# Patient Record
Sex: Male | Born: 1955 | Race: White | Hispanic: No | Marital: Single | Smoking: Former smoker
Health system: Southern US, Community
[De-identification: ages and names within clinical notes are randomized; demographics above are authoritative.]

## PROBLEM LIST (undated history)

## (undated) DIAGNOSIS — I2510 Atherosclerotic heart disease of native coronary artery without angina pectoris: Secondary | ICD-10-CM

## (undated) DIAGNOSIS — I219 Acute myocardial infarction, unspecified: Secondary | ICD-10-CM

## (undated) DIAGNOSIS — M069 Rheumatoid arthritis, unspecified: Secondary | ICD-10-CM

## (undated) DIAGNOSIS — E039 Hypothyroidism, unspecified: Secondary | ICD-10-CM

## (undated) DIAGNOSIS — I509 Heart failure, unspecified: Secondary | ICD-10-CM

## (undated) DIAGNOSIS — M1990 Unspecified osteoarthritis, unspecified site: Secondary | ICD-10-CM

## (undated) DIAGNOSIS — K219 Gastro-esophageal reflux disease without esophagitis: Secondary | ICD-10-CM

## (undated) HISTORY — DX: Acute myocardial infarction, unspecified: I21.9

## (undated) HISTORY — DX: Hypothyroidism, unspecified: E03.9

## (undated) HISTORY — DX: Rheumatoid arthritis, unspecified: M06.9

## (undated) HISTORY — PX: LEG SURGERY: SHX1003

## (undated) HISTORY — DX: Heart failure, unspecified: I50.9

---

## 2000-09-19 ENCOUNTER — Other Ambulatory Visit: Admission: RE | Admit: 2000-09-19 | Discharge: 2000-09-19 | Payer: Self-pay | Admitting: Internal Medicine

## 2005-07-30 ENCOUNTER — Emergency Department (HOSPITAL_COMMUNITY): Admission: EM | Admit: 2005-07-30 | Discharge: 2005-07-31 | Payer: Self-pay | Admitting: Emergency Medicine

## 2006-03-28 ENCOUNTER — Encounter: Admission: RE | Admit: 2006-03-28 | Discharge: 2006-03-28 | Payer: Self-pay | Admitting: Internal Medicine

## 2014-03-18 ENCOUNTER — Other Ambulatory Visit (HOSPITAL_COMMUNITY): Payer: Self-pay | Admitting: Internal Medicine

## 2014-03-18 DIAGNOSIS — R1310 Dysphagia, unspecified: Secondary | ICD-10-CM

## 2014-03-22 ENCOUNTER — Ambulatory Visit (HOSPITAL_COMMUNITY)
Admission: RE | Admit: 2014-03-22 | Discharge: 2014-03-22 | Disposition: A | Discharge status: Home / Self Care | Payer: BLUE CROSS/BLUE SHIELD | Source: Ambulatory Visit | Attending: Internal Medicine | Admitting: Internal Medicine

## 2014-03-22 DIAGNOSIS — R1310 Dysphagia, unspecified: Secondary | ICD-10-CM | POA: Insufficient documentation

## 2014-03-28 ENCOUNTER — Encounter: Payer: Self-pay | Admitting: Internal Medicine

## 2014-04-13 ENCOUNTER — Encounter: Payer: Self-pay | Admitting: Gastroenterology

## 2014-04-13 ENCOUNTER — Other Ambulatory Visit: Payer: Self-pay

## 2014-04-13 ENCOUNTER — Ambulatory Visit (INDEPENDENT_AMBULATORY_CARE_PROVIDER_SITE_OTHER): Payer: BLUE CROSS/BLUE SHIELD | Admitting: Gastroenterology

## 2014-04-13 VITALS — BP 156/61 | HR 71 | Temp 97.3°F | Ht 72.0 in | Wt 199.6 lb

## 2014-04-13 DIAGNOSIS — R1319 Other dysphagia: Secondary | ICD-10-CM

## 2014-04-13 DIAGNOSIS — Z1211 Encounter for screening for malignant neoplasm of colon: Secondary | ICD-10-CM

## 2014-04-13 DIAGNOSIS — R1310 Dysphagia, unspecified: Secondary | ICD-10-CM

## 2014-04-13 DIAGNOSIS — R1314 Dysphagia, pharyngoesophageal phase: Secondary | ICD-10-CM

## 2014-04-13 DIAGNOSIS — K222 Esophageal obstruction: Secondary | ICD-10-CM

## 2014-04-13 MED ORDER — PEG 3350-KCL-NA BICARB-NACL 420 G PO SOLR: 4000.0000 mL | Freq: Once | ORAL | Status: DC

## 2014-04-13 NOTE — Assessment & Plan Note (Signed)
59 year old gentleman with history of solid food esophageal dysphagia with impactions at times. Barium pill esophagram showed benign distal esophageal stricture. Recommend EGD with dilation in the near future.  I have discussed the risks, alternatives, benefits with regards to but not limited to the risk of reaction to medication, bleeding, infection, perforation and the patient is agreeable to proceed. Written consent to be obtained.  Continue pantoprazole 40 mg for now. In the meantime consume soft diet.

## 2014-04-13 NOTE — Patient Instructions (Signed)
1. Colonoscopy and upper endoscopy with Dr. Rourk. Please see separate instructions.  

## 2014-04-13 NOTE — Assessment & Plan Note (Signed)
Due for first ever screening colonoscopy. Complains of rare bright red blood per rectum which he feels is hemorrhoid related.  I have discussed the risks, alternatives, benefits with regards to but not limited to the risk of reaction to medication, bleeding, infection, perforation and the patient is agreeable to proceed. Written consent to be obtained.

## 2014-04-13 NOTE — Progress Notes (Signed)
cc'ed to pcp °
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
## 2014-05-11 ENCOUNTER — Encounter (HOSPITAL_COMMUNITY)
Admission: RE | Disposition: A | Discharge status: Home / Self Care | Payer: Self-pay | Source: Ambulatory Visit | Attending: Internal Medicine

## 2014-05-11 ENCOUNTER — Encounter (HOSPITAL_COMMUNITY): Payer: Self-pay | Admitting: *Deleted

## 2014-05-11 ENCOUNTER — Ambulatory Visit (HOSPITAL_COMMUNITY)
Admission: RE | Admit: 2014-05-11 | Discharge: 2014-05-11 | Disposition: A | Discharge status: Home / Self Care | Payer: BLUE CROSS/BLUE SHIELD | Source: Ambulatory Visit | Attending: Internal Medicine | Admitting: Internal Medicine

## 2014-05-11 DIAGNOSIS — K2100 Gastro-esophageal reflux disease with esophagitis, without bleeding: Secondary | ICD-10-CM | POA: Insufficient documentation

## 2014-05-11 DIAGNOSIS — Z1211 Encounter for screening for malignant neoplasm of colon: Secondary | ICD-10-CM | POA: Diagnosis not present

## 2014-05-11 DIAGNOSIS — Z860100 Personal history of colon polyps, unspecified: Secondary | ICD-10-CM | POA: Insufficient documentation

## 2014-05-11 DIAGNOSIS — K5730 Diverticulosis of large intestine without perforation or abscess without bleeding: Secondary | ICD-10-CM | POA: Insufficient documentation

## 2014-05-11 DIAGNOSIS — R1310 Dysphagia, unspecified: Secondary | ICD-10-CM

## 2014-05-11 DIAGNOSIS — K449 Diaphragmatic hernia without obstruction or gangrene: Secondary | ICD-10-CM | POA: Insufficient documentation

## 2014-05-11 DIAGNOSIS — Z87891 Personal history of nicotine dependence: Secondary | ICD-10-CM | POA: Insufficient documentation

## 2014-05-11 DIAGNOSIS — K621 Rectal polyp: Secondary | ICD-10-CM

## 2014-05-11 DIAGNOSIS — M069 Rheumatoid arthritis, unspecified: Secondary | ICD-10-CM | POA: Diagnosis not present

## 2014-05-11 DIAGNOSIS — K222 Esophageal obstruction: Secondary | ICD-10-CM | POA: Diagnosis not present

## 2014-05-11 DIAGNOSIS — K219 Gastro-esophageal reflux disease without esophagitis: Secondary | ICD-10-CM | POA: Diagnosis present

## 2014-05-11 DIAGNOSIS — R1319 Other dysphagia: Secondary | ICD-10-CM

## 2014-05-11 HISTORY — PX: COLONOSCOPY: SHX5424

## 2014-05-11 HISTORY — PX: ESOPHAGOGASTRODUODENOSCOPY: SHX5428

## 2014-05-11 HISTORY — PX: ESOPHAGEAL DILATION: SHX303

## 2014-05-11 SURGERY — COLONOSCOPY
Anesthesia: Moderate Sedation

## 2014-05-11 MED ORDER — LIDOCAINE VISCOUS 2 % MT SOLN
OROMUCOSAL | Status: DC | PRN
  Administered 2014-05-11: 3 mL via OROMUCOSAL

## 2014-05-11 MED ORDER — MEPERIDINE HCL 100 MG/ML IJ SOLN
INTRAMUSCULAR | Status: DC | PRN
  Administered 2014-05-11: 25 mg via INTRAVENOUS
  Administered 2014-05-11: 50 mg via INTRAVENOUS

## 2014-05-11 MED ORDER — MIDAZOLAM HCL 5 MG/5ML IJ SOLN
INTRAMUSCULAR | Status: DC | PRN
  Administered 2014-05-11: 1 mg via INTRAVENOUS
  Administered 2014-05-11: 2 mg via INTRAVENOUS
  Administered 2014-05-11 (×2): 1 mg via INTRAVENOUS
  Administered 2014-05-11: 2 mg via INTRAVENOUS

## 2014-05-11 MED ORDER — ONDANSETRON HCL 4 MG/2ML IJ SOLN
INTRAMUSCULAR | Status: AC
  Filled 2014-05-11: qty 2

## 2014-05-11 MED ORDER — LIDOCAINE VISCOUS 2 % MT SOLN
OROMUCOSAL | Status: AC
  Filled 2014-05-11: qty 15

## 2014-05-11 MED ORDER — MIDAZOLAM HCL 5 MG/5ML IJ SOLN
INTRAMUSCULAR | Status: AC
  Filled 2014-05-11: qty 10

## 2014-05-11 MED ORDER — ONDANSETRON HCL 4 MG/2ML IJ SOLN
INTRAMUSCULAR | Status: DC | PRN
  Administered 2014-05-11: 4 mg via INTRAVENOUS

## 2014-05-11 MED ORDER — MEPERIDINE HCL 100 MG/ML IJ SOLN
INTRAMUSCULAR | Status: AC
  Filled 2014-05-11: qty 2

## 2014-05-11 MED ORDER — SODIUM CHLORIDE 0.9 % IV SOLN
INTRAVENOUS | Status: DC
Start: 2014-05-11 — End: 2014-05-11
  Administered 2014-05-11: 1000 mL via INTRAVENOUS
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
## 2014-05-11 NOTE — Discharge Instructions (Signed)
Colonoscopy Discharge Instructions  Read the instructions outlined below and refer to this sheet in the next few weeks. These discharge instructions provide you with general information on caring for yourself after you leave the hospital. Your doctor may also give you specific instructions. While your treatment has been planned according to the most current medical practices available, unavoidable complications occasionally occur. If you have any problems or questions after discharge, call Dr. Gala Romney at [phone]. ACTIVITY  You may resume your regular activity, but move at a slower pace for the next 24 hours.   Take frequent rest periods for the next 24 hours.   Walking will help get rid of the air and reduce the bloated feeling in your belly (abdomen).   No driving for 24 hours (because of the medicine (anesthesia) used during the test).    Do not sign any important legal documents or operate any machinery for 24 hours (because of the anesthesia used during the test).  NUTRITION  Drink plenty of fluids.   You may resume your normal diet as instructed by your doctor.   Begin with a light meal and progress to your normal diet. Heavy or fried foods are harder to digest and may make you feel sick to your stomach (nauseated).   Avoid alcoholic beverages for 24 hours or as instructed.  MEDICATIONS  You may resume your normal medications unless your doctor tells you otherwise.  WHAT YOU CAN EXPECT TODAY  Some feelings of bloating in the abdomen.   Passage of more gas than usual.   Spotting of blood in your stool or on the toilet paper.  IF YOU HAD POLYPS REMOVED DURING THE COLONOSCOPY:  No aspirin products for 7 days or as instructed.   No alcohol for 7 days or as instructed.   Eat a soft diet for the next 24 hours.  FINDING OUT THE RESULTS OF YOUR TEST Not all test results are available during your visit. If your test results are not back during the visit, make an appointment  with your caregiver to find out the results. Do not assume everything is normal if you have not heard from your caregiver or the medical facility. It is important for you to follow up on all of your test results.  SEEK IMMEDIATE MEDICAL ATTENTION IF:  You have more than a spotting of blood in your stool.   Your belly is swollen (abdominal distention).   You are nauseated or vomiting.   You have a temperature over 101.  You have abdominal pain or discomfort that is severe or gets worse throughout the day. EGD Discharge instructions Please read the instructions outlined below and refer to this sheet in the next few weeks. These discharge instructions provide you with general information on caring for yourself after you leave the hospital. Your doctor may also give you specific instructions. While your treatment has been planned according to the most current medical practices available, unavoidable complications occasionally occur. If you have any problems or questions after discharge, please call your doctor. ACTIVITY You may resume your regular activity but move at a slower pace for the next 24 hours.  Take frequent rest periods for the next 24 hours.  Walking will help expel (get rid of) the air and reduce the bloated feeling in your abdomen.  No driving for 24 hours (because of the anesthesia (medicine) used during the test).  You may shower.  Do not sign any important legal documents or operate any machinery for 24  hours (because of the anesthesia used during the test).  NUTRITION Drink plenty of fluids.  You may resume your normal diet.  Begin with a light meal and progress to your normal diet.  Avoid alcoholic beverages for 24 hours or as instructed by your caregiver.  MEDICATIONS You may resume your normal medications unless your caregiver tells you otherwise.  WHAT YOU CAN EXPECT TODAY You may experience abdominal discomfort such as a feeling of fullness or gas pains.   FOLLOW-UP Your doctor will discuss the results of your test with you.  SEEK IMMEDIATE MEDICAL ATTENTION IF ANY OF THE FOLLOWING OCCUR: Excessive nausea (feeling sick to your stomach) and/or vomiting.  Severe abdominal pain and distention (swelling).  Trouble swallowing.  Temperature over 101 F (37.8 C).  Rectal bleeding or vomiting of blood.     You had 2 small polyps removed during your colonoscopy  You have severe acid reflux disease with stricture narrowing in your esophagus which I dilated. You need to start a medication called Protonix 40 mg daily. Your stomach was also inflamed.  Further recommendations to follow once I get the biopsy report back in about a week to 10 days.  Office visit with [place] in 3 months  GERD information provided.    Gastroesophageal Reflux Disease, Adult Gastroesophageal reflux disease (GERD) happens when acid from your stomach flows up into the esophagus. When acid comes in contact with the esophagus, the acid causes soreness (inflammation) in the esophagus. Over time, GERD may create small holes (ulcers) in the lining of the esophagus. CAUSES   Increased body weight. This puts pressure on the stomach, making acid rise from the stomach into the esophagus.  Smoking. This increases acid production in the stomach.  Drinking alcohol. This causes decreased pressure in the lower esophageal sphincter (valve or ring of muscle between the esophagus and stomach), allowing acid from the stomach into the esophagus.  Late evening meals and a full stomach. This increases pressure and acid production in the stomach.  A malformed lower esophageal sphincter. Sometimes, no cause is found. SYMPTOMS   Burning pain in the lower part of the mid-chest behind the breastbone and in the mid-stomach area. This may occur twice a week or more often.  Trouble swallowing.  Sore throat.  Dry cough.  Asthma-like symptoms including chest tightness, shortness of breath, or  wheezing. DIAGNOSIS  Your caregiver may be able to diagnose GERD based on your symptoms. In some cases, X-rays and other tests may be done to check for complications or to check the condition of your stomach and esophagus. TREATMENT  Your caregiver may recommend over-the-counter or prescription medicines to help decrease acid production. Ask your caregiver before starting or adding any new medicines.  HOME CARE INSTRUCTIONS   Change the factors that you can control. Ask your caregiver for guidance concerning weight loss, quitting smoking, and alcohol consumption.  Avoid foods and drinks that make your symptoms worse, such as:  Caffeine or alcoholic drinks.  Chocolate.  Peppermint or mint flavorings.  Garlic and onions.  Spicy foods.  Citrus fruits, such as oranges, lemons, or limes.  Tomato-based foods such as sauce, chili, salsa, and pizza.  Fried and fatty foods.  Avoid lying down for the 3 hours prior to your bedtime or prior to taking a nap.  Eat small, frequent meals instead of large meals.  Wear loose-fitting clothing. Do not wear anything tight around your waist that causes pressure on your stomach.  Raise the head of your  bed 6 to 8 inches with wood blocks to help you sleep. Extra pillows will not help.  Only take over-the-counter or prescription medicines for pain, discomfort, or fever as directed by your caregiver.  Do not take aspirin, ibuprofen, or other nonsteroidal anti-inflammatory drugs (NSAIDs). SEEK IMMEDIATE MEDICAL CARE IF:   You have pain in your arms, neck, jaw, teeth, or back.  Your pain increases or changes in intensity or duration.  You develop nausea, vomiting, or sweating (diaphoresis).  You develop shortness of breath, or you faint.  Your vomit is green, yellow, black, or looks like coffee grounds or blood.  Your stool is red, bloody, or black. These symptoms could be signs of other problems, such as heart disease, gastric bleeding, or  esophageal bleeding. MAKE SURE YOU:   Understand these instructions.  Will watch your condition.  Will get help right away if you are not doing well or get worse. Document Released: 10/31/2004 Document Revised: 04/15/2011 Document Reviewed: 08/10/2010 [facility] Patient Information 2015 [place], [place]. This information is not intended to replace advice given to you by your health care provider. Make sure you discuss any questions you have with your health care provider.   Colon Polyps Polyps are lumps of extra tissue growing inside the body. Polyps can grow in the large intestine (colon). Most colon polyps are noncancerous (benign). However, some colon polyps can become cancerous over time. Polyps that are larger than a pea may be harmful. To be safe, caregivers remove and test all polyps. CAUSES  Polyps form when mutations in the genes cause your cells to grow and divide even though no more tissue is needed. RISK FACTORS There are a number of risk factors that can increase your chances of getting colon polyps. They include:  Being older than 50 years.  Family history of colon polyps or colon cancer.  Long-term colon diseases, such as colitis or Crohn disease.  Being overweight.  Smoking.  Being inactive.  Drinking too much alcohol. SYMPTOMS  Most small polyps do not cause symptoms. If symptoms are present, they may include:  Blood in the stool. The stool may look dark red or black.  Constipation or diarrhea that lasts longer than 1 week. DIAGNOSIS People often do not know they have polyps until their caregiver finds them during a regular checkup. Your caregiver can use 4 tests to check for polyps:  Digital rectal exam. The caregiver wears gloves and feels inside the rectum. This test would find polyps only in the rectum.  Barium enema. The caregiver puts a liquid called barium into your rectum before taking X-rays of your colon. Barium makes your colon look white. Polyps  are dark, so they are easy to see in the X-ray pictures.  Sigmoidoscopy. A thin, flexible tube (sigmoidoscope) is placed into your rectum. The sigmoidoscope has a light and tiny camera in it. The caregiver uses the sigmoidoscope to look at the last third of your colon.  Colonoscopy. This test is like sigmoidoscopy, but the caregiver looks at the entire colon. This is the most common method for finding and removing polyps. TREATMENT  Any polyps will be removed during a sigmoidoscopy or colonoscopy. The polyps are then tested for cancer. PREVENTION  To help lower your risk of getting more colon polyps:  Eat plenty of fruits and vegetables. Avoid eating fatty foods.  Do not smoke.  Avoid drinking alcohol.  Exercise every day.  Lose weight if recommended by your caregiver.  Eat plenty of calcium and folate. Foods  that are rich in calcium include milk, cheese, and broccoli. Foods that are rich in folate include chickpeas, kidney beans, and spinach. HOME CARE INSTRUCTIONS Keep all follow-up appointments as directed by your caregiver. You may need periodic exams to check for polyps. SEEK MEDICAL CARE IF: You notice bleeding during a bowel movement. Document Released: 10/18/2003 Document Revised: 04/15/2011 Document Reviewed: 04/02/2011 [facility] Patient Information 2015 [place], [place]. This information is not intended to replace advice given to you by your health care provider. Make sure you discuss any questions you have with your health care provider.

## 2014-05-11 NOTE — Interval H&P Note (Signed)
History and Physical Interval Note:  05/11/2014 11:04 AM  Brendan [name]  has presented today for surgery, with the diagnosis of esophageal dysphagia, screening TCS, Esphogeal stricture  The various methods of treatment have been discussed with the patient and family. After consideration of risks, benefits and other options for treatment, the patient has consented to  Procedure(s) with comments: COLONOSCOPY (N/A) - 1045am - moved to 11:00 - Ginger to notify pt ESOPHAGOGASTRODUODENOSCOPY (EGD) (N/A) ESOPHAGEAL DILATION (N/A) as a surgical intervention .  The patient's history has been reviewed, patient examined, no change in status, stable for surgery.  I have reviewed the patient's chart and labs.  Questions were answered to the patient's satisfaction.     [name]  No change. Abnormal barium pill esophagram. EGD with esophageal dilation as appropriate followed by screening colonoscopy.  The risks, benefits, limitations, imponderables and alternatives regarding both EGD and colonoscopy have been reviewed with the patient. Questions have been answered. All parties agreeable.

## 2014-05-11 NOTE — Op Note (Signed)
[facility] [address] [place], [address]   COLONOSCOPY PROCEDURE REPORT  PATIENT: Brendan [name], Brendan [name]  MR#: [id number] BIRTHDATE: 09-28-1955 , 58  yrs. old GENDER: male ENDOSCOPIST: R.  Roetta Sessions, MD FACP [place] REFERRED QQ:IWLNL Sherwood Gambler, M.D. PROCEDURE DATE:  07-Jun-2014 PROCEDURE:   Colonoscopy with biopsy INDICATIONS:   first-ever average risk colorectal cancer screening examination. MEDICATIONS: Versed 7 mg IV and Demerol 75 mg IV in divided doses. Zofran 4 mg IV. ASA CLASS:       Class II  CONSENT: The risks, benefits, alternatives and imponderables including but not limited to bleeding, perforation as well as the possibility of a missed lesion have been reviewed.  The potential for biopsy, lesion removal, etc. have also been discussed. Questions have been answered.  All parties agreeable.  Please see the history and physical in the medical record for more information.  DESCRIPTION OF PROCEDURE:   After the risks benefits and alternatives of the procedure were thoroughly explained, informed consent was obtained.  The digital rectal exam revealed no abnormalities of the rectum.   The EG-2990i ([id number])  endoscope was introduced through the anus and advanced to the cecum, which was identified by both the appendix and ileocecal valve. No adverse events experienced.   The quality of the prep was adequate  The instrument was then slowly withdrawn as the colon was fully examined.      COLON FINDINGS: 2 distal diminutive rectal polyp; otherwise the rectal mucosa appeared normal.  Single everted descending colon diverticulum; otherwise, the remainder of the colonic mucosa appeared normal.  The 2 above-mentioned polyps were cold biopsy/removed.  Retroflexion was performed. .  Withdrawal time=10 minutes 0 seconds.  The scope was withdrawn and the procedure completed. COMPLICATIONS: There were no immediate complications.  ENDOSCOPIC IMPRESSION: Rectal  polyps?"removed as described above. Single colonic diverticulum  RECOMMENDATIONS: Follow-up on pathology. See EGD report.  eSigned:  R. Roetta Sessions, MD Jerrel Ivory [facility] 2014/06/07 11:55 AM   cc:  CPT CODES: ICD CODES:  The ICD and CPT codes recommended by this software are interpretations from the data that the clinical staff has captured with the software.  The verification of the translation of this report to the ICD and CPT codes and modifiers is the sole responsibility of the health care institution and practicing physician where this report was generated.  [employer] will not be held responsible for the validity of the ICD and CPT codes included on this report.  AMA assumes no liability for data contained or not contained herein. CPT is a Publishing rights manager of the [employer].

## 2014-05-11 NOTE — Op Note (Signed)
[facility] [address] [place], [address]   ENDOSCOPY PROCEDURE REPORT  PATIENT: [name], Brendan  MR#: [id number] BIRTHDATE: [birth year] , 58  yrs. old GENDER: male ENDOSCOPIST: R.  Roetta Sessions, MD FACP FACG REFERRED BY:  Artis Delay, M.D. PROCEDURE DATE:  30-May-2014 PROCEDURE:  EGD w/ biopsy and Maloney dilation of esophagus INDICATIONS:  Long-standing GERD; esophageal dysphagia; stricture at GE junction on BP E. MEDICATIONS: Versed 4 mg IV and Demerol 75 mg IV in divided doses. Xylocaine gel orally.  Zofran 4 mg IV. ASA CLASS:      Class II  CONSENT: The risks, benefits, limitations, alternatives and imponderables have been discussed.  The potential for biopsy, esophogeal dilation, etc. have also been reviewed.  Questions have been answered.  All parties agreeable.  Please see the history and physical in the medical record for more information.  DESCRIPTION OF PROCEDURE: After the risks benefits and alternatives of the procedure were thoroughly explained, informed consent was obtained.  The EG-2990i ([id number]) endoscope was introduced through the mouth and advanced to the second portion of the duodenum , limited by Without limitations. The instrument was slowly withdrawn as the mucosa was fully examined.    Distal esophageal ring with superimposed inflammatory component consistent with a stricture; distal esophageal erosions present. No tumor.  No Barrett's esophagus.  EG junction easily traversed with mild resistance.  Stomach empty.  3 cm hiatal hernia. Multiple antral and body erosions with patchy erythema throughout the stomach.  No ulcer or infiltrating process.  Patent pylorus. Examination of the first and second portion of the duodenum revealed a erosions straddling the junction of the first and second portion of the duodenum.  No ulcer or neoplasm seen.  Scope was withdrawn and a 54 [place] Maloney dilators passed to full insertion with mild  resistance.  A look back revealed the ring/stricture had been dilated nicely without apparent complication.  Minimal bleeding.  Subsequently, biopsies of the abnormal gastric mucosa taken for histologic study.  Retroflexed views revealed a hiatal hernia.     The scope was then withdrawn from the patient and the procedure completed.  COMPLICATIONS: There were no immediate complications.  ENDOSCOPIC IMPRESSION: Esophageal ring/ peptic stricture. Reflux esophagitis. Status post [facility] dilation. Hiatal hernia. Gastric and duodenal erosions?"status post gastric biopsy  RECOMMENDATIONS: Begin Protonix 40 mg daily. See colonoscopy report. Follow up on pathology.  REPEAT EXAM:  eSigned:  R. Roetta Sessions, MD Jerrel Ivory [facility] 30-May-2014 11:37 AM    CC:  CPT CODES: ICD CODES:  The ICD and CPT codes recommended by this software are interpretations from the data that the clinical staff has captured with the software.  The verification of the translation of this report to the ICD and CPT codes and modifiers is the sole responsibility of the health care institution and practicing physician where this report was generated.  [employer] will not be held responsible for the validity of the ICD and CPT codes included on this report.  AMA assumes no liability for data contained or not contained herein. CPT is a Publishing rights manager of the [employer].  PATIENT NAME:  [name], [name] MR#: [id number]

## 2014-05-12 ENCOUNTER — Encounter (HOSPITAL_COMMUNITY): Payer: Self-pay | Admitting: Internal Medicine

## 2014-05-15 ENCOUNTER — Encounter: Payer: Self-pay | Admitting: Internal Medicine

## 2014-08-15 ENCOUNTER — Ambulatory Visit: Payer: BLUE CROSS/BLUE SHIELD | Admitting: Gastroenterology

## 2014-08-19 ENCOUNTER — Ambulatory Visit (INDEPENDENT_AMBULATORY_CARE_PROVIDER_SITE_OTHER): Payer: BLUE CROSS/BLUE SHIELD | Admitting: Gastroenterology

## 2014-08-19 ENCOUNTER — Encounter: Payer: Self-pay | Admitting: Gastroenterology

## 2014-08-19 VITALS — BP 146/85 | HR 71 | Temp 98.4°F | Ht 72.0 in | Wt 201.8 lb

## 2014-08-19 DIAGNOSIS — K2100 Gastro-esophageal reflux disease with esophagitis, without bleeding: Secondary | ICD-10-CM

## 2014-08-19 NOTE — Assessment & Plan Note (Signed)
59 year old gentleman with recent EGD for abnormal barium pill esophagram/dysphagia. Noted to have an esophageal ring/peptic stricture, reflux esophagitis. Patient took Protonix for one month only. Has been feeling well and did not think he needed to continue the medication. Swallowing issues completely resolved post dilation. Recommend resuming Protonix 40 mg daily. If he decides to come off the medication, would wait at least 3 months before trial. If he has heartburn more than 3-4 times weekly, recommend daily PPI therapy. If he develops recurrent esophageal dysphagia he will require lifelong PPI therapy. He will return to the office in one year or call sooner if any questions or concerns.

## 2014-08-19 NOTE — Progress Notes (Signed)
      Primary Care Physician: Cassell Smiles., MD  Primary Gastroenterologist:  Roetta Sessions, MD   Chief Complaint  Patient presents with  . Follow-up    HPI: Brendan [name] is a 59 y.o. male here follow-up of dysphagia, long-standing GERD. EGD in April. He was noted to have an esophageal ring/peptic stricture, reflux esophagitis, gastric and duodenal erosions. Gastric biopsy showed chronic gastritis but no H pylori. Started on Protonix 40 mg daily just before this EGD.  He also had his first ever colonoscopy with removal of 2 rectal polyps which were hyperplastic. Single colonic diverticulum noted. Next TCS in 10 years.   Patient 100% better. Took protonix for one month only. Only occasional heartburn. Dysphagia resolved. No abdominal pain. No constipation, diarrhea, melena, brbpr.   Current Outpatient Prescriptions  Medication Sig Dispense Refill  . hydroxychloroquine (PLAQUENIL) 200 MG tablet   1  . ORENCIA 125 MG/ML SOSY   2  . pantoprazole (PROTONIX) 40 MG tablet   10  . predniSONE (DELTASONE) 5 MG tablet   1  . polyethylene glycol-electrolytes (NULYTELY/GOLYTELY) 420 G solution Take 4,000 mLs by mouth once. (Patient not taking: Reported on 08/19/2014) 4000 mL 0   No current facility-administered medications for this visit.    Allergies as of 08/19/2014  . (No Known Allergies)    ROS:  General: Negative for anorexia, weight loss, fever, chills, fatigue, weakness. ENT: Negative for hoarseness, difficulty swallowing , nasal congestion. CV: Negative for chest pain, angina, palpitations, dyspnea on exertion, peripheral edema.  Respiratory: Negative for dyspnea at rest, dyspnea on exertion, cough, sputum, wheezing.  GI: See history of present illness. GU:  Negative for dysuria, hematuria, urinary incontinence, urinary frequency, nocturnal urination.  Endo: Negative for unusual weight change.    Physical Examination:   BP 146/85 mmHg  Pulse 71  Temp(Src) 98.4 F  (36.9 C) (Oral)  Ht 6' (1.829 m)  Wt 201 lb 12.8 oz (91.536 kg)  BMI 27.36 kg/m2  General: Well-nourished, well-developed in no acute distress.  Eyes: No icterus. Mouth: Oropharyngeal mucosa moist and pink , no lesions erythema or exudate. Abdomen: Bowel sounds are normal, nontender, nondistended, no hepatosplenomegaly or masses, no abdominal bruits or hernia , no rebound or guarding.   Extremities: No lower extremity edema. No clubbing or deformities. Neuro: Alert and oriented x 4   Skin: Warm and dry, no jaundice.   Psych: Alert and cooperative, normal mood and affect.

## 2014-08-19 NOTE — Patient Instructions (Signed)
1. Recommend continuing pantoprazole once daily but if you would like to try to come off the medication, you should wait until after completing 3 months of treatment.  2. Call if you have any questions or recurrent symptoms.  3. Return for office visit in 12 months.

## 2014-08-22 NOTE — Progress Notes (Signed)
cc'ed to pcp °

## 2015-07-12 ENCOUNTER — Encounter: Payer: Self-pay | Admitting: Internal Medicine

## 2016-03-06 ENCOUNTER — Other Ambulatory Visit (HOSPITAL_COMMUNITY): Payer: Self-pay | Admitting: Internal Medicine

## 2016-03-06 DIAGNOSIS — R0989 Other specified symptoms and signs involving the circulatory and respiratory systems: Secondary | ICD-10-CM

## 2016-03-13 ENCOUNTER — Ambulatory Visit (HOSPITAL_COMMUNITY)
Admission: RE | Admit: 2016-03-13 | Discharge: 2016-03-13 | Disposition: A | Discharge status: Home / Self Care | Payer: BLUE CROSS/BLUE SHIELD | Source: Ambulatory Visit | Attending: Internal Medicine | Admitting: Internal Medicine

## 2016-03-13 DIAGNOSIS — I6523 Occlusion and stenosis of bilateral carotid arteries: Secondary | ICD-10-CM | POA: Insufficient documentation

## 2016-03-13 DIAGNOSIS — R0989 Other specified symptoms and signs involving the circulatory and respiratory systems: Secondary | ICD-10-CM | POA: Diagnosis present

## 2018-01-15 ENCOUNTER — Emergency Department (HOSPITAL_COMMUNITY): Payer: BLUE CROSS/BLUE SHIELD

## 2018-01-15 ENCOUNTER — Encounter (HOSPITAL_COMMUNITY)
Admission: EM | Disposition: A | Discharge status: Home / Self Care | Payer: Self-pay | Source: Home / Self Care | Attending: Emergency Medicine

## 2018-01-15 ENCOUNTER — Encounter (HOSPITAL_COMMUNITY): Payer: Self-pay | Admitting: Emergency Medicine

## 2018-01-15 ENCOUNTER — Observation Stay (HOSPITAL_COMMUNITY)
Admission: EM | Admit: 2018-01-15 | Discharge: 2018-01-17 | Disposition: A | Discharge status: Home / Self Care | Payer: BLUE CROSS/BLUE SHIELD | Attending: Cardiovascular Disease | Admitting: Cardiovascular Disease

## 2018-01-15 ENCOUNTER — Other Ambulatory Visit: Payer: Self-pay

## 2018-01-15 DIAGNOSIS — Z8249 Family history of ischemic heart disease and other diseases of the circulatory system: Secondary | ICD-10-CM | POA: Insufficient documentation

## 2018-01-15 DIAGNOSIS — Z7982 Long term (current) use of aspirin: Secondary | ICD-10-CM | POA: Diagnosis not present

## 2018-01-15 DIAGNOSIS — I5021 Acute systolic (congestive) heart failure: Secondary | ICD-10-CM | POA: Insufficient documentation

## 2018-01-15 DIAGNOSIS — D696 Thrombocytopenia, unspecified: Secondary | ICD-10-CM | POA: Diagnosis not present

## 2018-01-15 DIAGNOSIS — I214 Non-ST elevation (NSTEMI) myocardial infarction: Principal | ICD-10-CM

## 2018-01-15 DIAGNOSIS — R945 Abnormal results of liver function studies: Secondary | ICD-10-CM | POA: Diagnosis not present

## 2018-01-15 DIAGNOSIS — E785 Hyperlipidemia, unspecified: Secondary | ICD-10-CM | POA: Insufficient documentation

## 2018-01-15 DIAGNOSIS — I420 Dilated cardiomyopathy: Secondary | ICD-10-CM | POA: Diagnosis not present

## 2018-01-15 DIAGNOSIS — M069 Rheumatoid arthritis, unspecified: Secondary | ICD-10-CM | POA: Diagnosis not present

## 2018-01-15 DIAGNOSIS — Z888 Allergy status to other drugs, medicaments and biological substances status: Secondary | ICD-10-CM | POA: Insufficient documentation

## 2018-01-15 DIAGNOSIS — I6523 Occlusion and stenosis of bilateral carotid arteries: Secondary | ICD-10-CM | POA: Insufficient documentation

## 2018-01-15 DIAGNOSIS — Z79899 Other long term (current) drug therapy: Secondary | ICD-10-CM | POA: Insufficient documentation

## 2018-01-15 DIAGNOSIS — I428 Other cardiomyopathies: Secondary | ICD-10-CM | POA: Diagnosis not present

## 2018-01-15 DIAGNOSIS — Z87891 Personal history of nicotine dependence: Secondary | ICD-10-CM

## 2018-01-15 DIAGNOSIS — R7989 Other specified abnormal findings of blood chemistry: Secondary | ICD-10-CM | POA: Insufficient documentation

## 2018-01-15 HISTORY — DX: Unspecified osteoarthritis, unspecified site: M19.90

## 2018-01-15 HISTORY — PX: LEFT HEART CATH AND CORONARY ANGIOGRAPHY: CATH118249

## 2018-01-15 LAB — CBC WITH DIFFERENTIAL/PLATELET
Abs Immature Granulocytes: 0.01 10*3/uL (ref 0.00–0.07)
BASOS ABS: 0 10*3/uL (ref 0.0–0.1)
Basophils Relative: 1 %
Eosinophils Absolute: 0.2 10*3/uL (ref 0.0–0.5)
Eosinophils Relative: 6 %
HCT: 45.8 % (ref 39.0–52.0)
HEMOGLOBIN: 15.9 g/dL (ref 13.0–17.0)
Immature Granulocytes: 0 %
LYMPHS ABS: 0.9 10*3/uL (ref 0.7–4.0)
Lymphocytes Relative: 34 %
MCH: 33.8 pg (ref 26.0–34.0)
MCHC: 34.7 g/dL (ref 30.0–36.0)
MCV: 97.4 fL (ref 80.0–100.0)
MONO ABS: 0.3 10*3/uL (ref 0.1–1.0)
MONOS PCT: 9 %
Neutro Abs: 1.4 10*3/uL — ABNORMAL LOW (ref 1.7–7.7)
Neutrophils Relative %: 50 %
PLATELETS: 100 10*3/uL — AB (ref 150–400)
RBC: 4.7 MIL/uL (ref 4.22–5.81)
RDW: 12 % (ref 11.5–15.5)
WBC: 2.8 10*3/uL — ABNORMAL LOW (ref 4.0–10.5)
nRBC: 0 % (ref 0.0–0.2)

## 2018-01-15 LAB — HEPATIC FUNCTION PANEL
ALT: 112 U/L — AB (ref 0–44)
AST: 52 U/L — AB (ref 15–41)
Albumin: 3.8 g/dL (ref 3.5–5.0)
Alkaline Phosphatase: 41 U/L (ref 38–126)
Bilirubin, Direct: <0.1 mg/dL (ref 0.0–0.2)
Total Bilirubin: 1 mg/dL (ref 0.3–1.2)
Total Protein: 6.2 g/dL — ABNORMAL LOW (ref 6.5–8.1)

## 2018-01-15 LAB — TROPONIN I
TROPONIN I: 0.49 ng/mL — AB (ref <0.03)
Troponin I: 0.53 ng/mL (ref <0.03)

## 2018-01-15 LAB — BASIC METABOLIC PANEL
Anion gap: 7 (ref 5–15)
BUN: 12 mg/dL (ref 6–20)
CALCIUM: 8.6 mg/dL — AB (ref 8.9–10.3)
CO2: 28 mmol/L (ref 22–32)
CREATININE: 0.95 mg/dL (ref 0.61–1.24)
Chloride: 103 mmol/L (ref 98–111)
GFR calc non Af Amer: >60 mL/min (ref >60)
Glucose, Bld: 120 mg/dL — ABNORMAL HIGH (ref 70–99)
POTASSIUM: 3.7 mmol/L (ref 3.5–5.1)
Sodium: 138 mmol/L (ref 135–145)

## 2018-01-15 LAB — HEMOGLOBIN A1C
Hgb A1c MFr Bld: 5.1 % (ref 4.8–5.6)
Mean Plasma Glucose: 99.67 mg/dL

## 2018-01-15 SURGERY — LEFT HEART CATH AND CORONARY ANGIOGRAPHY
Anesthesia: LOCAL

## 2018-01-15 MED ORDER — SODIUM CHLORIDE 0.9% FLUSH: 3.0000 mL | INTRAVENOUS | Status: DC | PRN

## 2018-01-15 MED ORDER — HEPARIN (PORCINE) 25000 UT/250ML-% IV SOLN
1300.0000 [IU]/h | INTRAVENOUS | Status: DC
  Administered 2018-01-15: 1300 [IU]/h via INTRAVENOUS
  Filled 2018-01-15: qty 250

## 2018-01-15 MED ORDER — FENTANYL CITRATE (PF) 100 MCG/2ML IJ SOLN
INTRAMUSCULAR | Status: DC | PRN
  Administered 2018-01-15: 25 ug via INTRAVENOUS

## 2018-01-15 MED ORDER — LIDOCAINE HCL (PF) 1 % IJ SOLN
INTRAMUSCULAR | Status: AC
  Filled 2018-01-15: qty 30

## 2018-01-15 MED ORDER — SODIUM CHLORIDE 0.9 % WEIGHT BASED INFUSION
1.0000 mL/kg/h | INTRAVENOUS | Status: DC
  Administered 2018-01-15: 1 mL/kg/h via INTRAVENOUS

## 2018-01-15 MED ORDER — HEPARIN SODIUM (PORCINE) 1000 UNIT/ML IJ SOLN
INTRAMUSCULAR | Status: DC | PRN
  Administered 2018-01-15: 4500 [IU] via INTRAVENOUS

## 2018-01-15 MED ORDER — HEPARIN BOLUS VIA INFUSION
4000.0000 [IU] | Freq: Once | INTRAVENOUS | Status: AC
  Administered 2018-01-15: 4000 [IU] via INTRAVENOUS

## 2018-01-15 MED ORDER — LIDOCAINE HCL (PF) 1 % IJ SOLN
INTRAMUSCULAR | Status: DC | PRN
  Administered 2018-01-15: 2 mL

## 2018-01-15 MED ORDER — SODIUM CHLORIDE 0.9 % IV SOLN: 250.0000 mL | INTRAVENOUS | Status: DC | PRN

## 2018-01-15 MED ORDER — CARVEDILOL 3.125 MG PO TABS
3.1250 mg | ORAL_TABLET | Freq: Two times a day (BID) | ORAL | Status: DC
  Administered 2018-01-15 – 2018-01-17 (×5): 3.125 mg via ORAL
  Filled 2018-01-15 (×5): qty 1

## 2018-01-15 MED ORDER — HEPARIN (PORCINE) IN NACL 1000-0.9 UT/500ML-% IV SOLN
INTRAVENOUS | Status: DC | PRN
  Administered 2018-01-15 (×2): 500 mL

## 2018-01-15 MED ORDER — ENOXAPARIN SODIUM 40 MG/0.4ML ~~LOC~~ SOLN
40.0000 mg | SUBCUTANEOUS | Status: DC
  Administered 2018-01-16 – 2018-01-17 (×2): 40 mg via SUBCUTANEOUS
  Filled 2018-01-15 (×2): qty 0.4

## 2018-01-15 MED ORDER — HEPARIN SODIUM (PORCINE) 1000 UNIT/ML IJ SOLN
INTRAMUSCULAR | Status: AC
  Filled 2018-01-15: qty 1

## 2018-01-15 MED ORDER — VERAPAMIL HCL 2.5 MG/ML IV SOLN
INTRAVENOUS | Status: DC | PRN
  Administered 2018-01-15: 18:00:00 via INTRA_ARTERIAL

## 2018-01-15 MED ORDER — MIDAZOLAM HCL 2 MG/2ML IJ SOLN
INTRAMUSCULAR | Status: DC | PRN
  Administered 2018-01-15: 1 mg via INTRAVENOUS

## 2018-01-15 MED ORDER — HEPARIN (PORCINE) IN NACL 1000-0.9 UT/500ML-% IV SOLN
INTRAVENOUS | Status: AC
  Filled 2018-01-15: qty 1000

## 2018-01-15 MED ORDER — NITROGLYCERIN 0.4 MG SL SUBL: 0.4000 mg | SUBLINGUAL_TABLET | SUBLINGUAL | Status: DC | PRN

## 2018-01-15 MED ORDER — SODIUM CHLORIDE 0.9 % WEIGHT BASED INFUSION
3.0000 mL/kg/h | INTRAVENOUS | Status: DC
  Administered 2018-01-15: 3 mL/kg/h via INTRAVENOUS

## 2018-01-15 MED ORDER — ASPIRIN EC 81 MG PO TBEC
81.0000 mg | DELAYED_RELEASE_TABLET | Freq: Every day | ORAL | Status: DC
  Administered 2018-01-15: 81 mg via ORAL
  Filled 2018-01-15: qty 1

## 2018-01-15 MED ORDER — ACETAMINOPHEN 325 MG PO TABS
650.0000 mg | ORAL_TABLET | ORAL | Status: DC | PRN
  Administered 2018-01-15: 650 mg via ORAL
  Filled 2018-01-15: qty 2

## 2018-01-15 MED ORDER — MIDAZOLAM HCL 2 MG/2ML IJ SOLN
INTRAMUSCULAR | Status: AC
  Filled 2018-01-15: qty 2

## 2018-01-15 MED ORDER — FENTANYL CITRATE (PF) 100 MCG/2ML IJ SOLN
INTRAMUSCULAR | Status: AC
  Filled 2018-01-15: qty 2

## 2018-01-15 MED ORDER — ONDANSETRON HCL 4 MG/2ML IJ SOLN: 4.0000 mg | Freq: Four times a day (QID) | INTRAMUSCULAR | Status: DC | PRN

## 2018-01-15 MED ORDER — VERAPAMIL HCL 2.5 MG/ML IV SOLN
INTRAVENOUS | Status: AC
  Filled 2018-01-15: qty 2

## 2018-01-15 MED ORDER — SODIUM CHLORIDE 0.9% FLUSH
3.0000 mL | Freq: Two times a day (BID) | INTRAVENOUS | Status: DC
  Administered 2018-01-15 – 2018-01-17 (×3): 3 mL via INTRAVENOUS

## 2018-01-15 MED ORDER — FUROSEMIDE 10 MG/ML IJ SOLN
20.0000 mg | Freq: Every day | INTRAMUSCULAR | Status: DC
  Administered 2018-01-15 – 2018-01-16 (×2): 20 mg via INTRAVENOUS
  Filled 2018-01-15 (×2): qty 2

## 2018-01-15 MED ORDER — IOHEXOL 350 MG/ML SOLN
INTRAVENOUS | Status: DC | PRN
  Administered 2018-01-15: 80 mL via INTRA_ARTERIAL

## 2018-01-15 MED ORDER — SODIUM CHLORIDE 0.9% FLUSH: 3.0000 mL | Freq: Two times a day (BID) | INTRAVENOUS | Status: DC

## 2018-01-15 MED ORDER — METOPROLOL TARTRATE 12.5 MG HALF TABLET: 12.5000 mg | ORAL_TABLET | Freq: Two times a day (BID) | ORAL | Status: DC

## 2018-01-15 SURGICAL SUPPLY — 10 items
CATH 5FR JL3.5 JR4 ANG PIG MP (CATHETERS) ×1 IMPLANT
DEVICE RAD COMP TR BAND LRG (VASCULAR PRODUCTS) ×1 IMPLANT
GLIDESHEATH SLEND SS 6F .021 (SHEATH) ×1 IMPLANT
GUIDEWIRE INQWIRE 1.5J.035X260 (WIRE) IMPLANT
INQWIRE 1.5J .035X260CM (WIRE) ×2
KIT HEART LEFT (KITS) ×2 IMPLANT
PACK CARDIAC CATHETERIZATION (CUSTOM PROCEDURE TRAY) ×2 IMPLANT
SYR MEDRAD MARK 7 150ML (SYRINGE) ×2 IMPLANT
TRANSDUCER W/STOPCOCK (MISCELLANEOUS) ×2 IMPLANT
TUBING CIL FLEX 10 FLL-RA (TUBING) ×2 IMPLANT

## 2018-01-15 NOTE — ED Notes (Signed)
CRITICAL VALUE ALERT  Critical Value:  Troponin 0.53  Date & Time Notied:  01/15/18 0841  Provider Notified: Dr. Estell Harpin  Orders Received/Actions taken: EDP notified, no further orders given

## 2018-01-15 NOTE — Brief Op Note (Signed)
BRIEF CARDIAC CATHETERIZATION NOTE  DATE: 01/15/2018 TIME: 6:13 PM  PATIENT:  Brendan [name]  62 y.o. male  PRE-OPERATIVE DIAGNOSIS:  NSTEMI  POST-OPERATIVE DIAGNOSIS:  Acute systolic heart failure  PROCEDURE:  Procedure(s): LEFT HEART CATH AND CORONARY ANGIOGRAPHY (N/A)  SURGEON:  Surgeon(s) and Role:    * Kadey Mihalic, MD - Primary  FINDINGS: 1. No significant CAD. 2. Severely reduced LVEF with global hypokinesis, consistent with NICM. 3. Moderately elevated LVEDP.  RECOMMENDATIONS: 1. Admit for optimization of heart failure. 2. Initiate furosemide 20 mg IV daily and carvedilol 3.125 mg BID. 3. Consider ACEI/ARB tomorrow, as renal function and blood pressure allow. 4. Consider cardiac MRI.  Yvonne Kendall, MD [facility] Pager: [phone]
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
## 2018-01-15 NOTE — ED Triage Notes (Signed)
PT states 4 days ago he was on an exercise bike and his chest became tight with SOB and diaphoresis. PT denies any chest pain at this time but states since the incident on Sunday he has felt lightheaded at times. PT went to Dr. Sherwood Gambler this am and was told to come to ED for quick testing.
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
## 2018-01-15 NOTE — Interval H&P Note (Signed)
History and Physical Interval Note:  01/15/2018 5:23 PM  Brendan [name]  has presented today for cardiac catheterization, with the diagnosis of NSTEMI. The various methods of treatment have been discussed with the patient and family. After consideration of risks, benefits and other options for treatment, the patient has consented to  Procedure(s): LEFT HEART CATH AND CORONARY ANGIOGRAPHY (N/A) as a surgical intervention .  The patient's history has been reviewed, patient examined, no change in status, stable for surgery.  I have reviewed the patient's chart and labs.  Questions were answered to the patient's satisfaction.    Cath Lab Visit (complete for each Cath Lab visit)  Clinical Evaluation Leading to the Procedure:   ACS: Yes.    Non-ACS:  N/A  Brendan [name]

## 2018-01-15 NOTE — Progress Notes (Signed)
ANTICOAGULATION CONSULT NOTE - Initial Consult  Pharmacy Consult for heparin infusion Indication: ACS/STEMI  Allergies  Allergen Reactions  . Simponi [Golimumab]     Itching, rash    Patient Measurements: Height: 6' (182.9 cm) Weight: 205 lb (93 kg) IBW/kg (Calculated) : 77.6 Heparin Dosing Weight: HEPARIN DW (KG): 93  Vital Signs: Temp: 97.9 F (36.6 C) (12/12 0736) Temp Source: Oral (12/12 0736) BP: 129/77 (12/12 0930) Pulse Rate: 63 (12/12 0930)  Labs: Recent Labs    01/15/18 0743  HGB 15.9  HCT 45.8  PLT 100*  CREATININE 0.95  TROPONINI 0.53*    Estimated Creatinine Clearance: 90.8 mL/min (by C-G formula based on SCr of 0.95 mg/dL).   Medical History: Past Medical History:  Diagnosis Date  . Osteoarthritis   . Rheumatoid arthritis (HCC)      Assessment: Pharmacy consulted to dose heparin infusion for this 62 yom with elevated troponin I.  Goal of Therapy:  Heparin level 0.3-0.7 units/ml Monitor platelets by anticoagulation protocol: Yes   Plan:  Give 4000 units bolus x 1 Start heparin infusion at 1300units/hr Check anti-Xa level in 6-8 hours and daily while on heparin Continue to monitor H&H and platelets  Tama High 01/15/2018,10:16 AM

## 2018-01-16 ENCOUNTER — Observation Stay (HOSPITAL_BASED_OUTPATIENT_CLINIC_OR_DEPARTMENT_OTHER): Payer: BLUE CROSS/BLUE SHIELD

## 2018-01-16 ENCOUNTER — Encounter (HOSPITAL_COMMUNITY): Payer: Self-pay | Admitting: Internal Medicine

## 2018-01-16 DIAGNOSIS — I214 Non-ST elevation (NSTEMI) myocardial infarction: Secondary | ICD-10-CM

## 2018-01-16 DIAGNOSIS — I5023 Acute on chronic systolic (congestive) heart failure: Secondary | ICD-10-CM | POA: Diagnosis not present

## 2018-01-16 LAB — BASIC METABOLIC PANEL
Anion gap: 10 (ref 5–15)
BUN: 10 mg/dL (ref 6–20)
CO2: 23 mmol/L (ref 22–32)
Calcium: 8.7 mg/dL — ABNORMAL LOW (ref 8.9–10.3)
Chloride: 106 mmol/L (ref 98–111)
Creatinine, Ser: 0.95 mg/dL (ref 0.61–1.24)
GFR calc Af Amer: >60 mL/min (ref >60)
GFR calc non Af Amer: >60 mL/min (ref >60)
Glucose, Bld: 109 mg/dL — ABNORMAL HIGH (ref 70–99)
Potassium: 4.2 mmol/L (ref 3.5–5.1)
Sodium: 139 mmol/L (ref 135–145)

## 2018-01-16 LAB — LIPID PANEL
Cholesterol: 197 mg/dL (ref 0–200)
HDL: 44 mg/dL (ref >40)
LDL Cholesterol: 130 mg/dL — ABNORMAL HIGH (ref 0–99)
Total CHOL/HDL Ratio: 4.5 RATIO
Triglycerides: 114 mg/dL (ref <150)
VLDL: 23 mg/dL (ref 0–40)

## 2018-01-16 LAB — CBC
HEMATOCRIT: 44.2 % (ref 39.0–52.0)
Hemoglobin: 15.3 g/dL (ref 13.0–17.0)
MCH: 32.8 pg (ref 26.0–34.0)
MCHC: 34.6 g/dL (ref 30.0–36.0)
MCV: 94.6 fL (ref 80.0–100.0)
Platelets: 108 10*3/uL — ABNORMAL LOW (ref 150–400)
RBC: 4.67 MIL/uL (ref 4.22–5.81)
RDW: 11.9 % (ref 11.5–15.5)
WBC: 2.6 10*3/uL — ABNORMAL LOW (ref 4.0–10.5)
nRBC: 0 % (ref 0.0–0.2)

## 2018-01-16 LAB — HEPATIC FUNCTION PANEL
ALT: 103 U/L — AB (ref 0–44)
AST: 46 U/L — ABNORMAL HIGH (ref 15–41)
Albumin: 3.7 g/dL (ref 3.5–5.0)
Alkaline Phosphatase: 38 U/L (ref 38–126)
Bilirubin, Direct: 0.2 mg/dL (ref 0.0–0.2)
Indirect Bilirubin: 0.8 mg/dL (ref 0.3–0.9)
Total Bilirubin: 1 mg/dL (ref 0.3–1.2)
Total Protein: 6.1 g/dL — ABNORMAL LOW (ref 6.5–8.1)

## 2018-01-16 LAB — ECHOCARDIOGRAM COMPLETE
Height: 72 in
Weight: 3404.8 oz

## 2018-01-16 LAB — T4, FREE: Free T4: 0.84 ng/dL (ref 0.82–1.77)

## 2018-01-16 LAB — TSH: TSH: 5.755 u[IU]/mL — ABNORMAL HIGH (ref 0.350–4.500)

## 2018-01-16 LAB — HIV ANTIBODY (ROUTINE TESTING W REFLEX): HIV Screen 4th Generation wRfx: NONREACTIVE

## 2018-01-16 MED ORDER — LOSARTAN POTASSIUM 25 MG PO TABS
25.0000 mg | ORAL_TABLET | Freq: Every day | ORAL | Status: DC
  Administered 2018-01-16: 25 mg via ORAL
  Filled 2018-01-16: qty 1

## 2018-01-16 MED ORDER — SACUBITRIL-VALSARTAN 24-26 MG PO TABS
1.0000 | ORAL_TABLET | Freq: Two times a day (BID) | ORAL | Status: DC
  Administered 2018-01-16 – 2018-01-17 (×2): 1 via ORAL
  Filled 2018-01-16 (×3): qty 1

## 2018-01-16 NOTE — Progress Notes (Signed)
  Echocardiogram 2D Echocardiogram has been performed.  Brendan [name] 01/16/2018, 9:48 AM

## 2018-01-16 NOTE — Progress Notes (Addendum)
The patient has been seen in conjunction with Marjie Skiff, PAC. All aspects of care have been considered and discussed. The patient has been personally interviewed, examined, and all clinical data has been reviewed.   Would recommend starting Entresto instead of losartan.  Beta-blocker therapy has already been started.  Up titration of therapy as outpatient.  Follow kidney function closely.  Diuretic therapy should potentially be with low-dose mineralocorticoid receptor blocker.  Progress Note  Patient Name: Brendan [name] Date of Encounter: 01/16/2018  Primary Cardiologist: No primary care provider on file.   Subjective   No significant overnight events. Patient denies any chest pain, shortness of breath, orthopnea, or edema. He has no complaints at this time and wants to go home.   Inpatient Medications    Scheduled Meds: . carvedilol  3.125 mg Oral BID WC  . enoxaparin (LOVENOX) injection  40 mg Subcutaneous Q24H  . furosemide  20 mg Intravenous Daily  . sodium chloride flush  3 mL Intravenous Q12H   Continuous Infusions: . sodium chloride     PRN Meds: sodium chloride, acetaminophen, nitroGLYCERIN, ondansetron (ZOFRAN) IV, sodium chloride flush   Vital Signs    Vitals:   01/15/18 1830 01/15/18 2019 01/15/18 2048 01/16/18 0500  BP: 140/85 (!) 146/80 133/72 125/78  Pulse: 61 74 69   Resp: 20 18 15 13   Temp: 97.8 F (36.6 C)   97.7 F (36.5 C)  TempSrc: Oral   Oral  SpO2: 97% 97% 98% 97%  Weight: 97.7 kg   96.5 kg  Height: 6' (1.829 m)       Intake/Output Summary (Last 24 hours) at 01/16/2018 0954 Last data filed at 01/15/2018 2229 Gross per 24 hour  Intake 1032.51 ml  Output 2340 ml  Net -1307.49 ml   Filed Weights   01/15/18 0737 01/15/18 1830 01/16/18 0500  Weight: 93 kg 97.7 kg 96.5 kg    Telemetry    Sinus rhythm with occasional PVCs. - Personally Reviewed  ECG    No new EKG tracing today. - Personally Reviewed  Physical Exam    GEN: No acute distress.   Neck: Supple. No JVD. Cardiac: RRR. No murmurs, rubs, or gallops. Right radial cath site soft with no signs of hematoma. Respiratory: Clear to auscultation bilaterally. No wheezes, rhonchi, or rales. GI: Abdomen soft, non-tender, non-distended  MS: No lower extremity edema. Neuro: No focal deficits. Psych: Normal affect.  Labs    Chemistry Recent Labs  Lab 01/15/18 0743 01/16/18 0604  NA 138 139  K 3.7 4.2  CL 103 106  CO2 28 23  GLUCOSE 120* 109*  BUN 12 10  CREATININE 0.95 0.95  CALCIUM 8.6* 8.7*  PROT 6.2*  --   ALBUMIN 3.8  --   AST 52*  --   ALT 112*  --   ALKPHOS 41  --   BILITOT 1.0  --   GFRNONAA >60 >60  GFRAA >60 >60  ANIONGAP 7 10     Hematology Recent Labs  Lab 01/15/18 0743 01/16/18 0604  WBC 2.8* 2.6*  RBC 4.70 4.67  HGB 15.9 15.3  HCT 45.8 44.2  MCV 97.4 94.6  MCH 33.8 32.8  MCHC 34.7 34.6  RDW 12.0 11.9  PLT 100* 108*    Cardiac Enzymes Recent Labs  Lab 01/15/18 0743 01/15/18 1109  TROPONINI 0.53* 0.49*   No results for input(s): TROPIPOC in the last 168 hours.   BNPNo results for input(s): BNP, PROBNP in the last 168 hours.  DDimer No results for input(s): DDIMER in the last 168 hours.   Radiology    Dg Chest 2 View  Result Date: 01/15/2018 CLINICAL DATA:  Episode of chest tightness, shortness of breath and diaphoresis while on an exercise bike 4 days ago, with intermittent lightheadedness since that time. Former smoker. EXAM: CHEST - 2 VIEW COMPARISON:  None. FINDINGS: Cardiac silhouette upper normal in size to slightly enlarged. Thoracic aorta mildly atherosclerotic. Hilar and mediastinal contours otherwise unremarkable. Lungs clear. Bronchovascular markings normal. Pulmonary vascularity normal. No visible pleural effusions. No pneumothorax. Visualized bony thorax intact. IMPRESSION: Borderline heart size. No acute cardiopulmonary disease. Electronically Signed   By: Hulan Saas M.D.   On:  01/15/2018 08:50    Cardiac Studies   Left Heart Catheterization 01/15/2018: Conclusions: 1. No angiographically significant coronary artery disease. 2. Severely reduced left ventricular systolic function consistent with non-ischemic cardiomyopathy (LVEF ~25-30%). 3. Moderately elevated left ventricular filling pressure (LVEDP 25-30 mmHg).  Recommendations: 1. Admit for optimization of heart failure. 2. Initiate furosemide 20 mg IV daily and carvedilol 3.125 mg twice daily. 3. Consider ACEI/ARB tomorrow, as renal function and blood pressure allow. 4. Consider cardiac MRI.  Patient Profile     ANDRES VEST is a 62 y.o. male with past medical history of RA, thrombocytopenia, prior tobacco use, and no prior cardiac history who presented to [facility] ED on 01/15/2018 for evaluation of chest pain. He is being evaluated at the request of Dr. Estell Harpin.  Assessment & Plan    NSTEMI - EKG showed normal sinus rhythm with T wave inversions in lateral leads. - Troponin peaked at 0.53.  - Left heart catheterization showed no significant CAD with severely reduced LV systolic function with EF of 25-30%.  - Echo pending.  - Continue Coreg 3.125mg  daily. - Will add Losartan 25mg  daily given reduced systolic function. - Chest pain and elevated troponin likely demand ischemia in the setting of reduced systolic function.  Nonischemic Cardiomyopathy - Left heart cath showed LVEF of 25-30%. - Echo pending. - Good urine outpatient since starting Lasix yesterday. Net output of 1.3 L. - Will discontinue IV Lasix. Will likely need PO Lasix as needed at discharge.  - Continue Coreg and Losartan as above.  - Pending Echo results, consider transitioning to [facility] prior to discharge.   Hyperlipidemia - Lipid panel this admission: Cholesterol 197, Triglycerides 114, HDL 44, LDL 130. - AST and ALT elevated at 52 and 112 respectively. - Will recheck hepatic function. If AST and ALT are trending down,  consider starting Lipitor 40mg  daily.  Elevated TSH - TSH 5.755 - Will check free T4 and free &T3.   For questions or updates, please contact [facility] Please consult [email or web address] for contact info under        Signed, [facility], PA-C  01/16/2018, 9:54 AM

## 2018-01-16 NOTE — Care Management (Signed)
5.   S/W HOLLY  @ [employer] # [phone]  ENTRESTO  24-26 MG  BID COVER- YES CO-PAY- ZERO DOLLARS PRIOR APPROVAL- NO  PREFERRED PHARMACY  : YES [facility] [place] [facility]

## 2018-01-17 ENCOUNTER — Telehealth: Payer: Self-pay | Admitting: Internal Medicine

## 2018-01-17 DIAGNOSIS — I428 Other cardiomyopathies: Secondary | ICD-10-CM

## 2018-01-17 DIAGNOSIS — I5043 Acute on chronic combined systolic (congestive) and diastolic (congestive) heart failure: Secondary | ICD-10-CM

## 2018-01-17 LAB — T3, FREE: T3, Free: 3.5 pg/mL (ref 2.0–4.4)

## 2018-01-17 MED ORDER — NITROGLYCERIN 0.4 MG SL SUBL: 0.4000 mg | SUBLINGUAL_TABLET | SUBLINGUAL | 0 refills | Status: DC | PRN

## 2018-01-17 MED ORDER — SACUBITRIL-VALSARTAN 24-26 MG PO TABS: 1.0000 | ORAL_TABLET | Freq: Two times a day (BID) | ORAL | 2 refills | Status: DC

## 2018-01-17 MED ORDER — CARVEDILOL 3.125 MG PO TABS: 3.1250 mg | ORAL_TABLET | Freq: Two times a day (BID) | ORAL | 4 refills | Status: DC

## 2018-01-17 NOTE — Discharge Summary (Addendum)
Discharge Summary    Patient ID: Brendan [name] MRN: [id number]; DOB: [birth year]  Admit date: 01/15/2018 Discharge date: 01/17/2018  Primary Care Provider: Elfredia Nevins, MD  Primary Cardiologist: Dr. Katrinka Blazing  Discharge Diagnoses    Principal Problem:   Non-ischemic cardiomyopathy [facility]) Active Problems:   NSTEMI (non-ST elevated myocardial infarction) (HCC)  Allergies Allergies  Allergen Reactions  . Simponi [Golimumab]     Itching, rash   Diagnostic Studies/Procedures   Cardiac catheterization 01/15/2018: Conclusions: 1. No angiographically significant coronary artery disease. 2. Severely reduced left ventricular systolic function consistent with non-ischemic cardiomyopathy (LVEF ~25-30%). 3. Moderately elevated left ventricular filling pressure (LVEDP 25-30 mmHg).  Recommendations: 1. Admit for optimization of heart failure. 2. Initiate furosemide 20 mg IV daily and carvedilol 3.125 mg twice daily. 3. Consider ACEI/ARB tomorrow, as renal function and blood pressure allow. 4. Consider cardiac MRI.   Echocardiogram 01/16/2018: Study Conclusions  - Left ventricle: The cavity size was severely dilated. Wall   thickness was normal. Systolic function was severely reduced. The   estimated ejection fraction was in the range of 25% to 30%.   Diffuse hypokinesis. Calculated LV EF by 2D tracking ranges from   29-34%. Although no diagnostic regional wall motion abnormality   was identified, this possibility cannot be completely excluded on   the basis of this study. The tissue Doppler parameters were   abnormal. Findings consistent with left ventricular diastolic   dysfunction, indeterminate by grading. - Ventricular septum: Septal motion showed abnormal function and   dyssynergy. - Mitral valve: There was trivial regurgitation. - Right ventricle: Systolic function was low normal. - Right atrium: Central venous pressure (est): 8 mm Hg. - Atrial septum: No defect or  patent foramen ovale was identified. - Tricuspid valve: There was trivial regurgitation.  Impressions:  - Severely dilated LV with diffuse hypokinesis. Severely reduced LV   EF, confirmed with 2D tracking. Abnormal tissue doppler but   indeterminate diastolic dysfunction. No significant valve   disease.  History of Present Illness     Brendan [name] is a 62 y.o. male with past medical history of RA, thrombocytopenia, prior tobacco use, and no prior cardiac history who presented to [facility] ED on 01/15/2018 for evaluation of chest pain. He is being evaluated at the request of Dr. Estell Harpin.  Brendan [name] reports being in his usual state of health until this past Sunday when he was on his exercise bike and developed chest discomfort. He reports significant diaphoresis at that time and reports symptoms lasted for approximately 20 to 30 minutes. He experienced a mild discomfort along his precordial region since this event but denies any significant pain resembling his symptoms on Sunday. He denied any associated radiating pain, nausea, vomiting, or dyspnea. No recent orthopnea, PND, or lower extremity edema.  He is unaware of any personal history of CAD, cardiac arrhythmias, HTN, HLD, or Type 2 DM. No known family history of CAD. He is a former smoker but quit approximately 20 years ago  Reports social alcohol use and denies any recreational drug use.  Initial labs showed WBC 2.8, Hgb 15.9, platelets 100, Na+ 138, K+ 3.7, and creatinine 0.95.  AST 52 and ALT 112.  Initial troponin elevated to 0.53. CXR shows no acute cardiopulmonary abnormalities. EKG shows NSR, HR 72, with IVCD and TWI along lateral leads with no prior tracings available for comparison. He has maintained NSR on telemetry with frequent PVC's.   Hospital Course    Given his presenting symptoms,  plans were made for cardiac catheterization performed on 01/15/2018 which revealed no angiography significant coronary artery  disease, severely reduced LV systolic function consistent with nonischemic cardiomyopathy.  LVEF estimated at 25 to 30%.  Recommendations at that time were to optimize heart failure medical therapy.  Lasix 20 mg IV was initiated as well as carvedilol 3.125 mg twice daily.  Follow-up echocardiogram confirmed LV systolic dysfunction.  There was diffuse hypokinesis as well. He was started on Entresto 24-26 twice daily and carvedilol 3.125 mg twice daily with plans for up titration as tolerated in the outpatient setting.  He is currently not on Lasix at this time secondary to mild hypotension.  Can initiate this in the office if needed.  We will set up for [facility] visit in the next 7 to 10 days with message system with plans for our office to call the patient with date and time of appointment.  Cath site unremarkable.  Patient is ambulated without complication.  Creatinine 0.95   Other hospital problems include:   Hyperlipidemia - Lipid panel this admission: Cholesterol 197, Triglycerides 114, HDL 44, LDL 130. - AST and ALT elevated at 52 and 112 respectively. - Will recheck hepatic function. If AST and ALT are trending down, consider starting Lipitor 40mg  daily.  Elevated TSH - TSH 5.755 - Will check free T4 and free &T3.   Consultants: None  The patient has been seen and examined by Dr. who feels that he is stable and ready for discharge today, 01/17/2018. _____________  Discharge Vitals Blood pressure 111/61, pulse 75, temperature 97.6 F (36.4 C), temperature source Oral, resp. rate 16, height 6' (1.829 m), weight 93.6 kg, SpO2 100 %.  Filed Weights   01/15/18 1830 01/16/18 0500 01/17/18 0706  Weight: 97.7 kg 96.5 kg 93.6 kg   Labs & Radiologic Studies    CBC Recent Labs    01/15/18 0743 01/16/18 0604  WBC 2.8* 2.6*  NEUTROABS 1.4*  --   HGB 15.9 15.3  HCT 45.8 44.2  MCV 97.4 94.6  PLT 100* 108*   Basic Metabolic Panel Recent Labs    01/18/18 0743 01/16/18 0604  NA  138 139  K 3.7 4.2  CL 103 106  CO2 28 23  GLUCOSE 120* 109*  BUN 12 10  CREATININE 0.95 0.95  CALCIUM 8.6* 8.7*   Liver Function Tests Recent Labs    01/15/18 0743 01/16/18 1109  AST 52* 46*  ALT 112* 103*  ALKPHOS 41 38  BILITOT 1.0 1.0  PROT 6.2* 6.1*  ALBUMIN 3.8 3.7   Cardiac Enzymes Recent Labs    01/15/18 0743 01/15/18 1109  TROPONINI 0.53* 0.49*   Hemoglobin A1C Recent Labs    01/15/18 1109  HGBA1C 5.1   Fasting Lipid Panel Recent Labs    01/16/18 0604  CHOL 197  HDL 44  LDLCALC 130*  TRIG 114  CHOLHDL 4.5   Thyroid Function Tests Recent Labs    01/16/18 0604 01/16/18 1109  TSH 5.755*  --   T3FREE  --  3.5   _____________  Dg Chest 2 View  Result Date: 01/15/2018 CLINICAL DATA:  Episode of chest tightness, shortness of breath and diaphoresis while on an exercise bike 4 days ago, with intermittent lightheadedness since that time. Former smoker. EXAM: CHEST - 2 VIEW COMPARISON:  None. FINDINGS: Cardiac silhouette upper normal in size to slightly enlarged. Thoracic aorta mildly atherosclerotic. Hilar and mediastinal contours otherwise unremarkable. Lungs clear. Bronchovascular markings normal. Pulmonary vascularity normal. No visible  pleural effusions. No pneumothorax. Visualized bony thorax intact. IMPRESSION: Borderline heart size. No acute cardiopulmonary disease. Electronically Signed   By: Hulan Saas M.D.   On: 01/15/2018 08:50   Disposition   Pt is being discharged home today in good condition.  Follow-up Plans & Appointments    Follow-up Information    [facility] Follow up.   Why:  Our office will call you with the date and time of appointment.  Expect this appointment to be in the next 7 to 10 days. Contact information: [address] [place] [address] [phone]         Discharge Instructions    Call MD for:  difficulty breathing, headache  or visual disturbances   Complete by:  As directed    Call MD for:  extreme fatigue   Complete by:  As directed    Call MD for:  persistant dizziness or light-headedness   Complete by:  As directed    Call MD for:  persistant nausea and vomiting   Complete by:  As directed    Call MD for:  redness, tenderness, or signs of infection (pain, swelling, redness, odor or green/yellow discharge around incision site)   Complete by:  As directed    Call MD for:  severe uncontrolled pain   Complete by:  As directed    Call MD for:  temperature >100.4   Complete by:  As directed    Diet - low sodium heart healthy   Complete by:  As directed    Discharge instructions   Complete by:  As directed    No driving for 3 days. No lifting over 5 lbs for 1 week. No sexual activity for 1 week. Keep procedure site clean & dry. If you notice increased pain, swelling, bleeding or pus, call/return!  You may shower, but no soaking baths/hot tubs/pools for 1 week.   Increase activity slowly   Complete by:  As directed      Discharge Medications   Allergies as of 01/17/2018      Reactions   Simponi [golimumab]    Itching, rash      Medication List    TAKE these medications   aspirin EC 81 MG tablet Take 81 mg by mouth daily.   carvedilol 3.125 MG tablet Commonly known as:  COREG Take 1 tablet (3.125 mg total) by mouth 2 (two) times daily with a meal.   HYDROcodone-acetaminophen 5-325 MG tablet Commonly known as:  NORCO/VICODIN Take by mouth.   KEVZARA 200 MG/1. Soaj Generic drug:  Sarilumab Inject 200 mg into the skin every 14 (fourteen) days.   MEGARED OMEGA-3 KRILL OIL 500 MG Caps Take 1 tablet by mouth daily.   nitroGLYCERIN 0.4 MG SL tablet Commonly known as:  NITROSTAT Place 1 tablet (0.4 mg total) under the tongue every 5 (five) minutes x 3 doses as needed for chest pain.   predniSONE 5 MG tablet Commonly known as:  DELTASONE   Q-10 CO-ENZYME PO Take 1 tablet by mouth  daily.   sacubitril-valsartan 24-26 MG Commonly known as:  ENTRESTO Take 1 tablet by mouth 2 (two) times daily.        Acute coronary syndrome (MI, NSTEMI, STEMI, etc) this admission?: No.    Outstanding Labs/Studies   BMET   Duration of Discharge Encounter   Greater than 30 minutes including physician time.  Signed, Georgie Chard, NP 01/17/2018, 3:39 PM  Attending Note:   The  patient was seen and examined.  Agree with assessment and plan as noted above.  Changes made to the above note as needed.  Patient seen and independently examined with  Lynda Rainwater., NP .   We discussed all aspects of the encounter. I agree with the assessment and plan as stated above.  1.  Acute on chronic combined systolic and diastolic congestive heart failure: The patient is been started on medications.  He is feeling quite a bit better. Halls without difficulty. Heart catheterization reveals no significant coronary artery disease. I have discussed restricting the salt in his diet.  Continue carvedilol 3.125 mg twice a day Entresto 24-26 mg twice a day He seems to be diuresing nicely on the [place].  He is not on Lasix at this time but his blood pressure is too low for [place] to start Lasix now. Can initiate this in the office if needed. Please send prescription sent to [facility] in [place], [place]  He will need a TOC visit in the next 7 to 10 days.  Follow-up with Dr. Katrinka Blazing or APP.    I have spent a total of 40 minutes with patient reviewing hospital  notes , telemetry, EKGs, labs and examining patient as well as establishing an assessment and plan that was discussed with the patient. > 50% of time was spent in direct patient care.    Vesta Mixer, Montez Hageman., MD, [facility] 01/18/2018, 7:57 PM [address],  [address] Office [phone] Pager [phone]

## 2018-01-17 NOTE — Progress Notes (Signed)
Progress Note  Patient Name: Brendan [name] Date of Encounter: 01/17/2018  Primary Cardiologist:  Katrinka Blazing   Subjective   62 year old gentleman with recent onset of shortness of breath, orthopnea and edema.  He was found to have acute on chronic combined systolic and diastolic congestive heart failure.  His ejection fraction is 25 to 30%.  Heart catheterization on December 12 reveals no significant coronary artery disease.  He has severely reduced left ventricular systolic function.  The left ventricular end-diastolic pressure was elevated around 25 to 30 mmHg.  He has been diuresed.  He has been started on Entresto 24-26 he is also on carvedilol 3.125 mg twice a day.-26 mg twice a day.  Feels great.  Wants to go home  Inpatient Medications    Scheduled Meds: . carvedilol  3.125 mg Oral BID WC  . enoxaparin (LOVENOX) injection  40 mg Subcutaneous Q24H  . sacubitril-valsartan  1 tablet Oral BID  . sodium chloride flush  3 mL Intravenous Q12H   Continuous Infusions: . sodium chloride     PRN Meds: sodium chloride, acetaminophen, nitroGLYCERIN, ondansetron (ZOFRAN) IV, sodium chloride flush   Vital Signs    Vitals:   01/16/18 1626 01/16/18 2119 01/17/18 0105 01/17/18 0706  BP: 132/77 104/79  111/61  Pulse: 64 63 64 75  Resp:  14 15 16   Temp:  97.6 F (36.4 C)  97.6 F (36.4 C)  TempSrc:  Oral  Oral  SpO2:  99% 98% 100%  Weight:    93.6 kg  Height:        Intake/Output Summary (Last 24 hours) at 01/17/2018 1346 Last data filed at 01/16/2018 2130 Gross per 24 hour  Intake 480 ml  Output -  Net 480 ml   Filed Weights   01/15/18 1830 01/16/18 0500 01/17/18 0706  Weight: 97.7 kg 96.5 kg 93.6 kg    Telemetry    NSR  - Personally Reviewed  ECG     NSR  - Personally Reviewed  Physical Exam   GEN: No acute distress.   Neck: No JVD Cardiac: RRR, no murmurs, rubs, or gallops.  Respiratory: Clear to auscultation bilaterally. GI: Soft, nontender,  non-distended  MS: No edema; No deformity. Neuro:  Nonfocal  Psych: Normal affect   Labs    Chemistry Recent Labs  Lab 01/15/18 0743 01/16/18 0604 01/16/18 1109  NA 138 139  --   K 3.7 4.2  --   CL 103 106  --   CO2 28 23  --   GLUCOSE 120* 109*  --   BUN 12 10  --   CREATININE 0.95 0.95  --   CALCIUM 8.6* 8.7*  --   PROT 6.2*  --  6.1*  ALBUMIN 3.8  --  3.7  AST 52*  --  46*  ALT 112*  --  103*  ALKPHOS 41  --  38  BILITOT 1.0  --  1.0  GFRNONAA >60 >60  --   GFRAA >60 >60  --   ANIONGAP 7 10  --      Hematology Recent Labs  Lab 01/15/18 0743 01/16/18 0604  WBC 2.8* 2.6*  RBC 4.70 4.67  HGB 15.9 15.3  HCT 45.8 44.2  MCV 97.4 94.6  MCH 33.8 32.8  MCHC 34.7 34.6  RDW 12.0 11.9  PLT 100* 108*    Cardiac Enzymes Recent Labs  Lab 01/15/18 0743 01/15/18 1109  TROPONINI 0.53* 0.49*   No results for input(s): TROPIPOC in the last  168 hours.   BNPNo results for input(s): BNP, PROBNP in the last 168 hours.   DDimer No results for input(s): DDIMER in the last 168 hours.   Radiology    No results found.  Cardiac Studies     Patient Profile     62 y.o. male admitted with CHF symptoms    Assessment & Plan    1.  Acute on chronic combined systolic and diastolic congestive heart failure: The patient is been started on medications.  He is feeling quite a bit better. Halls without difficulty. Heart catheterization reveals no significant coronary artery disease. I have discussed restricting the salt in his diet.  Continue carvedilol 3.125 mg twice a day Entresto 24-26 mg twice a day He seems to be diuresing nicely on the [place].  He is not on Lasix at this time but his blood pressure is too low for [place] to start Lasix now. Can initiate this in the office if needed. Please send prescription sent to [facility] in [place], [place]  He will need a TOC visit in the next 7 to 10 days.  Follow-up with Dr. Katrinka Blazing or APP.  For questions or  updates, please contact [facility] Please consult [email or web address] for contact info under        Signed, Kristeen Miss, MD  01/17/2018, 1:46 PM

## 2018-01-21 ENCOUNTER — Ambulatory Visit: Payer: BLUE CROSS/BLUE SHIELD | Admitting: Nurse Practitioner

## 2018-01-21 ENCOUNTER — Encounter: Payer: Self-pay | Admitting: Nurse Practitioner

## 2018-01-21 VITALS — BP 118/68 | HR 62 | Ht 72.0 in | Wt 212.0 lb

## 2018-01-21 DIAGNOSIS — I428 Other cardiomyopathies: Secondary | ICD-10-CM | POA: Diagnosis not present

## 2018-01-21 NOTE — Patient Instructions (Signed)
We will be checking the following labs today - BMET, CBC, HPF and TSH     Medication Instructions:    Continue with your current medicines.    If you need a refill on your cardiac medications before your next appointment, please call your pharmacy.     Testing/Procedures To Be Arranged:  N/A  Follow-Up:   See Herma Carson, PA in the [place] office in about 2 to 3 weeks    At [facility], you and your health needs are our priority.  As part of our continuing mission to provide you with exceptional heart care, we have created designated Provider Care Teams.  These Care Teams include your primary Cardiologist (physician) and Advanced Practice Providers (APPs -  Physician Assistants and Nurse Practitioners) who all work together to provide you with the care you need, when you need it.  Special Instructions:  . No alcohol . Restrict salt . Weigh daily - call us for weight gain over 3 pounds overnight  Call the [facility] office at [phone] if you have any questions, problems or concerns.
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
## 2018-01-22 ENCOUNTER — Telehealth: Payer: Self-pay | Admitting: Nurse Practitioner

## 2018-01-22 LAB — BASIC METABOLIC PANEL
BUN/Creatinine Ratio: 14 (ref 10–24)
BUN: 14 mg/dL (ref 8–27)
CO2: 23 mmol/L (ref 20–29)
Calcium: 9.6 mg/dL (ref 8.6–10.2)
Chloride: 102 mmol/L (ref 96–106)
Creatinine, Ser: 0.98 mg/dL (ref 0.76–1.27)
GFR calc Af Amer: 96 mL/min/{1.73_m2} (ref >59)
GFR calc non Af Amer: 83 mL/min/{1.73_m2} (ref >59)
Glucose: 84 mg/dL (ref 65–99)
Potassium: 5.1 mmol/L (ref 3.5–5.2)
Sodium: 141 mmol/L (ref 134–144)

## 2018-01-22 LAB — CBC
Hematocrit: 48.1 % (ref 37.5–51.0)
Hemoglobin: 16.6 g/dL (ref 13.0–17.7)
MCH: 33.4 pg — ABNORMAL HIGH (ref 26.6–33.0)
MCHC: 34.5 g/dL (ref 31.5–35.7)
MCV: 97 fL (ref 79–97)
Platelets: 144 10*3/uL — ABNORMAL LOW (ref 150–450)
RBC: 4.97 x10E6/uL (ref 4.14–5.80)
RDW: 12.5 % (ref 12.3–15.4)
WBC: 4.1 10*3/uL (ref 3.4–10.8)

## 2018-01-22 LAB — HEPATIC FUNCTION PANEL
ALT: 86 IU/L — ABNORMAL HIGH (ref 0–44)
AST: 36 IU/L (ref 0–40)
Albumin: 4.5 g/dL (ref 3.6–4.8)
Alkaline Phosphatase: 49 IU/L (ref 39–117)
Bilirubin Total: 0.5 mg/dL (ref 0.0–1.2)
Bilirubin, Direct: 0.12 mg/dL (ref 0.00–0.40)
Total Protein: 6.3 g/dL (ref 6.0–8.5)

## 2018-01-22 LAB — TSH: TSH: 5.86 u[IU]/mL — ABNORMAL HIGH (ref 0.450–4.500)

## 2018-01-22 NOTE — Telephone Encounter (Signed)
  Patient saw Norma Fredrickson on 01/21/18 and was told to watch his sodium and slalt intake. He is asking if he needs to be concerned about how much sodium is in things he buys from the grocery store.

## 2018-02-18 DIAGNOSIS — E785 Hyperlipidemia, unspecified: Secondary | ICD-10-CM | POA: Insufficient documentation
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
## 2018-02-23 DIAGNOSIS — E785 Hyperlipidemia, unspecified: Secondary | ICD-10-CM | POA: Diagnosis not present

## 2018-02-23 DIAGNOSIS — E039 Hypothyroidism, unspecified: Secondary | ICD-10-CM | POA: Diagnosis not present

## 2018-03-02 ENCOUNTER — Encounter: Payer: Self-pay | Admitting: Physician Assistant

## 2018-03-02 ENCOUNTER — Ambulatory Visit: Payer: BLUE CROSS/BLUE SHIELD | Admitting: Physician Assistant

## 2018-03-02 VITALS — BP 106/66 | HR 66 | Ht 72.0 in | Wt 218.0 lb

## 2018-03-02 DIAGNOSIS — I428 Other cardiomyopathies: Secondary | ICD-10-CM

## 2018-03-02 DIAGNOSIS — E785 Hyperlipidemia, unspecified: Secondary | ICD-10-CM | POA: Diagnosis not present

## 2018-03-02 NOTE — Patient Instructions (Signed)
Medication Instructions:  Your physician recommends that you continue on your current medications as directed. Please refer to the Current Medication list given to you today.  If you need a refill on your cardiac medications before your next appointment, please call your pharmacy.   Lab work: NONE  If you have labs (blood work) drawn today and your tests are completely normal, you will receive your results only by: Marland Kitchen MyChart Message (if you have MyChart) OR . A paper copy in the mail If you have any lab test that is abnormal or we need to change your treatment, we will call you to review the results.  Testing/Procedures: Your physician has requested that you have an echocardiogram. Echocardiography is a painless test that uses sound waves to create images of your heart. It provides your doctor with information about the size and shape of your heart and how well your heart's chambers and valves are working. This procedure takes approximately one hour. There are no restrictions for this procedure.    Follow-Up: At [facility], you and your health needs are our priority.  As part of our continuing mission to provide you with exceptional heart care, we have created designated Provider Care Teams.  These Care Teams include your primary Cardiologist (physician) and Advanced Practice Providers (APPs -  Physician Assistants and Nurse Practitioners) who all work together to provide you with the care you need, when you need it. You will need a follow up appointment in 2 months.  Please call our office 2 months in advance to schedule this appointment.  You may see No primary care provider on file. or one of the following Advanced Practice Providers on your designated Care Team:   [place], PA-C [facility]) . Jacolyn Reedy, PA-C [facility] Office)  Any Other Special Instructions Will Be Listed Below (If Applicable). Thank you for choosing [facility]!

## 2018-03-10 DIAGNOSIS — Z1389 Encounter for screening for other disorder: Secondary | ICD-10-CM | POA: Diagnosis not present

## 2018-03-10 DIAGNOSIS — Z6829 Body mass index (BMI) 29.0-29.9, adult: Secondary | ICD-10-CM | POA: Diagnosis not present

## 2018-03-10 DIAGNOSIS — I214 Non-ST elevation (NSTEMI) myocardial infarction: Secondary | ICD-10-CM | POA: Diagnosis not present

## 2018-03-10 DIAGNOSIS — E7849 Other hyperlipidemia: Secondary | ICD-10-CM | POA: Diagnosis not present

## 2018-03-10 DIAGNOSIS — M0589 Other rheumatoid arthritis with rheumatoid factor of multiple sites: Secondary | ICD-10-CM | POA: Diagnosis not present

## 2018-03-11 DIAGNOSIS — Z1389 Encounter for screening for other disorder: Secondary | ICD-10-CM | POA: Diagnosis not present

## 2018-03-11 DIAGNOSIS — E7849 Other hyperlipidemia: Secondary | ICD-10-CM | POA: Diagnosis not present

## 2018-03-11 DIAGNOSIS — E785 Hyperlipidemia, unspecified: Secondary | ICD-10-CM | POA: Diagnosis not present

## 2018-03-12 ENCOUNTER — Other Ambulatory Visit: Payer: Self-pay | Admitting: Cardiovascular Disease

## 2018-03-12 NOTE — Telephone Encounter (Signed)
This is a [place] pt.  °

## 2018-03-15 ENCOUNTER — Other Ambulatory Visit: Payer: Self-pay | Admitting: Cardiovascular Disease

## 2018-04-06 ENCOUNTER — Ambulatory Visit (HOSPITAL_COMMUNITY)
Admission: RE | Admit: 2018-04-06 | Discharge: 2018-04-06 | Disposition: A | Discharge status: Home / Self Care | Payer: Medicare HMO | Source: Ambulatory Visit | Attending: Physician Assistant | Admitting: Physician Assistant

## 2018-04-06 DIAGNOSIS — I428 Other cardiomyopathies: Secondary | ICD-10-CM | POA: Diagnosis not present

## 2018-04-06 NOTE — Progress Notes (Signed)
*  PRELIMINARY RESULTS* Echocardiogram 2D Echocardiogram has been performed.  Brendan [name] 04/06/2018, 10:22 AM

## 2018-04-16 DIAGNOSIS — M0579 Rheumatoid arthritis with rheumatoid factor of multiple sites without organ or systems involvement: Secondary | ICD-10-CM | POA: Diagnosis not present

## 2018-04-17 DIAGNOSIS — M0579 Rheumatoid arthritis with rheumatoid factor of multiple sites without organ or systems involvement: Secondary | ICD-10-CM | POA: Diagnosis not present

## 2018-04-17 DIAGNOSIS — Z79899 Other long term (current) drug therapy: Secondary | ICD-10-CM | POA: Diagnosis not present

## 2018-04-17 DIAGNOSIS — M0609 Rheumatoid arthritis without rheumatoid factor, multiple sites: Secondary | ICD-10-CM | POA: Diagnosis not present

## 2018-04-21 NOTE — Telephone Encounter (Signed)
Encounter inadvertently entered. Please disregard.

## 2018-05-12 ENCOUNTER — Other Ambulatory Visit: Payer: Self-pay | Admitting: Cardiovascular Disease

## 2018-06-25 ENCOUNTER — Telehealth: Payer: Self-pay | Admitting: Cardiology

## 2018-06-25 NOTE — Telephone Encounter (Signed)
Virtual Visit Pre-Appointment Phone Call  "(Name), I am calling you today to discuss your upcoming appointment. We are currently trying to limit exposure to the virus that causes COVID-19 by seeing patients at home rather than in the office."  1. "What is the BEST phone number to call the day of the visit?" - include this in appointment notes  2. Do you have or have access to (through a family member/friend) a smartphone with video capability that we can use for your visit?" a. If yes - list this number in appt notes as cell (if different from BEST phone #) and list the appointment type as a VIDEO visit in appointment notes b. If no - list the appointment type as a PHONE visit in appointment notes  Confirm consent - "In the setting of the current Covid19 crisis, you are scheduled for a (phone or video) visit with your provider on (date) at (time).  Just as we do with many in-office visits, in order for you to participate in this visit, we must obtain consent.  If you'd like, I can send this to your mychart (if signed up) or email for you to review.  Otherwise, I can obtain your verbal consent now.  All virtual visits are billed to your insurance company just like a normal visit would be.  By agreeing to a virtual visit, we'd like you to understand that the technology does not allow for your provider to perform an examination, and thus may limit your provider's ability to fully assess your condition. If your provider identifies any concerns that need to be evaluated in person, we will make arrangements to do so.  Finally, though the technology is pretty good, we cannot assure that it will always work on either your or our end, and in the setting of a video visit, we may have to convert it to a phone-only visit.  In either situation, we cannot ensure that we have a secure connection.  Are you willing to proceed?" STAFF: Did the patient verbally acknowledge consent to telehealth visit? Document  YES/NO here: Yes  3. Advise patient to be prepared - "Two hours prior to your appointment, go ahead and check your blood pressure, pulse, oxygen saturation, and your weight (if you have the equipment to check those) and write them all down. When your visit starts, your provider will ask you for this information. If you have an Apple Watch or Kardia device, please plan to have heart rate information ready on the day of your appointment. Please have a pen and paper handy nearby the day of the visit as well."  4. Give patient instructions for MyChart download to smartphone OR [email or web address] as below if video visit (depending on what platform provider is using)  5. Inform patient they will receive a phone call 15 minutes prior to their appointment time (may be from unknown caller ID) so they should be prepared to answer    TELEPHONE CALL NOTE  Brendan [name] has been deemed a candidate for a follow-up tele-health visit to limit community exposure during the Covid-19 pandemic. I spoke with the patient via phone to ensure availability of phone/video source, confirm preferred email & phone number, and discuss instructions and expectations.  I reminded Brendan [name] to be prepared with any vital sign and/or heart rhythm information that could potentially be obtained via home monitoring, at the time of his visit. I reminded Brendan [name] to expect a phone call prior to his  visit.  [name] 06/25/2018 10:45 AM

## 2018-07-01 ENCOUNTER — Other Ambulatory Visit: Payer: Self-pay

## 2018-07-01 ENCOUNTER — Telehealth (INDEPENDENT_AMBULATORY_CARE_PROVIDER_SITE_OTHER): Payer: Medicare HMO | Admitting: Cardiology

## 2018-07-01 ENCOUNTER — Encounter: Payer: Self-pay | Admitting: Cardiology

## 2018-07-01 VITALS — Ht 72.0 in | Wt 226.0 lb

## 2018-07-01 DIAGNOSIS — I2510 Atherosclerotic heart disease of native coronary artery without angina pectoris: Secondary | ICD-10-CM

## 2018-07-01 DIAGNOSIS — I5022 Chronic systolic (congestive) heart failure: Secondary | ICD-10-CM

## 2018-07-01 MED ORDER — SPIRONOLACTONE 25 MG PO TABS: 12.5000 mg | ORAL_TABLET | Freq: Every day | ORAL | 3 refills | Status: DC

## 2018-07-01 NOTE — Progress Notes (Signed)
Medication Instructions:  Start ALDACTONE 12.5 MG DAILY ( 1/2 TABLET )   Labwork: 2 WEEKS  CMET   Testing/Procedures: NONE  Follow-Up: Your physician recommends that you schedule a follow-up appointment in: 6 WEEKS    Any Other Special Instructions Will Be Listed Below (If Applicable).     If you need a refill on your cardiac medications before your next appointment, please call your pharmacy.

## 2018-07-01 NOTE — Addendum Note (Signed)
Addended by: Abelino Derrick R on: 07/01/2018 01:34 PM   Modules accepted: Orders
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
## 2018-07-04 ENCOUNTER — Other Ambulatory Visit: Payer: Self-pay | Admitting: Cardiology

## 2018-07-13 ENCOUNTER — Other Ambulatory Visit: Payer: Self-pay | Admitting: Cardiology

## 2018-07-13 DIAGNOSIS — I2510 Atherosclerotic heart disease of native coronary artery without angina pectoris: Secondary | ICD-10-CM | POA: Diagnosis not present

## 2018-07-13 DIAGNOSIS — I5022 Chronic systolic (congestive) heart failure: Secondary | ICD-10-CM | POA: Diagnosis not present

## 2018-07-14 DIAGNOSIS — R69 Illness, unspecified: Secondary | ICD-10-CM | POA: Diagnosis not present

## 2018-07-14 LAB — COMPREHENSIVE METABOLIC PANEL
ALT: 38 IU/L (ref 0–44)
AST: 26 IU/L (ref 0–40)
Albumin/Globulin Ratio: 2.5 — ABNORMAL HIGH (ref 1.2–2.2)
Albumin: 4.2 g/dL (ref 3.8–4.9)
Alkaline Phosphatase: 43 IU/L (ref 39–117)
BUN/Creatinine Ratio: 13 (ref 10–24)
BUN: 14 mg/dL (ref 8–27)
Bilirubin Total: 0.6 mg/dL (ref 0.0–1.2)
CO2: 25 mmol/L (ref 20–29)
Calcium: 9 mg/dL (ref 8.6–10.2)
Chloride: 104 mmol/L (ref 96–106)
Creatinine, Ser: 1.09 mg/dL (ref 0.76–1.27)
GFR calc Af Amer: 85 mL/min/{1.73_m2} (ref >59)
GFR calc non Af Amer: 73 mL/min/{1.73_m2} (ref >59)
Globulin, Total: 1.7 g/dL (ref 1.5–4.5)
Glucose: 109 mg/dL — ABNORMAL HIGH (ref 65–99)
Potassium: 4.9 mmol/L (ref 3.5–5.2)
Sodium: 143 mmol/L (ref 134–144)
Total Protein: 5.9 g/dL — ABNORMAL LOW (ref 6.0–8.5)

## 2018-08-10 ENCOUNTER — Other Ambulatory Visit: Payer: Self-pay

## 2018-08-10 ENCOUNTER — Ambulatory Visit (INDEPENDENT_AMBULATORY_CARE_PROVIDER_SITE_OTHER): Payer: Medicare HMO | Admitting: Cardiology

## 2018-08-10 ENCOUNTER — Encounter: Payer: Self-pay | Admitting: Cardiology

## 2018-08-10 VITALS — BP 106/58 | HR 61 | Temp 96.7°F | Ht 72.0 in | Wt 227.0 lb

## 2018-08-10 DIAGNOSIS — I428 Other cardiomyopathies: Secondary | ICD-10-CM

## 2018-08-10 DIAGNOSIS — I5022 Chronic systolic (congestive) heart failure: Secondary | ICD-10-CM

## 2018-08-10 MED ORDER — CARVEDILOL 3.125 MG PO TABS: ORAL_TABLET | ORAL | 3 refills | Status: DC

## 2018-08-10 NOTE — Patient Instructions (Signed)
Medication Instructions:  INCREASE COREG TO 6.25 IN THE EVENING ( 2 TABLETS)   Labwork: NONE  Testing/Procedures: NONE  Follow-Up: Your physician recommends that you schedule a follow-up appointment in: 3 MONTHS    Any Other Special Instructions Will Be Listed Below (If Applicable).     If you need a refill on your cardiac medications before your next appointment, please call your pharmacy.
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
## 2018-09-22 DIAGNOSIS — L308 Other specified dermatitis: Secondary | ICD-10-CM | POA: Diagnosis not present

## 2018-09-22 DIAGNOSIS — L218 Other seborrheic dermatitis: Secondary | ICD-10-CM | POA: Diagnosis not present

## 2018-10-26 DIAGNOSIS — Z79899 Other long term (current) drug therapy: Secondary | ICD-10-CM | POA: Diagnosis not present

## 2018-10-26 DIAGNOSIS — M1990 Unspecified osteoarthritis, unspecified site: Secondary | ICD-10-CM | POA: Diagnosis not present

## 2018-10-26 DIAGNOSIS — Z23 Encounter for immunization: Secondary | ICD-10-CM | POA: Diagnosis not present

## 2018-10-28 DIAGNOSIS — M1990 Unspecified osteoarthritis, unspecified site: Secondary | ICD-10-CM | POA: Diagnosis not present

## 2018-10-28 DIAGNOSIS — Z79899 Other long term (current) drug therapy: Secondary | ICD-10-CM | POA: Diagnosis not present

## 2018-11-11 DIAGNOSIS — R69 Illness, unspecified: Secondary | ICD-10-CM | POA: Diagnosis not present

## 2018-11-30 ENCOUNTER — Ambulatory Visit (INDEPENDENT_AMBULATORY_CARE_PROVIDER_SITE_OTHER): Payer: Medicare HMO | Admitting: Cardiology

## 2018-11-30 ENCOUNTER — Encounter: Payer: Self-pay | Admitting: Cardiology

## 2018-11-30 ENCOUNTER — Other Ambulatory Visit: Payer: Self-pay

## 2018-11-30 VITALS — BP 114/69 | HR 53 | Temp 96.8°F | Ht 72.0 in | Wt 226.0 lb

## 2018-11-30 DIAGNOSIS — Z79899 Other long term (current) drug therapy: Secondary | ICD-10-CM | POA: Diagnosis not present

## 2018-11-30 DIAGNOSIS — I5022 Chronic systolic (congestive) heart failure: Secondary | ICD-10-CM | POA: Diagnosis not present

## 2018-11-30 MED ORDER — ENTRESTO 49-51 MG PO TABS: 1.0000 | ORAL_TABLET | Freq: Two times a day (BID) | ORAL | 3 refills | Status: DC

## 2018-11-30 NOTE — Patient Instructions (Signed)
Medication Instructions:  Increase ENTRESTO TO 49-51 MG TWO TIMES DAILY   Labwork: 2 WEEKS   Testing/Procedures: NONE  Follow-Up: Your physician recommends that you schedule a follow-up appointment in: [place]  Your physician recommends that you schedule a follow-up appointment in: 6 WEEKS     Any Other Special Instructions Will Be Listed Below (If Applicable).     If you need a refill on your cardiac medications before your next appointment, please call your pharmacy.
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
## 2018-12-14 ENCOUNTER — Other Ambulatory Visit: Payer: Self-pay

## 2018-12-14 ENCOUNTER — Telehealth: Payer: Self-pay

## 2018-12-14 ENCOUNTER — Encounter: Payer: Self-pay | Admitting: Gastroenterology

## 2018-12-14 ENCOUNTER — Other Ambulatory Visit: Payer: Self-pay | Admitting: Cardiology

## 2018-12-14 ENCOUNTER — Ambulatory Visit: Payer: Medicare HMO | Admitting: Gastroenterology

## 2018-12-14 ENCOUNTER — Ambulatory Visit (INDEPENDENT_AMBULATORY_CARE_PROVIDER_SITE_OTHER): Payer: Medicare HMO

## 2018-12-14 VITALS — BP 115/70 | HR 61 | Temp 97.0°F | Ht 72.0 in | Wt 225.2 lb

## 2018-12-14 VITALS — BP 109/64 | HR 58 | Temp 97.3°F | Wt 225.0 lb

## 2018-12-14 DIAGNOSIS — I428 Other cardiomyopathies: Secondary | ICD-10-CM

## 2018-12-14 DIAGNOSIS — R1310 Dysphagia, unspecified: Secondary | ICD-10-CM

## 2018-12-14 DIAGNOSIS — R1319 Other dysphagia: Secondary | ICD-10-CM

## 2018-12-14 DIAGNOSIS — Z79899 Other long term (current) drug therapy: Secondary | ICD-10-CM | POA: Diagnosis not present

## 2018-12-14 NOTE — Progress Notes (Signed)
Patient arrives for BP check after increase in [place]. States he feels well. Had MD apt earlier today and said BP was 115/70 at that office.He had labs done today at [facility].
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
## 2018-12-14 NOTE — Patient Instructions (Signed)
We will call you with any changes in medication after Dr.Branch reviews this.     Thank you for choosing [facility] !
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
## 2018-12-14 NOTE — Patient Instructions (Signed)
1. Upper endoscopy as scheduled.  Please see separate instructions. 

## 2018-12-14 NOTE — Telephone Encounter (Signed)
Called pt, EGD/DIL w/Propofol w/RMR scheduled for 12/17/18 at 8:45am. Verbal instructions for procedure given on phone-verbalized understanding. Orders entered. Endo scheduler informed.  Called pt back, informed him of pre-op appt 12/15/18 at 2:15pm. COVID test 3:15pm.

## 2018-12-15 ENCOUNTER — Telehealth: Payer: Self-pay | Admitting: Licensed Clinical Social Worker

## 2018-12-15 ENCOUNTER — Encounter: Payer: Self-pay | Admitting: Gastroenterology

## 2018-12-15 ENCOUNTER — Encounter (HOSPITAL_COMMUNITY): Payer: Self-pay

## 2018-12-15 ENCOUNTER — Other Ambulatory Visit (HOSPITAL_COMMUNITY)
Admission: RE | Admit: 2018-12-15 | Discharge: 2018-12-15 | Disposition: A | Discharge status: Home / Self Care | Payer: Medicare HMO | Source: Ambulatory Visit | Attending: Internal Medicine | Admitting: Internal Medicine

## 2018-12-15 ENCOUNTER — Other Ambulatory Visit: Payer: Self-pay

## 2018-12-15 ENCOUNTER — Encounter (HOSPITAL_COMMUNITY)
Admission: RE | Admit: 2018-12-15 | Discharge: 2018-12-15 | Disposition: A | Discharge status: Home / Self Care | Payer: Medicare HMO | Source: Ambulatory Visit | Attending: Internal Medicine | Admitting: Internal Medicine

## 2018-12-15 DIAGNOSIS — Z20828 Contact with and (suspected) exposure to other viral communicable diseases: Secondary | ICD-10-CM | POA: Diagnosis not present

## 2018-12-15 DIAGNOSIS — Z01812 Encounter for preprocedural laboratory examination: Secondary | ICD-10-CM | POA: Diagnosis not present

## 2018-12-15 LAB — BASIC METABOLIC PANEL
BUN/Creatinine Ratio: 11 (ref 10–24)
BUN: 12 mg/dL (ref 8–27)
CO2: 25 mmol/L (ref 20–29)
Calcium: 9.1 mg/dL (ref 8.6–10.2)
Chloride: 103 mmol/L (ref 96–106)
Creatinine, Ser: 1.06 mg/dL (ref 0.76–1.27)
GFR calc Af Amer: 87 mL/min/{1.73_m2} (ref >59)
GFR calc non Af Amer: 75 mL/min/{1.73_m2} (ref >59)
Glucose: 115 mg/dL — ABNORMAL HIGH (ref 65–99)
Potassium: 4.3 mmol/L (ref 3.5–5.2)
Sodium: 140 mmol/L (ref 134–144)

## 2018-12-15 LAB — SARS CORONAVIRUS 2 (TAT 6-24 HRS): SARS Coronavirus 2: NEGATIVE

## 2018-12-15 NOTE — Telephone Encounter (Signed)
CSW referred to assist patient with obtaining a BP cuff. CSW contacted patient to inform cuff will be delivered to home. Patient grateful for support and assistance. CSW available as needed. Jackie Ahmyah Gidley, LCSW, CCSW-MCS [phone]  

## 2018-12-15 NOTE — Assessment & Plan Note (Addendum)
63 year old gentleman with solid food dysphagia, some discomfort with swallowing.  Symptoms have been occurring for several years.  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  Esophagus was dilated.  He had short-lived response.  It may be that his stricture was not completely disrupted or has recurred.  Fortunately he has stopped alcohol consumption after her issues last year.  He denies any typical heartburn on a regular basis, uses over-the-counter antacids as needed.  Would recommend EGD with esophageal dilation in the near future.  Plan for deep sedation given previous heavy alcohol use.  I have discussed the risks, alternatives, benefits with regards to but not limited to the risk of reaction to medication, bleeding, infection, perforation and the patient is agreeable to proceed. Written consent to be obtained.  Can evaluate for any signs of advanced liver disease at time of EGD.  Had slightly low platelet count in the 140,000 range previously.  No previous abdominal imaging available.  If any evidence of persisting reflux findings, he will need daily PPI therapy.

## 2018-12-17 ENCOUNTER — Ambulatory Visit (HOSPITAL_COMMUNITY): Payer: Medicare HMO | Admitting: Anesthesiology

## 2018-12-17 ENCOUNTER — Ambulatory Visit (HOSPITAL_COMMUNITY)
Admission: RE | Admit: 2018-12-17 | Discharge: 2018-12-17 | Disposition: A | Discharge status: Home / Self Care | Payer: Medicare HMO | Attending: Internal Medicine | Admitting: Internal Medicine

## 2018-12-17 ENCOUNTER — Encounter (HOSPITAL_COMMUNITY): Payer: Self-pay | Admitting: *Deleted

## 2018-12-17 ENCOUNTER — Encounter (HOSPITAL_COMMUNITY)
Admission: RE | Disposition: A | Discharge status: Home / Self Care | Payer: Self-pay | Source: Home / Self Care | Attending: Internal Medicine

## 2018-12-17 DIAGNOSIS — I252 Old myocardial infarction: Secondary | ICD-10-CM | POA: Insufficient documentation

## 2018-12-17 DIAGNOSIS — Z79890 Hormone replacement therapy (postmenopausal): Secondary | ICD-10-CM | POA: Insufficient documentation

## 2018-12-17 DIAGNOSIS — K449 Diaphragmatic hernia without obstruction or gangrene: Secondary | ICD-10-CM

## 2018-12-17 DIAGNOSIS — Z87891 Personal history of nicotine dependence: Secondary | ICD-10-CM | POA: Insufficient documentation

## 2018-12-17 DIAGNOSIS — R1310 Dysphagia, unspecified: Secondary | ICD-10-CM | POA: Insufficient documentation

## 2018-12-17 DIAGNOSIS — R1319 Other dysphagia: Secondary | ICD-10-CM

## 2018-12-17 DIAGNOSIS — M069 Rheumatoid arthritis, unspecified: Secondary | ICD-10-CM | POA: Insufficient documentation

## 2018-12-17 DIAGNOSIS — Z7982 Long term (current) use of aspirin: Secondary | ICD-10-CM | POA: Insufficient documentation

## 2018-12-17 DIAGNOSIS — K269 Duodenal ulcer, unspecified as acute or chronic, without hemorrhage or perforation: Secondary | ICD-10-CM | POA: Insufficient documentation

## 2018-12-17 DIAGNOSIS — K2950 Unspecified chronic gastritis without bleeding: Secondary | ICD-10-CM | POA: Diagnosis not present

## 2018-12-17 DIAGNOSIS — K209 Esophagitis, unspecified without bleeding: Secondary | ICD-10-CM

## 2018-12-17 DIAGNOSIS — K2100 Gastro-esophageal reflux disease with esophagitis, without bleeding: Secondary | ICD-10-CM | POA: Insufficient documentation

## 2018-12-17 DIAGNOSIS — M1990 Unspecified osteoarthritis, unspecified site: Secondary | ICD-10-CM | POA: Insufficient documentation

## 2018-12-17 DIAGNOSIS — I509 Heart failure, unspecified: Secondary | ICD-10-CM | POA: Diagnosis not present

## 2018-12-17 DIAGNOSIS — E039 Hypothyroidism, unspecified: Secondary | ICD-10-CM | POA: Diagnosis not present

## 2018-12-17 DIAGNOSIS — Z79899 Other long term (current) drug therapy: Secondary | ICD-10-CM | POA: Diagnosis not present

## 2018-12-17 DIAGNOSIS — K219 Gastro-esophageal reflux disease without esophagitis: Secondary | ICD-10-CM | POA: Diagnosis not present

## 2018-12-17 DIAGNOSIS — K3189 Other diseases of stomach and duodenum: Secondary | ICD-10-CM | POA: Diagnosis not present

## 2018-12-17 HISTORY — PX: ESOPHAGOGASTRODUODENOSCOPY (EGD) WITH PROPOFOL: SHX5813

## 2018-12-17 HISTORY — PX: MALONEY DILATION: SHX5535

## 2018-12-17 HISTORY — PX: BIOPSY: SHX5522

## 2018-12-17 SURGERY — ESOPHAGOGASTRODUODENOSCOPY (EGD) WITH PROPOFOL
Anesthesia: General

## 2018-12-17 MED ORDER — HYDROMORPHONE HCL 1 MG/ML IJ SOLN: 0.2500 mg | INTRAMUSCULAR | Status: DC | PRN

## 2018-12-17 MED ORDER — MIDAZOLAM HCL 2 MG/2ML IJ SOLN: 0.5000 mg | Freq: Once | INTRAMUSCULAR | Status: DC | PRN

## 2018-12-17 MED ORDER — CHLORHEXIDINE GLUCONATE CLOTH 2 % EX PADS: 6.0000 | MEDICATED_PAD | Freq: Once | CUTANEOUS | Status: DC

## 2018-12-17 MED ORDER — LIDOCAINE VISCOUS HCL 2 % MT SOLN
OROMUCOSAL | Status: AC
  Filled 2018-12-17: qty 15

## 2018-12-17 MED ORDER — PROPOFOL 500 MG/50ML IV EMUL
INTRAVENOUS | Status: DC | PRN
  Administered 2018-12-17: 150 ug/kg/min via INTRAVENOUS

## 2018-12-17 MED ORDER — GLYCOPYRROLATE PF 0.2 MG/ML IJ SOSY
PREFILLED_SYRINGE | INTRAMUSCULAR | Status: AC
  Filled 2018-12-17: qty 1

## 2018-12-17 MED ORDER — LIDOCAINE HCL 1 % IJ SOLN
INTRAMUSCULAR | Status: DC | PRN
  Administered 2018-12-17: 50 mg via INTRADERMAL

## 2018-12-17 MED ORDER — PROMETHAZINE HCL 25 MG/ML IJ SOLN: 6.2500 mg | INTRAMUSCULAR | Status: DC | PRN

## 2018-12-17 MED ORDER — HYDROCODONE-ACETAMINOPHEN 7.5-325 MG PO TABS: 1.0000 | ORAL_TABLET | Freq: Once | ORAL | Status: DC | PRN

## 2018-12-17 MED ORDER — KETAMINE HCL 50 MG/5ML IJ SOSY
PREFILLED_SYRINGE | INTRAMUSCULAR | Status: AC
  Filled 2018-12-17: qty 5

## 2018-12-17 MED ORDER — PROPOFOL 10 MG/ML IV BOLUS
INTRAVENOUS | Status: DC | PRN
  Administered 2018-12-17 (×3): 20 mg via INTRAVENOUS

## 2018-12-17 MED ORDER — KETAMINE HCL 10 MG/ML IJ SOLN
INTRAMUSCULAR | Status: DC | PRN
  Administered 2018-12-17 (×2): 10 mg via INTRAVENOUS

## 2018-12-17 MED ORDER — LACTATED RINGERS IV SOLN
INTRAVENOUS | Status: DC
  Administered 2018-12-17: 08:00:00 via INTRAVENOUS

## 2018-12-17 NOTE — Op Note (Signed)
[facility] Patient Name: Brendan [name] Procedure Date: 12/17/2018 8:25 AM MRN: [id number] Date of Birth: 11-15-1955 Attending MD: Gennette Pac , MD CSN: [id number] Age: 63 Admit Type: Outpatient Procedure:                Upper GI endoscopy Indications:              Dysphagia Providers:                Gennette Pac, MD, Buel Ream. Thomasena Edis RN, RN,                            Dyann Ruddle Referring MD:              Medicines:                Propofol per Anesthesia Complications:            No immediate complications. Estimated Blood Loss:     Estimated blood loss was minimal. Procedure:                Pre-Anesthesia Assessment:                           - Prior to the procedure, a History and Physical                            was performed, and patient medications and                            allergies were reviewed. The patient's tolerance of                            previous anesthesia was also reviewed. The risks                            and benefits of the procedure and the sedation                            options and risks were discussed with the patient.                            All questions were answered, and informed consent                            was obtained. Prior Anticoagulants: The patient has                            taken no previous anticoagulant or antiplatelet                            agents. ASA Grade Assessment: II - A patient with                            mild systemic disease. After reviewing the risks  and benefits, the patient was deemed in                            satisfactory condition to undergo the procedure.                           After obtaining informed consent, the endoscope was                            passed under direct vision. Throughout the                            procedure, the patient's blood pressure, pulse, and                            oxygen saturations were  monitored continuously. The                            GIF-H190 ([id number]) scope was introduced through the                            mouth, and advanced to the second part of duodenum.                            The upper GI endoscopy was accomplished without                            difficulty. The patient tolerated the procedure                            well. Scope In: 8:44:53 AM Scope Out: 8:52:21 AM Total Procedure Duration: 0 hours 7 minutes 28 seconds  Findings:      Esophagitis with no bleeding was found. Friability circumferential       erosions within a centimeter of the GE junction with soft       benign-appearing peptic stricture(easily able to traverse this segment       with the adult gastroscope). No mass or Barrett's epithelium seen.      A medium hiatal hernia was present.      Mucosal changes were found in the stomach. Gastric erosions present. No       ulcer or infiltrating process. Pylorus patent.      The duodenal bulb and second portion of the duodenum seen. Bulbar       erosions present. No ulcer. Otherwise, normal-appearing D1-D2.. The       scope was withdrawn. Dilation was performed with a Maloney dilator with       mild resistance at 54 Fr. The scope was withdrawn. Dilation was       performed with a Maloney dilator with mild resistance at 56 Fr. The       dilation site was examined following endoscope reinsertion and showed       moderate mucosal disruption. Estimated blood loss was minimal. Finally,       biopsies of the gastric mucosa taken. Impression:               -Moderately severe erosive reflux esophagitis  with                            peptic stricture. Dilated.                           Medium sized hiatal hernia.                           Gastric erosions?"status post biopsy.                           Duodenal erosions                           . Moderate Sedation:      Moderate (conscious) sedation was administered by the endoscopy nurse        and supervised by the endoscopist. The following parameters were       monitored: oxygen saturation, heart rate, blood pressure, respiratory       rate, EKG, adequacy of pulmonary ventilation, and response to care. Recommendation:           - Patient has a contact number available for                            emergencies. The signs and symptoms of potential                            delayed complications were discussed with the                            patient. Return to normal activities tomorrow.                            Written discharge instructions were provided to the                            patient.                           - Resume previous diet.                           - Continue present medications. Begin Protonix 40                            mg twice daily follow-up on pathology.                           - Return to GI clinic in 6 months. Procedure Code(s):        --- Professional ---                           [phone], Esophagogastroduodenoscopy, flexible,                            transoral; diagnostic, including collection of  specimen(s) by brushing or washing, when performed                            (separate procedure)                           43450, Dilation of esophagus, by unguided sound or                            bougie, single or multiple passes Diagnosis Code(s):        --- Professional ---                           K20.90, Esophagitis, unspecified without bleeding                           K44.9, Diaphragmatic hernia without obstruction or                            gangrene                           K31.89, Other diseases of stomach and duodenum                           R13.10, Dysphagia, unspecified CPT copyright 2019 American Medical Association. All rights reserved. The codes documented in this report are preliminary and upon coder review may  be revised to meet current compliance requirements. Gerrit Friendsobert M.  Correna Meacham, MD Gennette Pacobert Michael Amila Callies, MD 12/17/2018 9:11:11 AM This report has been signed electronically. Number of Addenda: 0

## 2018-12-17 NOTE — Anesthesia Preprocedure Evaluation (Signed)
Anesthesia Evaluation  Patient identified by MRN, date of birth, ID band Patient awake    Reviewed: Allergy & Precautions, NPO status , Patient's Chart, lab work & pertinent test results  Airway Mallampati: II  TM Distance: >3 FB Neck ROM: Full    Dental no notable dental hx. (+) Missing   Pulmonary neg pulmonary ROS, former smoker,    Pulmonary exam normal breath sounds clear to auscultation       Cardiovascular Exercise Tolerance: Good + Past MI and +CHF  Normal cardiovascular examI Rhythm:Regular Rate:Normal  Last EF ~04/2018 improved to ~35-40% States walks over a mile  MI h/o 01/2018 States NO NTG use in over 8 months    Neuro/Psych negative neurological ROS  negative psych ROS   GI/Hepatic Neg liver ROS, GERD  Controlled,Only uses OTC -denies GERD Sx now States here for EGD and possible dilation    Endo/Other  Hypothyroidism   Renal/GU negative Renal ROS  negative genitourinary   Musculoskeletal  (+) Arthritis , Rheumatoid disorders,    Abdominal   Peds negative pediatric ROS (+)  Hematology negative hematology ROS (+)   Anesthesia Other Findings   Reproductive/Obstetrics negative OB ROS                             Anesthesia Physical Anesthesia Plan  ASA: III  Anesthesia Plan: General   Post-op Pain Management:    Induction: Intravenous  PONV Risk Score and Plan: 2 and Propofol infusion and TIVA  Airway Management Planned: Nasal Cannula and Simple Face Mask  Additional Equipment:   Intra-op Plan:   Post-operative Plan:   Informed Consent: I have reviewed the patients History and Physical, chart, labs and discussed the procedure including the risks, benefits and alternatives for the proposed anesthesia with the patient or authorized representative who has indicated his/her understanding and acceptance.     Dental advisory given  Plan Discussed with:  CRNA  Anesthesia Plan Comments: (Plan Full PPE use  Plan GA with GETA as needed d/w pt -WTP with same after Q&A)        Anesthesia Quick Evaluation

## 2018-12-17 NOTE — Interval H&P Note (Signed)
History and Physical Interval Note:  12/17/2018 8:02 AM  Harvel Ricks  has presented today for surgery, with the diagnosis of esophageal dysphagia.  The various methods of treatment have been discussed with the patient and family. After consideration of risks, benefits and other options for treatment, the patient has consented to  Procedure(s) with comments: ESOPHAGOGASTRODUODENOSCOPY (EGD) WITH PROPOFOL (N/A) - 8:45am MALONEY DILATION (N/A) as a surgical intervention.  The patient's history has been reviewed, patient examined, no change in status, stable for surgery.  I have reviewed the patient's chart and labs.  Questions were answered to the patient's satisfaction.     Lamoyne Hessel  No change.  EGD with dilation as feasible/appropriate.  Patient only takes an occasional Tums for reflux.  This is probably inadequate.  The risks, benefits, limitations, alternatives and imponderables have been reviewed with the patient. Potential for esophageal dilation, biopsy, etc. have also been reviewed.  Questions have been answered. All parties agreeable.

## 2018-12-17 NOTE — Discharge Instructions (Signed)
EGD Discharge instructions Please read the instructions outlined below and refer to this sheet in the next few weeks. These discharge instructions provide you with general information on caring for yourself after you leave the hospital. Your doctor may also give you specific instructions. While your treatment has been planned according to the most current medical practices available, unavoidable complications occasionally occur. If you have any problems or questions after discharge, please call your doctor. ACTIVITY  You may resume your regular activity but move at a slower pace for the next 24 hours.   Take frequent rest periods for the next 24 hours.   Walking will help expel (get rid of) the air and reduce the bloated feeling in your abdomen.   No driving for 24 hours (because of the anesthesia (medicine) used during the test).   You may shower.   Do not sign any important legal documents or operate any machinery for 24 hours (because of the anesthesia used during the test).  NUTRITION  Drink plenty of fluids.   You may resume your normal diet.   Begin with a light meal and progress to your normal diet.   Avoid alcoholic beverages for 24 hours or as instructed by your caregiver.  MEDICATIONS  You may resume your normal medications unless your caregiver tells you otherwise.  WHAT YOU CAN EXPECT TODAY  You may experience abdominal discomfort such as a feeling of fullness or gas pains.  FOLLOW-UP  Your doctor will discuss the results of your test with you.  SEEK IMMEDIATE MEDICAL ATTENTION IF ANY OF THE FOLLOWING OCCUR:  Excessive nausea (feeling sick to your stomach) and/or vomiting.   Severe abdominal pain and distention (swelling).   Trouble swallowing.   Temperature over 101 F (37.8 C).   Rectal bleeding or vomiting of blood.    GERD information provided  Begin Protonix 40 mg daily-take 1 every day indefinitely.  This is for reflux and your stricture( related  to reflux).  Take this daily whether or not you think you needed or not.  Doctors orders.  Office visit with [place] in 6 months  Further recommendations to follow pending review of pathology report  At patient request, I called Janace Litten at [phone]; no answer.

## 2018-12-17 NOTE — Anesthesia Postprocedure Evaluation (Signed)
Anesthesia Post Note  Patient: Brendan [name]  Procedure(s) Performed: ESOPHAGOGASTRODUODENOSCOPY (EGD) WITH PROPOFOL (N/A ) MALONEY DILATION (N/A ) BIOPSY  Patient location during evaluation: PACU Anesthesia Type: General Level of consciousness: awake and alert and oriented Pain management: pain level controlled Vital Signs Assessment: post-procedure vital signs reviewed and stable Respiratory status: spontaneous breathing Cardiovascular status: blood pressure returned to baseline and stable Postop Assessment: no apparent nausea or vomiting Anesthetic complications: no     Last Vitals:  Vitals:   12/17/18 0803 12/17/18 0900  BP: 121/60 101/69  Pulse: (!) 58 63  Resp: 18 12  Temp: 36.7 C (P) 36.6 C  SpO2: 97% 97%    Last Pain:  Vitals:   12/17/18 0842  TempSrc:   PainSc: 0-No pain                 Tomoya Ringwald

## 2018-12-17 NOTE — Transfer of Care (Signed)
Immediate Anesthesia Transfer of Care Note  Patient: Brendan [name]  Procedure(s) Performed: ESOPHAGOGASTRODUODENOSCOPY (EGD) WITH PROPOFOL (N/A ) MALONEY DILATION (N/A ) BIOPSY  Patient Location: PACU  Anesthesia Type:General  Level of Consciousness: awake  Airway & Oxygen Therapy: Patient Spontanous Breathing  Post-op Assessment: Report given to RN  Post vital signs: Reviewed  Last Vitals:  Vitals Value Taken Time  BP 101/69 12/17/18 0901  Temp    Pulse 64 12/17/18 0903  Resp 17 12/17/18 0903  SpO2 96 % 12/17/18 0903  Vitals shown include unvalidated device data.  Last Pain:  Vitals:   12/17/18 0842  TempSrc:   PainSc: 0-No pain         Complications: No apparent anesthesia complications

## 2018-12-18 LAB — SURGICAL PATHOLOGY

## 2018-12-19 ENCOUNTER — Encounter: Payer: Self-pay | Admitting: Internal Medicine

## 2018-12-22 ENCOUNTER — Encounter (HOSPITAL_COMMUNITY): Payer: Self-pay | Admitting: Internal Medicine

## 2018-12-22 ENCOUNTER — Telehealth: Payer: Self-pay

## 2018-12-22 NOTE — Telephone Encounter (Signed)
Pt made aware

## 2018-12-22 NOTE — Telephone Encounter (Signed)
-----   Message from Arnoldo Lenis, MD sent at 12/22/2018  8:36 AM EST ----- Normal labs   Zandra Abts MD

## 2019-01-12 ENCOUNTER — Other Ambulatory Visit: Payer: Self-pay

## 2019-01-12 ENCOUNTER — Ambulatory Visit: Payer: Medicare HMO | Admitting: Cardiology

## 2019-01-12 ENCOUNTER — Encounter: Payer: Self-pay | Admitting: Cardiology

## 2019-01-12 VITALS — BP 110/66 | HR 62 | Temp 97.1°F | Ht 72.0 in | Wt 230.0 lb

## 2019-01-12 DIAGNOSIS — I428 Other cardiomyopathies: Secondary | ICD-10-CM | POA: Diagnosis not present

## 2019-01-12 DIAGNOSIS — I5022 Chronic systolic (congestive) heart failure: Secondary | ICD-10-CM

## 2019-01-12 NOTE — Patient Instructions (Signed)
Medication Instructions:  Your physician recommends that you continue on your current medications as directed. Please refer to the Current Medication list given to you today.   Labwork: none  Testing/Procedures: Your physician has requested that you have an echocardiogram. Echocardiography is a painless test that uses sound waves to create images of your heart. It provides your doctor with information about the size and shape of your heart and how well your heart's chambers and valves are working. This procedure takes approximately one hour. There are no restrictions for this procedure.     Follow-Up: Your physician recommends that you schedule a follow-up appointment in: pending test result    Any Other Special Instructions Will Be Listed Below (If Applicable).     If you need a refill on your cardiac medications before your next appointment, please call your pharmacy.   
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
## 2019-01-13 ENCOUNTER — Ambulatory Visit: Payer: Medicare HMO | Admitting: Cardiology

## 2019-01-15 ENCOUNTER — Ambulatory Visit (HOSPITAL_COMMUNITY)
Admission: RE | Admit: 2019-01-15 | Discharge: 2019-01-15 | Disposition: A | Discharge status: Home / Self Care | Payer: Medicare HMO | Source: Ambulatory Visit | Attending: Cardiology | Admitting: Cardiology

## 2019-01-15 ENCOUNTER — Other Ambulatory Visit: Payer: Self-pay

## 2019-01-15 DIAGNOSIS — I5022 Chronic systolic (congestive) heart failure: Secondary | ICD-10-CM | POA: Insufficient documentation

## 2019-01-15 NOTE — Progress Notes (Signed)
*  PRELIMINARY RESULTS* Echocardiogram 2D Echocardiogram has been performed.  Brendan [name] 01/15/2019, 11:06 AM

## 2019-01-21 ENCOUNTER — Telehealth: Payer: Self-pay

## 2019-01-21 NOTE — Telephone Encounter (Signed)
PT made aware of echo results. Appointment made for virtual visit.      Virtual Visit Pre-Appointment Phone Call  "(Name), I am calling you today to discuss your upcoming appointment. We are currently trying to limit exposure to the virus that causes COVID-19 by seeing patients at home rather than in the office."  1. "What is the BEST phone number to call the day of the visit?" - include this in appointment notes  2. "Do you have or have access to (through a family member/friend) a smartphone with video capability that we can use for your visit?" a. If yes - list this number in appt notes as "cell" (if different from BEST phone #) and list the appointment type as a VIDEO visit in appointment notes b. If no - list the appointment type as a PHONE visit in appointment notes  3. Confirm consent - "In the setting of the current Covid19 crisis, you are scheduled for a (phone or video) visit with your provider on (date) at (time).  Just as we do with many in-office visits, in order for you to participate in this visit, we must obtain consent.  If you'd like, I can send this to your mychart (if signed up) or email for you to review.  Otherwise, I can obtain your verbal consent now.  All virtual visits are billed to your insurance company just like a normal visit would be.  By agreeing to a virtual visit, we'd like you to understand that the technology does not allow for your provider to perform an examination, and thus may limit your provider's ability to fully assess your condition. If your provider identifies any concerns that need to be evaluated in person, we will make arrangements to do so.  Finally, though the technology is pretty good, we cannot assure that it will always work on either your or our end, and in the setting of a video visit, we may have to convert it to a phone-only visit.  In either situation, we cannot ensure that we have a secure connection.  Are you willing to proceed?" STAFF: Did  the patient verbally acknowledge consent to telehealth visit? Document YES/NO here:yes  4. Advise patient to be prepared - "Two hours prior to your appointment, go ahead and check your blood pressure, pulse, oxygen saturation, and your weight (if you have the equipment to check those) and write them all down. When your visit starts, your provider will ask you for this information. If you have an Apple Watch or Kardia device, please plan to have heart rate information ready on the day of your appointment. Please have a pen and paper handy nearby the day of the visit as well."  5. Give patient instructions for MyChart download to smartphone OR [email or web address] as below if video visit (depending on what platform provider is using)  6. Inform patient they will receive a phone call 15 minutes prior to their appointment time (may be from unknown caller ID) so they should be prepared to answer    TELEPHONE CALL NOTE  [place] has been deemed a candidate for a follow-up tele-health visit to limit community exposure during the Covid-19 pandemic. I spoke with the patient via phone to ensure availability of phone/video source, confirm preferred email & phone number, and discuss instructions and expectations.  I reminded STORMY SABOL to be prepared with any vital sign and/or heart rhythm information that could potentially be obtained via home monitoring, at the time of his  visit. I reminded ALLARD LIGHTSEY to expect a phone call prior to his visit.  Drema Dallas, [place] 01/21/2019 3:04 PM   INSTRUCTIONS FOR DOWNLOADING THE MYCHART APP TO SMARTPHONE  - The patient must first make sure to have activated MyChart and know their login information - If Apple, go to [employer] and type in MyChart in the search bar and download the app. If Android, ask patient to go to [employer] and type in [place] in the search bar and download the app. The app is free but as with any other app downloads, their  phone may require them to verify saved payment information or Apple/Android password.  - The patient will need to then log into the app with their MyChart username and password, and select [facility] as their healthcare provider to link the account. When it is time for your visit, go to the MyChart app, find appointments, and click Begin Video Visit. Be sure to Select Allow for your device to access the Microphone and Camera for your visit. You will then be connected, and your provider will be with you shortly.  **If they have any issues connecting, or need assistance please contact MyChart service desk [phone])**  **If using a computer, in order to ensure the best quality for their visit they will need to use either of the following Internet Browsers: [employer], or Google Chrome**  IF USING DOXIMITY or DOXY.ME - The patient will receive a link just prior to their visit by text.     FULL LENGTH CONSENT FOR TELE-HEALTH VISIT   I hereby voluntarily request, consent and authorize [facility] and its employed or contracted physicians, physician assistants, nurse practitioners or other licensed health care professionals (the Practitioner), to provide me with telemedicine health care services (the "Services") as deemed necessary by the treating Practitioner. I acknowledge and consent to receive the Services by the Practitioner via telemedicine. I understand that the telemedicine visit will involve communicating with the Practitioner through live audiovisual communication technology and the disclosure of certain medical information by electronic transmission. I acknowledge that I have been given the opportunity to request an in-person assessment or other available alternative prior to the telemedicine visit and am voluntarily participating in the telemedicine visit.  I understand that I have the right to withhold or withdraw my consent to the use of telemedicine in the course of  my care at any time, without affecting my right to future care or treatment, and that the Practitioner or I may terminate the telemedicine visit at any time. I understand that I have the right to inspect all information obtained and/or recorded in the course of the telemedicine visit and may receive copies of available information for a reasonable fee.  I understand that some of the potential risks of receiving the Services via telemedicine include:  Marland Kitchen Delay or interruption in medical evaluation due to technological equipment failure or disruption; . Information transmitted may not be sufficient (e.g. poor resolution of images) to allow for appropriate medical decision making by the Practitioner; and/or  . In rare instances, security protocols could fail, causing a breach of personal health information.  Furthermore, I acknowledge that it is my responsibility to provide information about my medical history, conditions and care that is complete and accurate to the best of my ability. I acknowledge that Practitioner's advice, recommendations, and/or decision may be based on factors not within their control, such as incomplete or inaccurate data provided by me  or distortions of diagnostic images or specimens that may result from electronic transmissions. I understand that the practice of medicine is not an exact science and that Practitioner makes no warranties or guarantees regarding treatment outcomes. I acknowledge that I will receive a copy of this consent concurrently upon execution via email to the email address I last provided but may also request a printed copy by calling the office of [facility].    I understand that my insurance will be billed for this visit.   I have read or had this consent read to me. . I understand the contents of this consent, which adequately explains the benefits and risks of the Services being provided via telemedicine.  . I have been provided ample opportunity to ask  questions regarding this consent and the Services and have had my questions answered to my satisfaction. . I give my informed consent for the services to be provided through the use of telemedicine in my medical care  By participating in this telemedicine visit I agree to the above.

## 2019-02-04 ENCOUNTER — Telehealth: Payer: Self-pay | Admitting: Gastroenterology

## 2019-02-04 NOTE — Telephone Encounter (Signed)
Please make ov for 06/2019 as per recommendations at time of EGD.

## 2019-02-08 ENCOUNTER — Encounter: Payer: Self-pay | Admitting: Internal Medicine

## 2019-02-08 NOTE — Telephone Encounter (Signed)
PATIENT SCHEDULED AND LETTER SENT  °

## 2019-02-19 ENCOUNTER — Encounter: Payer: Self-pay | Admitting: Cardiology

## 2019-02-19 ENCOUNTER — Telehealth (INDEPENDENT_AMBULATORY_CARE_PROVIDER_SITE_OTHER): Payer: Medicare HMO | Admitting: Cardiology

## 2019-02-19 VITALS — BP 123/60 | Ht 72.0 in | Wt 230.0 lb

## 2019-02-19 DIAGNOSIS — I428 Other cardiomyopathies: Secondary | ICD-10-CM | POA: Diagnosis not present

## 2019-02-19 DIAGNOSIS — Z79899 Other long term (current) drug therapy: Secondary | ICD-10-CM

## 2019-02-19 MED ORDER — ENTRESTO 97-103 MG PO TABS: 1.0000 | ORAL_TABLET | Freq: Two times a day (BID) | ORAL | 3 refills | Status: DC
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
## 2019-02-19 NOTE — Addendum Note (Signed)
Addended byAbelino Derrick R on: 02/19/2019 02:07 PM   Modules accepted: Orders

## 2019-02-19 NOTE — Patient Instructions (Signed)
Medication Instructions: Increase ENTRESTO to 97/103 mg - two times daily   Labwork: 2 weeks   cmet  Testing/Procedures: none  Follow-Up: Your physician recommends that you schedule a follow-up appointment in:  2 months    Any Other Special Instructions Will Be Listed Below (If Applicable).     If you need a refill on your cardiac medications before your next appointment, please call your pharmacy.

## 2019-03-05 ENCOUNTER — Other Ambulatory Visit: Payer: Self-pay | Admitting: Cardiology

## 2019-03-05 DIAGNOSIS — I428 Other cardiomyopathies: Secondary | ICD-10-CM | POA: Diagnosis not present

## 2019-03-06 LAB — COMPREHENSIVE METABOLIC PANEL
ALT: 46 IU/L — ABNORMAL HIGH (ref 0–44)
AST: 26 IU/L (ref 0–40)
Albumin/Globulin Ratio: 2.3 — ABNORMAL HIGH (ref 1.2–2.2)
Albumin: 4.1 g/dL (ref 3.8–4.8)
Alkaline Phosphatase: 42 IU/L (ref 39–117)
BUN/Creatinine Ratio: 16 (ref 10–24)
BUN: 16 mg/dL (ref 8–27)
Bilirubin Total: 0.7 mg/dL (ref 0.0–1.2)
CO2: 23 mmol/L (ref 20–29)
Calcium: 9.3 mg/dL (ref 8.6–10.2)
Chloride: 104 mmol/L (ref 96–106)
Creatinine, Ser: 1.01 mg/dL (ref 0.76–1.27)
GFR calc Af Amer: 92 mL/min/{1.73_m2} (ref >59)
GFR calc non Af Amer: 80 mL/min/{1.73_m2} (ref >59)
Globulin, Total: 1.8 g/dL (ref 1.5–4.5)
Glucose: 107 mg/dL — ABNORMAL HIGH (ref 65–99)
Potassium: 4.4 mmol/L (ref 3.5–5.2)
Sodium: 139 mmol/L (ref 134–144)
Total Protein: 5.9 g/dL — ABNORMAL LOW (ref 6.0–8.5)

## 2019-03-24 DIAGNOSIS — R69 Illness, unspecified: Secondary | ICD-10-CM | POA: Diagnosis not present

## 2019-04-06 DIAGNOSIS — E7849 Other hyperlipidemia: Secondary | ICD-10-CM | POA: Diagnosis not present

## 2019-04-06 DIAGNOSIS — E039 Hypothyroidism, unspecified: Secondary | ICD-10-CM | POA: Diagnosis not present

## 2019-04-06 DIAGNOSIS — Z125 Encounter for screening for malignant neoplasm of prostate: Secondary | ICD-10-CM | POA: Diagnosis not present

## 2019-04-06 DIAGNOSIS — Z0001 Encounter for general adult medical examination with abnormal findings: Secondary | ICD-10-CM | POA: Diagnosis not present

## 2019-04-06 DIAGNOSIS — Z6831 Body mass index (BMI) 31.0-31.9, adult: Secondary | ICD-10-CM | POA: Diagnosis not present

## 2019-04-06 DIAGNOSIS — E6609 Other obesity due to excess calories: Secondary | ICD-10-CM | POA: Diagnosis not present

## 2019-04-06 DIAGNOSIS — E782 Mixed hyperlipidemia: Secondary | ICD-10-CM | POA: Diagnosis not present

## 2019-04-06 DIAGNOSIS — Z1389 Encounter for screening for other disorder: Secondary | ICD-10-CM | POA: Diagnosis not present

## 2019-04-06 DIAGNOSIS — E063 Autoimmune thyroiditis: Secondary | ICD-10-CM | POA: Diagnosis not present

## 2019-04-22 ENCOUNTER — Other Ambulatory Visit: Payer: Self-pay

## 2019-04-22 ENCOUNTER — Ambulatory Visit (INDEPENDENT_AMBULATORY_CARE_PROVIDER_SITE_OTHER): Payer: Medicare HMO | Admitting: Cardiology

## 2019-04-22 ENCOUNTER — Encounter: Payer: Self-pay | Admitting: Cardiology

## 2019-04-22 VITALS — BP 122/58 | HR 62 | Temp 97.1°F | Ht 72.0 in | Wt 234.0 lb

## 2019-04-22 DIAGNOSIS — I428 Other cardiomyopathies: Secondary | ICD-10-CM | POA: Diagnosis not present

## 2019-04-22 DIAGNOSIS — I5022 Chronic systolic (congestive) heart failure: Secondary | ICD-10-CM | POA: Diagnosis not present

## 2019-04-22 DIAGNOSIS — D696 Thrombocytopenia, unspecified: Secondary | ICD-10-CM | POA: Diagnosis not present
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
## 2019-04-22 NOTE — Patient Instructions (Signed)

## 2019-04-26 DIAGNOSIS — M1990 Unspecified osteoarthritis, unspecified site: Secondary | ICD-10-CM | POA: Diagnosis not present

## 2019-04-26 DIAGNOSIS — M069 Rheumatoid arthritis, unspecified: Secondary | ICD-10-CM | POA: Diagnosis not present

## 2019-04-28 DIAGNOSIS — M1990 Unspecified osteoarthritis, unspecified site: Secondary | ICD-10-CM | POA: Diagnosis not present

## 2019-04-28 DIAGNOSIS — Z79891 Long term (current) use of opiate analgesic: Secondary | ICD-10-CM | POA: Diagnosis not present

## 2019-04-28 DIAGNOSIS — M2550 Pain in unspecified joint: Secondary | ICD-10-CM | POA: Diagnosis not present

## 2019-04-28 DIAGNOSIS — M069 Rheumatoid arthritis, unspecified: Secondary | ICD-10-CM | POA: Diagnosis not present

## 2019-05-10 ENCOUNTER — Other Ambulatory Visit: Payer: Self-pay

## 2019-05-10 ENCOUNTER — Encounter (HOSPITAL_COMMUNITY): Payer: Self-pay | Admitting: Surgery

## 2019-05-11 ENCOUNTER — Inpatient Hospital Stay (HOSPITAL_COMMUNITY): Payer: Medicare HMO | Attending: Hematology | Admitting: Hematology

## 2019-05-11 ENCOUNTER — Inpatient Hospital Stay (HOSPITAL_COMMUNITY): Payer: Medicare HMO

## 2019-05-11 ENCOUNTER — Encounter (HOSPITAL_COMMUNITY): Payer: Self-pay | Admitting: Hematology

## 2019-05-11 VITALS — BP 116/56 | HR 58 | Temp 97.1°F | Resp 18 | Ht 72.0 in | Wt 232.0 lb

## 2019-05-11 DIAGNOSIS — I252 Old myocardial infarction: Secondary | ICD-10-CM | POA: Insufficient documentation

## 2019-05-11 DIAGNOSIS — I509 Heart failure, unspecified: Secondary | ICD-10-CM | POA: Insufficient documentation

## 2019-05-11 DIAGNOSIS — E039 Hypothyroidism, unspecified: Secondary | ICD-10-CM | POA: Insufficient documentation

## 2019-05-11 DIAGNOSIS — D696 Thrombocytopenia, unspecified: Secondary | ICD-10-CM | POA: Diagnosis not present

## 2019-05-11 DIAGNOSIS — E538 Deficiency of other specified B group vitamins: Secondary | ICD-10-CM | POA: Insufficient documentation

## 2019-05-11 DIAGNOSIS — Z7982 Long term (current) use of aspirin: Secondary | ICD-10-CM | POA: Insufficient documentation

## 2019-05-11 DIAGNOSIS — Z87891 Personal history of nicotine dependence: Secondary | ICD-10-CM | POA: Insufficient documentation

## 2019-05-11 DIAGNOSIS — M069 Rheumatoid arthritis, unspecified: Secondary | ICD-10-CM | POA: Diagnosis not present

## 2019-05-11 DIAGNOSIS — Z79899 Other long term (current) drug therapy: Secondary | ICD-10-CM | POA: Diagnosis not present

## 2019-05-11 LAB — CBC WITH DIFFERENTIAL/PLATELET
Abs Immature Granulocytes: 0.01 10*3/uL (ref 0.00–0.07)
Basophils Absolute: 0 10*3/uL (ref 0.0–0.1)
Basophils Relative: 0 %
Eosinophils Absolute: 0.3 10*3/uL (ref 0.0–0.5)
Eosinophils Relative: 6 %
HCT: 42.9 % (ref 39.0–52.0)
Hemoglobin: 14.6 g/dL (ref 13.0–17.0)
Immature Granulocytes: 0 %
Lymphocytes Relative: 31 %
Lymphs Abs: 1.4 10*3/uL (ref 0.7–4.0)
MCH: 31.8 pg (ref 26.0–34.0)
MCHC: 34 g/dL (ref 30.0–36.0)
MCV: 93.5 fL (ref 80.0–100.0)
Monocytes Absolute: 0.5 10*3/uL (ref 0.1–1.0)
Monocytes Relative: 11 %
Neutro Abs: 2.3 10*3/uL (ref 1.7–7.7)
Neutrophils Relative %: 52 %
Platelets: 132 10*3/uL — ABNORMAL LOW (ref 150–400)
RBC: 4.59 MIL/uL (ref 4.22–5.81)
RDW: 12.9 % (ref 11.5–15.5)
WBC: 4.5 10*3/uL (ref 4.0–10.5)
nRBC: 0 % (ref 0.0–0.2)

## 2019-05-11 LAB — COMPREHENSIVE METABOLIC PANEL
ALT: 54 U/L — ABNORMAL HIGH (ref 0–44)
AST: 26 U/L (ref 15–41)
Albumin: 4 g/dL (ref 3.5–5.0)
Alkaline Phosphatase: 36 U/L — ABNORMAL LOW (ref 38–126)
Anion gap: 7 (ref 5–15)
BUN: 17 mg/dL (ref 8–23)
CO2: 26 mmol/L (ref 22–32)
Calcium: 9.1 mg/dL (ref 8.9–10.3)
Chloride: 104 mmol/L (ref 98–111)
Creatinine, Ser: 0.89 mg/dL (ref 0.61–1.24)
GFR calc Af Amer: >60 mL/min (ref >60)
GFR calc non Af Amer: >60 mL/min (ref >60)
Glucose, Bld: 98 mg/dL (ref 70–99)
Potassium: 4.2 mmol/L (ref 3.5–5.1)
Sodium: 137 mmol/L (ref 135–145)
Total Bilirubin: 1 mg/dL (ref 0.3–1.2)
Total Protein: 6.1 g/dL — ABNORMAL LOW (ref 6.5–8.1)

## 2019-05-11 LAB — FOLATE: Folate: 16 ng/mL (ref >5.9)

## 2019-05-11 LAB — HEPATITIS B CORE ANTIBODY, IGM: Hep B C IgM: NONREACTIVE

## 2019-05-11 LAB — HEPATITIS B SURFACE ANTIBODY,QUALITATIVE: Hep B S Ab: NONREACTIVE

## 2019-05-11 LAB — VITAMIN B12: Vitamin B-12: 219 pg/mL (ref 180–914)

## 2019-05-11 LAB — LACTATE DEHYDROGENASE: LDH: 121 U/L (ref 98–192)

## 2019-05-11 LAB — VITAMIN D 25 HYDROXY (VIT D DEFICIENCY, FRACTURES): Vit D, 25-Hydroxy: 28.03 ng/mL — ABNORMAL LOW (ref 30–100)

## 2019-05-11 LAB — HEPATITIS B SURFACE ANTIGEN: Hepatitis B Surface Ag: NONREACTIVE
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
## 2019-05-11 NOTE — Patient Instructions (Signed)
[facility] at [facility] Discharge Instructions  Follow up in 2-3 weeks   Thank you for choosing [facility] at [facility] to provide your oncology and hematology care.  To afford each patient quality time with our provider, please arrive at least 15 minutes before your scheduled appointment time.   If you have a lab appointment with the Cancer Center please come in thru the Main Entrance and check in at the main information desk.  You need to re-schedule your appointment should you arrive 10 or more minutes late.  We strive to give you quality time with our providers, and arriving late affects you and other patients whose appointments are after yours.  Also, if you no show three or more times for appointments you may be dismissed from the clinic at the providers discretion.     Again, thank you for choosing [facility].  Our hope is that these requests will decrease the amount of time that you wait before being seen by our physicians.       _____________________________________________________________  Should you have questions after your visit to  Cancer Center, please contact our office at [phone] between the hours of 8:00 a.m. and 4:30 p.m.  Voicemails left after 4:00 p.m. will not be returned until the following business day.  For prescription refill requests, have your pharmacy contact our office and allow 72 hours.    Due to Covid, you will need to wear a mask upon entering the hospital. If you do not have a mask, a mask will be given to you at the Main Entrance upon arrival. For doctor visits, patients may have 1 support person with them. For treatment visits, patients can not have anyone with them due to social distancing guidelines and our immunocompromised population.      

## 2019-05-11 NOTE — Assessment & Plan Note (Addendum)
1.  Mild thrombocytopenia: -Sent from Dr. Sharyon Medicus office for work-up of thrombocytopenia. -CBC on 04/22/2019 shows platelet count of 121 with normal hemoglobin and white count.  Prior to that platelet count was 128 on 04/06/2019. -She has mild to moderate thrombocytopenia since December 2019. -Has some easy bruising on upper extremities but denies any major bleeding.  He is also on aspirin 81 mg daily. -Denies fevers, night sweats or weight loss in the last 6 months. -We will repeat platelet count in blue top tube.  We will also check for nutritional deficiencies and infectious causes. -Patient reports that he has been on Kevzara for the last 2 to 3 years for his rheumatoid arthritis. -Review of adverse effects of this medication shows that it can potentially contribute to thrombocytopenia. -As it is only mild thrombocytopenia, no dose reductions needed at this time.  2.  Rheumatoid arthritis: -This affects all of his joints but predominantly upper part of the body. -He has been treated with various agents for rheumatoid arthritis.  This is best controlled with Carlis Abbott for the last 2 to 3 years.  Hence he is reluctant to make any changes.

## 2019-05-12 LAB — PROTEIN ELECTROPHORESIS, SERUM
A/G Ratio: 1.6 (ref 0.7–1.7)
Albumin ELP: 3.9 g/dL (ref 2.9–4.4)
Alpha-1-Globulin: 0.2 g/dL (ref 0.0–0.4)
Alpha-2-Globulin: 0.5 g/dL (ref 0.4–1.0)
Beta Globulin: 0.9 g/dL (ref 0.7–1.3)
Gamma Globulin: 0.8 g/dL (ref 0.4–1.8)
Globulin, Total: 2.4 g/dL (ref 2.2–3.9)
Total Protein ELP: 6.3 g/dL (ref 6.0–8.5)

## 2019-05-14 LAB — COPPER, SERUM: Copper: 68 ug/dL — ABNORMAL LOW (ref 69–132)

## 2019-05-19 LAB — METHYLMALONIC ACID, SERUM: Methylmalonic Acid, Quantitative: 526 nmol/L — ABNORMAL HIGH (ref 0–378)

## 2019-06-01 ENCOUNTER — Other Ambulatory Visit: Payer: Self-pay

## 2019-06-01 ENCOUNTER — Inpatient Hospital Stay (HOSPITAL_COMMUNITY): Payer: Medicare HMO | Admitting: Hematology

## 2019-06-01 VITALS — BP 110/66 | HR 55 | Temp 97.3°F | Resp 18 | Wt 233.0 lb

## 2019-06-01 DIAGNOSIS — Z7982 Long term (current) use of aspirin: Secondary | ICD-10-CM | POA: Diagnosis not present

## 2019-06-01 DIAGNOSIS — D696 Thrombocytopenia, unspecified: Secondary | ICD-10-CM | POA: Diagnosis not present

## 2019-06-01 DIAGNOSIS — E538 Deficiency of other specified B group vitamins: Secondary | ICD-10-CM | POA: Diagnosis not present

## 2019-06-01 DIAGNOSIS — M069 Rheumatoid arthritis, unspecified: Secondary | ICD-10-CM | POA: Diagnosis not present

## 2019-06-01 DIAGNOSIS — I252 Old myocardial infarction: Secondary | ICD-10-CM | POA: Diagnosis not present

## 2019-06-01 DIAGNOSIS — I509 Heart failure, unspecified: Secondary | ICD-10-CM | POA: Diagnosis not present

## 2019-06-01 DIAGNOSIS — Z79899 Other long term (current) drug therapy: Secondary | ICD-10-CM | POA: Diagnosis not present

## 2019-06-01 DIAGNOSIS — Z87891 Personal history of nicotine dependence: Secondary | ICD-10-CM | POA: Diagnosis not present

## 2019-06-01 DIAGNOSIS — E039 Hypothyroidism, unspecified: Secondary | ICD-10-CM | POA: Diagnosis not present

## 2019-06-01 MED ORDER — CYANOCOBALAMIN 1000 MCG/ML IJ SOLN
INTRAMUSCULAR | Status: AC
  Filled 2019-06-01: qty 1

## 2019-06-01 MED ORDER — CYANOCOBALAMIN 1000 MCG/ML IJ SOLN
1000.0000 ug | Freq: Once | INTRAMUSCULAR | Status: AC
  Administered 2019-06-01: 1000 ug via INTRAMUSCULAR

## 2019-06-01 NOTE — Progress Notes (Signed)
Pt given B12 injection in left deltoid. Pt tolerated injection well with no complaints. Pt stable and discharged home ambulatory.  °

## 2019-06-01 NOTE — Patient Instructions (Signed)
[facility] at [facility] Discharge Instructions  You were seen today by Dr. Ellin Saba. He went over your recent lab results. He gave you a B12 injection today and would like you to start taking B12 1mg  daily as well as vitamin D 1,000units daily. He will see you back in 3 months for labs and follow up.   Thank you for choosing [facility] at [facility] to provide your oncology and hematology care.  To afford each patient quality time with our provider, please arrive at least 15 minutes before your scheduled appointment time.   If you have a lab appointment with the Cancer Center please come in thru the  Main Entrance and check in at the main information desk  You need to re-schedule your appointment should you arrive 10 or more minutes late.  We strive to give you quality time with our providers, and arriving late affects you and other patients whose appointments are after yours.  Also, if you no show three or more times for appointments you may be dismissed from the clinic at the providers discretion.     Again, thank you for choosing [facility].  Our hope is that these requests will decrease the amount of time that you wait before being seen by our physicians.       _____________________________________________________________  Should you have questions after your visit to [facility], please contact our office at [phone] between the hours of 8:00 a.m. and 4:30 p.m.  Voicemails left after 4:00 p.m. will not be returned until the following business day.  For prescription refill requests, have your pharmacy contact our office and allow 72 hours.    Cancer Center Support Programs:   > Cancer Support Group  2nd Tuesday of the month 1pm-2pm, Journey Room

## 2019-06-01 NOTE — Assessment & Plan Note (Signed)
1.  Mild thrombocytopenia: -Patient had mild to moderate thrombocytopenia since December 2019. -Differential diagnosis includes medication induced thrombocytopenia versus immune mediated process. -He is on Kevzara for the last 2 to 3 years for rheumatoid arthritis.  Review of adverse effects indicated can potentially contribute to thrombocytopenia. -His folic acid levels were normal.  Vitamin B12 was borderline.  Methylmalonic acid was elevated. -Repeat platelet count on 05/11/2019 was 132.  No active intervention necessary. -We will plan on repeating his platelet count in 3 months.  If they continue to be stable, will switch him to every 6 to 12 months visits.  2.  Vitamin B12 deficiency: -His B12 level was borderline low today.  However methylmalonic acid was elevated at 526. -We will give him B12 injection today.  He will take B12 1 mg tablet daily.  3.  Vitamin D deficiency: -He has mildly low vitamin D level of 28.  He was told to take vitamin D 1000 units daily.  We will repeat it at next visit.  4.  Rheumatoid arthritis: -This affects all of his joints but predominantly upper part of the body.  He has been treated with various agents for rheumatoid arthritis. -There is best controlled with Kevzara for the last 2 to 3 years.
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
## 2019-06-04 ENCOUNTER — Other Ambulatory Visit: Payer: Self-pay | Admitting: Internal Medicine

## 2019-06-15 ENCOUNTER — Other Ambulatory Visit: Payer: Self-pay | Admitting: Cardiology
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
## 2019-06-16 ENCOUNTER — Other Ambulatory Visit: Payer: Self-pay

## 2019-06-16 ENCOUNTER — Telehealth: Payer: Self-pay

## 2019-06-16 ENCOUNTER — Ambulatory Visit (INDEPENDENT_AMBULATORY_CARE_PROVIDER_SITE_OTHER): Payer: Medicare HMO | Admitting: Gastroenterology

## 2019-06-16 ENCOUNTER — Encounter: Payer: Self-pay | Admitting: Gastroenterology

## 2019-06-16 DIAGNOSIS — D696 Thrombocytopenia, unspecified: Secondary | ICD-10-CM | POA: Diagnosis not present

## 2019-06-16 DIAGNOSIS — K2100 Gastro-esophageal reflux disease with esophagitis, without bleeding: Secondary | ICD-10-CM | POA: Diagnosis not present

## 2019-06-16 DIAGNOSIS — R945 Abnormal results of liver function studies: Secondary | ICD-10-CM

## 2019-06-16 DIAGNOSIS — R7989 Other specified abnormal findings of blood chemistry: Secondary | ICD-10-CM | POA: Insufficient documentation

## 2019-06-16 NOTE — Telephone Encounter (Signed)
[place] abd scheduled for 06/22/19 at 10:30am, arrive at 10:15am. NPO after midnight prior to test.   Called and informed pt of [place] appt. Letter mailed.

## 2019-06-16 NOTE — Patient Instructions (Signed)
1. Cut back on pantoprazole to 40 mg once daily before breakfast. 2. Abdominal ultrasound as scheduled.  We will contact you with results as available. 3. Return to the office in 1 year or call sooner if needed.

## 2019-06-22 ENCOUNTER — Other Ambulatory Visit: Payer: Self-pay | Admitting: Cardiology

## 2019-06-22 ENCOUNTER — Ambulatory Visit (HOSPITAL_COMMUNITY)
Admission: RE | Admit: 2019-06-22 | Discharge: 2019-06-22 | Disposition: A | Discharge status: Home / Self Care | Payer: Medicare HMO | Source: Ambulatory Visit | Attending: Gastroenterology | Admitting: Gastroenterology

## 2019-06-22 ENCOUNTER — Other Ambulatory Visit: Payer: Self-pay

## 2019-06-22 DIAGNOSIS — R945 Abnormal results of liver function studies: Secondary | ICD-10-CM | POA: Diagnosis not present

## 2019-06-22 DIAGNOSIS — D696 Thrombocytopenia, unspecified: Secondary | ICD-10-CM | POA: Insufficient documentation

## 2019-06-22 DIAGNOSIS — R7989 Other specified abnormal findings of blood chemistry: Secondary | ICD-10-CM | POA: Diagnosis not present

## 2019-06-30 ENCOUNTER — Telehealth: Payer: Self-pay | Admitting: *Deleted

## 2019-06-30 NOTE — Telephone Encounter (Signed)
Pt consented to a telephone visit on 06/16/19. 

## 2019-06-30 NOTE — Telephone Encounter (Signed)
Brendan [name], you are scheduled for a virtual visit with your provider today.  Just as we do with appointments in the office, we must obtain your consent to participate.  Your consent will be active for this visit and any virtual visit you may have with one of our providers in the next 365 days.  If you have a MyChart account, I can also send a copy of this consent to you electronically.  All virtual visits are billed to your insurance company just like a traditional visit in the office.  As this is a virtual visit, video technology does not allow for your provider to perform a traditional examination.  This may limit your provider's ability to fully assess your condition.  If your provider identifies any concerns that need to be evaluated in person or the need to arrange testing such as labs, EKG, etc, we will make arrangements to do so.  Although advances in technology are sophisticated, we cannot ensure that it will always work on either your end or our end.  If the connection with a video visit is poor, we may have to switch to a telephone visit.  With either a video or telephone visit, we are not always able to ensure that we have a secure connection.   I need to obtain your verbal consent now.   Are you willing to proceed with your visit today?

## 2019-07-06 ENCOUNTER — Other Ambulatory Visit: Payer: Self-pay | Admitting: *Deleted

## 2019-07-06 DIAGNOSIS — R7989 Other specified abnormal findings of blood chemistry: Secondary | ICD-10-CM

## 2019-07-28 DIAGNOSIS — R69 Illness, unspecified: Secondary | ICD-10-CM | POA: Diagnosis not present

## 2019-08-05 ENCOUNTER — Other Ambulatory Visit: Payer: Self-pay | Admitting: Cardiology

## 2019-08-31 ENCOUNTER — Inpatient Hospital Stay (HOSPITAL_COMMUNITY): Payer: Medicare HMO | Attending: Hematology

## 2019-08-31 ENCOUNTER — Other Ambulatory Visit: Payer: Self-pay

## 2019-08-31 DIAGNOSIS — E538 Deficiency of other specified B group vitamins: Secondary | ICD-10-CM | POA: Diagnosis not present

## 2019-08-31 DIAGNOSIS — E559 Vitamin D deficiency, unspecified: Secondary | ICD-10-CM | POA: Diagnosis not present

## 2019-08-31 DIAGNOSIS — D696 Thrombocytopenia, unspecified: Secondary | ICD-10-CM | POA: Diagnosis not present

## 2019-08-31 DIAGNOSIS — M069 Rheumatoid arthritis, unspecified: Secondary | ICD-10-CM | POA: Insufficient documentation

## 2019-08-31 LAB — CBC WITH DIFFERENTIAL/PLATELET
Abs Immature Granulocytes: 0 10*3/uL (ref 0.00–0.07)
Basophils Absolute: 0 10*3/uL (ref 0.0–0.1)
Basophils Relative: 1 %
Eosinophils Absolute: 0.2 10*3/uL (ref 0.0–0.5)
Eosinophils Relative: 5 %
HCT: 43.2 % (ref 39.0–52.0)
Hemoglobin: 15.2 g/dL (ref 13.0–17.0)
Immature Granulocytes: 0 %
Lymphocytes Relative: 35 %
Lymphs Abs: 1.3 10*3/uL (ref 0.7–4.0)
MCH: 32.8 pg (ref 26.0–34.0)
MCHC: 35.2 g/dL (ref 30.0–36.0)
MCV: 93.3 fL (ref 80.0–100.0)
Monocytes Absolute: 0.3 10*3/uL (ref 0.1–1.0)
Monocytes Relative: 9 %
Neutro Abs: 1.9 10*3/uL (ref 1.7–7.7)
Neutrophils Relative %: 50 %
Platelets: 113 10*3/uL — ABNORMAL LOW (ref 150–400)
RBC: 4.63 MIL/uL (ref 4.22–5.81)
RDW: 12.8 % (ref 11.5–15.5)
WBC: 3.7 10*3/uL — ABNORMAL LOW (ref 4.0–10.5)
nRBC: 0 % (ref 0.0–0.2)

## 2019-08-31 LAB — VITAMIN B12: Vitamin B-12: 946 pg/mL — ABNORMAL HIGH (ref 180–914)

## 2019-08-31 LAB — VITAMIN D 25 HYDROXY (VIT D DEFICIENCY, FRACTURES): Vit D, 25-Hydroxy: 34.94 ng/mL (ref 30–100)

## 2019-09-02 LAB — METHYLMALONIC ACID, SERUM: Methylmalonic Acid, Quantitative: 303 nmol/L (ref 0–378)

## 2019-09-07 ENCOUNTER — Other Ambulatory Visit: Payer: Self-pay

## 2019-09-07 ENCOUNTER — Inpatient Hospital Stay (HOSPITAL_COMMUNITY): Payer: Medicare HMO | Attending: Hematology | Admitting: Hematology

## 2019-09-07 VITALS — BP 115/59 | HR 56 | Temp 97.3°F | Resp 18 | Wt 232.6 lb

## 2019-09-07 DIAGNOSIS — D696 Thrombocytopenia, unspecified: Secondary | ICD-10-CM | POA: Insufficient documentation

## 2019-09-07 DIAGNOSIS — Z87891 Personal history of nicotine dependence: Secondary | ICD-10-CM | POA: Insufficient documentation

## 2019-09-07 DIAGNOSIS — M069 Rheumatoid arthritis, unspecified: Secondary | ICD-10-CM | POA: Diagnosis not present

## 2019-09-07 DIAGNOSIS — E559 Vitamin D deficiency, unspecified: Secondary | ICD-10-CM | POA: Insufficient documentation

## 2019-09-07 DIAGNOSIS — E538 Deficiency of other specified B group vitamins: Secondary | ICD-10-CM | POA: Diagnosis not present

## 2019-09-07 NOTE — Patient Instructions (Signed)
[facility] at [facility] Discharge Instructions  You were seen today by Dr. Ellin Saba. He went over your recent results. Monitor for worsening bleeding and call the office immediately. Dr. Ellin Saba will see you back in 6 months for labs and follow up.   Thank you for choosing  Cancer Center at [facility] to provide your oncology and hematology care.  To afford each patient quality time with our provider, please arrive at least 15 minutes before your scheduled appointment time.   If you have a lab appointment with the Cancer Center please come in thru the Main Entrance and check in at the main information desk  You need to re-schedule your appointment should you arrive 10 or more minutes late.  We strive to give you quality time with our providers, and arriving late affects you and other patients whose appointments are after yours.  Also, if you no show three or more times for appointments you may be dismissed from the clinic at the providers discretion.     Again, thank you for choosing [facility].  Our hope is that these requests will decrease the amount of time that you wait before being seen by our physicians.       _____________________________________________________________  Should you have questions after your visit to [facility], please contact our office at [phone] between the hours of 8:00 a.m. and 4:30 p.m.  Voicemails left after 4:00 p.m. will not be returned until the following business day.  For prescription refill requests, have your pharmacy contact our office and allow 72 hours.    Cancer Center Support Programs:   > Cancer Support Group  2nd Tuesday of the month 1pm-2pm, Journey Room
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
## 2019-10-05 DIAGNOSIS — M1991 Primary osteoarthritis, unspecified site: Secondary | ICD-10-CM | POA: Diagnosis not present

## 2019-10-05 DIAGNOSIS — I5022 Chronic systolic (congestive) heart failure: Secondary | ICD-10-CM | POA: Diagnosis not present

## 2019-10-05 DIAGNOSIS — M069 Rheumatoid arthritis, unspecified: Secondary | ICD-10-CM | POA: Diagnosis not present

## 2019-10-05 DIAGNOSIS — E039 Hypothyroidism, unspecified: Secondary | ICD-10-CM | POA: Diagnosis not present

## 2019-10-05 DIAGNOSIS — E7849 Other hyperlipidemia: Secondary | ICD-10-CM | POA: Diagnosis not present

## 2019-10-22 ENCOUNTER — Ambulatory Visit: Payer: Medicare HMO | Admitting: Cardiology

## 2019-10-22 ENCOUNTER — Other Ambulatory Visit: Payer: Self-pay | Admitting: Cardiology

## 2019-11-02 DIAGNOSIS — Z23 Encounter for immunization: Secondary | ICD-10-CM | POA: Diagnosis not present

## 2019-11-02 DIAGNOSIS — M8949 Other hypertrophic osteoarthropathy, multiple sites: Secondary | ICD-10-CM | POA: Diagnosis not present

## 2019-11-02 DIAGNOSIS — M1990 Unspecified osteoarthritis, unspecified site: Secondary | ICD-10-CM | POA: Diagnosis not present

## 2019-11-02 DIAGNOSIS — M057A Rheumatoid arthritis with rheumatoid factor of other specified site without organ or systems involvement: Secondary | ICD-10-CM | POA: Diagnosis not present

## 2019-11-03 ENCOUNTER — Other Ambulatory Visit: Payer: Self-pay

## 2019-11-03 ENCOUNTER — Other Ambulatory Visit (HOSPITAL_COMMUNITY): Payer: Self-pay | Admitting: Family

## 2019-11-03 ENCOUNTER — Ambulatory Visit (HOSPITAL_COMMUNITY)
Admission: RE | Admit: 2019-11-03 | Discharge: 2019-11-03 | Disposition: A | Discharge status: Home / Self Care | Payer: Medicare HMO | Source: Ambulatory Visit | Attending: Family | Admitting: Family

## 2019-11-03 DIAGNOSIS — M057A Rheumatoid arthritis with rheumatoid factor of other specified site without organ or systems involvement: Secondary | ICD-10-CM | POA: Diagnosis not present

## 2019-11-03 DIAGNOSIS — M150 Primary generalized (osteo)arthritis: Secondary | ICD-10-CM

## 2019-11-03 DIAGNOSIS — M159 Polyosteoarthritis, unspecified: Secondary | ICD-10-CM

## 2019-11-03 DIAGNOSIS — M160 Bilateral primary osteoarthritis of hip: Secondary | ICD-10-CM | POA: Diagnosis not present

## 2019-11-03 DIAGNOSIS — M8949 Other hypertrophic osteoarthropathy, multiple sites: Secondary | ICD-10-CM | POA: Diagnosis not present

## 2019-11-04 DIAGNOSIS — M1991 Primary osteoarthritis, unspecified site: Secondary | ICD-10-CM | POA: Diagnosis not present

## 2019-11-04 DIAGNOSIS — I5022 Chronic systolic (congestive) heart failure: Secondary | ICD-10-CM | POA: Diagnosis not present

## 2019-11-04 DIAGNOSIS — G894 Chronic pain syndrome: Secondary | ICD-10-CM | POA: Diagnosis not present

## 2019-11-04 DIAGNOSIS — I509 Heart failure, unspecified: Secondary | ICD-10-CM | POA: Diagnosis not present
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
## 2019-11-22 ENCOUNTER — Other Ambulatory Visit: Payer: Self-pay

## 2019-11-22 ENCOUNTER — Ambulatory Visit: Payer: Medicare HMO | Admitting: Physician Assistant

## 2019-11-22 ENCOUNTER — Encounter: Payer: Self-pay | Admitting: Physician Assistant

## 2019-11-22 VITALS — BP 128/76 | HR 56 | Ht 72.0 in | Wt 231.4 lb

## 2019-11-22 DIAGNOSIS — I428 Other cardiomyopathies: Secondary | ICD-10-CM | POA: Diagnosis not present

## 2019-11-22 DIAGNOSIS — I444 Left anterior fascicular block: Secondary | ICD-10-CM

## 2019-11-22 DIAGNOSIS — E785 Hyperlipidemia, unspecified: Secondary | ICD-10-CM | POA: Diagnosis not present

## 2019-11-22 NOTE — Patient Instructions (Addendum)
Medication Instructions: Your physician recommends that you continue on your current medications as directed. Please refer to the Current Medication list given to you today.   *If you need a refill on your cardiac medications before your next appointment, please call your pharmacy*   Lab Work: Tomorrow: BMET, FLP  If you have labs (blood work) drawn today and your tests are completely normal, you will receive your results only by:  MyChart Message (if you have MyChart) OR  A paper copy in the mail If you have any lab test that is abnormal or we need to change your treatment, we will call you to review the results.   Testing/Procedures: None   Follow-Up: At [facility], you and your health needs are our priority.  As part of our continuing mission to provide you with exceptional heart care, we have created designated Provider Care Teams.  These Care Teams include your primary Cardiologist (physician) and Advanced Practice Providers (APPs -  Physician Assistants and Nurse Practitioners) who all work together to provide you with the care you need, when you need it.  We recommend signing up for the patient portal called "MyChart".  Sign up information is provided on this After Visit Summary.  MyChart is used to connect with patients for Virtual Visits (Telemedicine).  Patients are able to view lab/test results, encounter notes, upcoming appointments, etc.  Non-urgent messages can be sent to your provider as well.   To learn more about what you can do with MyChart, go to [email or web address].    Your next appointment:   6 month(s)  The format for your next appointment:   In Person  Provider:   You may see Dr Wyline Mood or one of the following Advanced Practice Providers on your designated Care Team:    [place], PA-C   Jacolyn Reedy, [place]

## 2019-11-23 ENCOUNTER — Other Ambulatory Visit: Payer: Self-pay

## 2019-11-23 ENCOUNTER — Other Ambulatory Visit (HOSPITAL_COMMUNITY)
Admission: RE | Admit: 2019-11-23 | Discharge: 2019-11-23 | Disposition: A | Discharge status: Home / Self Care | Payer: Medicare HMO | Source: Ambulatory Visit | Attending: Physician Assistant | Admitting: Physician Assistant

## 2019-11-23 DIAGNOSIS — E785 Hyperlipidemia, unspecified: Secondary | ICD-10-CM | POA: Insufficient documentation

## 2019-11-23 DIAGNOSIS — I428 Other cardiomyopathies: Secondary | ICD-10-CM | POA: Insufficient documentation

## 2019-11-23 LAB — LIPID PANEL
Cholesterol: 190 mg/dL (ref 0–200)
HDL: 49 mg/dL
LDL Cholesterol: 121 mg/dL — ABNORMAL HIGH (ref 0–99)
Total CHOL/HDL Ratio: 3.9 ratio
Triglycerides: 99 mg/dL
VLDL: 20 mg/dL (ref 0–40)

## 2019-11-23 LAB — BASIC METABOLIC PANEL
Anion gap: 10 (ref 5–15)
BUN: 14 mg/dL (ref 8–23)
CO2: 27 mmol/L (ref 22–32)
Calcium: 9.1 mg/dL (ref 8.9–10.3)
Chloride: 103 mmol/L (ref 98–111)
Creatinine, Ser: 0.9 mg/dL (ref 0.61–1.24)
GFR, Estimated: >60 mL/min (ref >60)
Glucose, Bld: 125 mg/dL — ABNORMAL HIGH (ref 70–99)
Potassium: 4 mmol/L (ref 3.5–5.1)
Sodium: 140 mmol/L (ref 135–145)

## 2019-11-24 ENCOUNTER — Telehealth: Payer: Self-pay | Admitting: *Deleted

## 2019-11-24 DIAGNOSIS — E785 Hyperlipidemia, unspecified: Secondary | ICD-10-CM

## 2019-11-24 MED ORDER — ATORVASTATIN CALCIUM 10 MG PO TABS: 10.0000 mg | ORAL_TABLET | Freq: Every day | ORAL | 3 refills | Status: DC

## 2019-11-24 NOTE — Telephone Encounter (Signed)
Pt notified orders placed  

## 2019-11-24 NOTE — Telephone Encounter (Signed)
-----   Message from Dyann Kief, PA-C sent at 11/23/2019 12:18 PM EDT ----- LDL 121-should be less than 90. Could try low dose lipitor 10 mg once daily and repeat FLP and LFT's in 3 months.

## 2019-11-30 ENCOUNTER — Other Ambulatory Visit: Payer: Self-pay | Admitting: Gastroenterology

## 2019-12-01 DIAGNOSIS — R69 Illness, unspecified: Secondary | ICD-10-CM | POA: Diagnosis not present

## 2019-12-04 DIAGNOSIS — I5022 Chronic systolic (congestive) heart failure: Secondary | ICD-10-CM | POA: Diagnosis not present

## 2019-12-04 DIAGNOSIS — I509 Heart failure, unspecified: Secondary | ICD-10-CM | POA: Diagnosis not present

## 2019-12-04 DIAGNOSIS — M1991 Primary osteoarthritis, unspecified site: Secondary | ICD-10-CM | POA: Diagnosis not present

## 2019-12-04 DIAGNOSIS — G894 Chronic pain syndrome: Secondary | ICD-10-CM | POA: Diagnosis not present

## 2020-01-04 DIAGNOSIS — I509 Heart failure, unspecified: Secondary | ICD-10-CM | POA: Diagnosis not present

## 2020-01-04 DIAGNOSIS — G894 Chronic pain syndrome: Secondary | ICD-10-CM | POA: Diagnosis not present

## 2020-01-04 DIAGNOSIS — M1991 Primary osteoarthritis, unspecified site: Secondary | ICD-10-CM | POA: Diagnosis not present

## 2020-01-04 DIAGNOSIS — I5022 Chronic systolic (congestive) heart failure: Secondary | ICD-10-CM | POA: Diagnosis not present

## 2020-02-16 DIAGNOSIS — M87852 Other osteonecrosis, left femur: Secondary | ICD-10-CM | POA: Diagnosis not present

## 2020-02-23 ENCOUNTER — Telehealth: Payer: Self-pay | Admitting: Cardiology

## 2020-02-23 NOTE — Telephone Encounter (Signed)
   [facility] Pre-operative Risk Assessment    HEARTCARE STAFF: - Please ensure there is not already an duplicate clearance open for this procedure. - Under Visit Info/Reason for Call, type in Other and utilize the format Clearance MM/DD/YY or Clearance TBD. Do not use dashes or single digits. - If request is for dental extraction, please clarify the # of teeth to be extracted.  Request for surgical clearance:  1. What type of surgery is being performed? Left total hip arthoplasty  2. When is this surgery scheduled? 03/28/20  3. What type of clearance is required (medical clearance vs. Pharmacy clearance to hold med vs. Both)? both  4. Are there any medications that need to be held prior to surgery and how long? aspirin  5. Practice name and name of physician performing surgery?  [facility] dr Paralee Cancel  6. What is the office phone number? [phone]    7.   What is the office fax number? [phone]  8.   Anesthesia type (None, local, MAC, general) ? spinal   Jannet Askew 02/23/2020, 12:32 PM  _________________________________________________________________   (provider comments below)

## 2020-02-24 ENCOUNTER — Other Ambulatory Visit: Payer: Self-pay

## 2020-02-24 ENCOUNTER — Other Ambulatory Visit (HOSPITAL_COMMUNITY)
Admission: RE | Admit: 2020-02-24 | Discharge: 2020-02-24 | Disposition: A | Discharge status: Home / Self Care | Payer: Medicare HMO | Source: Ambulatory Visit | Attending: Physician Assistant | Admitting: Physician Assistant

## 2020-02-24 DIAGNOSIS — E785 Hyperlipidemia, unspecified: Secondary | ICD-10-CM | POA: Diagnosis not present

## 2020-02-24 LAB — LIPID PANEL
Cholesterol: 136 mg/dL (ref 0–200)
HDL: 48 mg/dL (ref >40)
LDL Cholesterol: 72 mg/dL (ref 0–99)
Total CHOL/HDL Ratio: 2.8 RATIO
Triglycerides: 78 mg/dL (ref <150)
VLDL: 16 mg/dL (ref 0–40)

## 2020-02-24 LAB — HEPATIC FUNCTION PANEL
ALT: 51 U/L — ABNORMAL HIGH (ref 0–44)
AST: 26 U/L (ref 15–41)
Albumin: 3.9 g/dL (ref 3.5–5.0)
Alkaline Phosphatase: 34 U/L — ABNORMAL LOW (ref 38–126)
Bilirubin, Direct: 0.1 mg/dL (ref 0.0–0.2)
Indirect Bilirubin: 0.8 mg/dL (ref 0.3–0.9)
Total Bilirubin: 0.9 mg/dL (ref 0.3–1.2)
Total Protein: 6.6 g/dL (ref 6.5–8.1)

## 2020-02-24 NOTE — Telephone Encounter (Signed)
   Primary Cardiologist: No primary care provider on file.  Chart reviewed as part of pre-operative protocol coverage. Patient was contacted 02/24/2020 in reference to pre-operative risk assessment for pending surgery as outlined below.  BEN HABERMANN has a 65 yo male with history of rheumatoid arthritis, thrombocytopenia, nonischemic cardiomyopathy (01/2018 Ef 25-30%, 01/2019 EF 40-45%0. Cardiac cath 01/2018 with no coronary artery disease. Last seen 11/22/19 by Herma Carson, PA doing well from cardiac perspective. As he has no known coronary artery disease, he may hold aspirin 5-7 days prior to planned procedure.   Therefore, based on ACC/AHA guidelines, the patient would be at acceptable risk for the planned procedure without further cardiovascular testing.   The patient was advised that if he develops new symptoms prior to surgery to contact our office to arrange for a follow-up visit, and he verbalized understanding.  I will route this recommendation to the requesting party via Epic fax function and remove from pre-op pool. Please call with questions.  Alver Sorrow, NP 02/24/2020, 11:32 AM

## 2020-02-25 ENCOUNTER — Telehealth: Payer: Self-pay

## 2020-02-25 NOTE — Telephone Encounter (Signed)
Patient contacted and verbalized understanding. Requested that a copy of his results be sent to PCP's office.

## 2020-03-04 DIAGNOSIS — G894 Chronic pain syndrome: Secondary | ICD-10-CM | POA: Diagnosis not present

## 2020-03-04 DIAGNOSIS — I5022 Chronic systolic (congestive) heart failure: Secondary | ICD-10-CM | POA: Diagnosis not present

## 2020-03-04 DIAGNOSIS — M1991 Primary osteoarthritis, unspecified site: Secondary | ICD-10-CM | POA: Diagnosis not present

## 2020-03-04 DIAGNOSIS — I509 Heart failure, unspecified: Secondary | ICD-10-CM | POA: Diagnosis not present

## 2020-03-08 DIAGNOSIS — Z6832 Body mass index (BMI) 32.0-32.9, adult: Secondary | ICD-10-CM | POA: Diagnosis not present

## 2020-03-08 DIAGNOSIS — K219 Gastro-esophageal reflux disease without esophagitis: Secondary | ICD-10-CM | POA: Diagnosis not present

## 2020-03-08 DIAGNOSIS — Z1331 Encounter for screening for depression: Secondary | ICD-10-CM | POA: Diagnosis not present

## 2020-03-08 DIAGNOSIS — I214 Non-ST elevation (NSTEMI) myocardial infarction: Secondary | ICD-10-CM | POA: Diagnosis not present

## 2020-03-08 DIAGNOSIS — E063 Autoimmune thyroiditis: Secondary | ICD-10-CM | POA: Diagnosis not present

## 2020-03-08 DIAGNOSIS — E7849 Other hyperlipidemia: Secondary | ICD-10-CM | POA: Diagnosis not present

## 2020-03-08 DIAGNOSIS — I5022 Chronic systolic (congestive) heart failure: Secondary | ICD-10-CM | POA: Diagnosis not present

## 2020-03-08 DIAGNOSIS — I509 Heart failure, unspecified: Secondary | ICD-10-CM | POA: Diagnosis not present

## 2020-03-08 DIAGNOSIS — M1991 Primary osteoarthritis, unspecified site: Secondary | ICD-10-CM | POA: Diagnosis not present

## 2020-03-08 DIAGNOSIS — M0689 Other specified rheumatoid arthritis, multiple sites: Secondary | ICD-10-CM | POA: Diagnosis not present

## 2020-03-08 DIAGNOSIS — G8929 Other chronic pain: Secondary | ICD-10-CM | POA: Diagnosis not present

## 2020-03-14 ENCOUNTER — Other Ambulatory Visit: Payer: Self-pay

## 2020-03-14 ENCOUNTER — Inpatient Hospital Stay (HOSPITAL_COMMUNITY): Payer: Medicare HMO | Attending: Hematology

## 2020-03-14 DIAGNOSIS — Z79899 Other long term (current) drug therapy: Secondary | ICD-10-CM | POA: Diagnosis not present

## 2020-03-14 DIAGNOSIS — D696 Thrombocytopenia, unspecified: Secondary | ICD-10-CM | POA: Diagnosis present

## 2020-03-14 DIAGNOSIS — Z87891 Personal history of nicotine dependence: Secondary | ICD-10-CM | POA: Insufficient documentation

## 2020-03-14 DIAGNOSIS — E559 Vitamin D deficiency, unspecified: Secondary | ICD-10-CM | POA: Insufficient documentation

## 2020-03-14 DIAGNOSIS — E538 Deficiency of other specified B group vitamins: Secondary | ICD-10-CM | POA: Insufficient documentation

## 2020-03-14 DIAGNOSIS — M069 Rheumatoid arthritis, unspecified: Secondary | ICD-10-CM | POA: Diagnosis not present

## 2020-03-14 LAB — CBC WITH DIFFERENTIAL/PLATELET
Abs Immature Granulocytes: 0.01 10*3/uL (ref 0.00–0.07)
Basophils Absolute: 0 10*3/uL (ref 0.0–0.1)
Basophils Relative: 1 %
Eosinophils Absolute: 0.2 10*3/uL (ref 0.0–0.5)
Eosinophils Relative: 5 %
HCT: 44.1 % (ref 39.0–52.0)
Hemoglobin: 15.3 g/dL (ref 13.0–17.0)
Immature Granulocytes: 0 %
Lymphocytes Relative: 24 %
Lymphs Abs: 1.2 10*3/uL (ref 0.7–4.0)
MCH: 33 pg (ref 26.0–34.0)
MCHC: 34.7 g/dL (ref 30.0–36.0)
MCV: 95 fL (ref 80.0–100.0)
Monocytes Absolute: 0.5 10*3/uL (ref 0.1–1.0)
Monocytes Relative: 10 %
Neutro Abs: 2.9 10*3/uL (ref 1.7–7.7)
Neutrophils Relative %: 60 %
Platelets: 110 10*3/uL — ABNORMAL LOW (ref 150–400)
RBC: 4.64 MIL/uL (ref 4.22–5.81)
RDW: 12.9 % (ref 11.5–15.5)
WBC: 4.8 10*3/uL (ref 4.0–10.5)
nRBC: 0 % (ref 0.0–0.2)

## 2020-03-14 LAB — LACTATE DEHYDROGENASE: LDH: 115 U/L (ref 98–192)

## 2020-03-14 LAB — VITAMIN D 25 HYDROXY (VIT D DEFICIENCY, FRACTURES): Vit D, 25-Hydroxy: 33.59 ng/mL (ref 30–100)

## 2020-03-14 LAB — VITAMIN B12: Vitamin B-12: 826 pg/mL (ref 180–914)

## 2020-03-15 DIAGNOSIS — M05711 Rheumatoid arthritis with rheumatoid factor of right shoulder without organ or systems involvement: Secondary | ICD-10-CM | POA: Diagnosis not present

## 2020-03-21 ENCOUNTER — Inpatient Hospital Stay (HOSPITAL_BASED_OUTPATIENT_CLINIC_OR_DEPARTMENT_OTHER): Payer: Medicare HMO | Admitting: Hematology

## 2020-03-21 ENCOUNTER — Other Ambulatory Visit: Payer: Self-pay

## 2020-03-21 VITALS — BP 109/58 | HR 53 | Temp 96.8°F | Resp 18 | Wt 238.8 lb

## 2020-03-21 DIAGNOSIS — D696 Thrombocytopenia, unspecified: Secondary | ICD-10-CM

## 2020-03-21 DIAGNOSIS — M069 Rheumatoid arthritis, unspecified: Secondary | ICD-10-CM | POA: Diagnosis not present

## 2020-03-21 DIAGNOSIS — E559 Vitamin D deficiency, unspecified: Secondary | ICD-10-CM | POA: Diagnosis not present

## 2020-03-21 DIAGNOSIS — Z79899 Other long term (current) drug therapy: Secondary | ICD-10-CM | POA: Diagnosis not present

## 2020-03-21 DIAGNOSIS — Z87891 Personal history of nicotine dependence: Secondary | ICD-10-CM | POA: Diagnosis not present

## 2020-03-21 DIAGNOSIS — E538 Deficiency of other specified B group vitamins: Secondary | ICD-10-CM | POA: Diagnosis not present

## 2020-03-21 NOTE — Patient Instructions (Signed)
[facility] at [facility] Discharge Instructions  You were seen today by Dr. Ellin Saba. He went over your recent results. You may proceed with your hip replacement surgery. Dr. Ellin Saba will see you back in 6 months for labs and follow up.   Thank you for choosing [facility] at [facility] to provide your oncology and hematology care.  To afford each patient quality time with our provider, please arrive at least 15 minutes before your scheduled appointment time.   If you have a lab appointment with the Cancer Center please come in thru the Main Entrance and check in at the main information desk  You need to re-schedule your appointment should you arrive 10 or more minutes late.  We strive to give you quality time with our providers, and arriving late affects you and other patients whose appointments are after yours.  Also, if you no show three or more times for appointments you may be dismissed from the clinic at the providers discretion.     Again, thank you for choosing [facility].  Our hope is that these requests will decrease the amount of time that you wait before being seen by our physicians.       _____________________________________________________________  Should you have questions after your visit to [facility], please contact our office at [phone] between the hours of 8:00 a.m. and 4:30 p.m.  Voicemails left after 4:00 p.m. will not be returned until the following business day.  For prescription refill requests, have your pharmacy contact our office and allow 72 hours.    Cancer Center Support Programs:   > Cancer Support Group  2nd Tuesday of the month 1pm-2pm, Journey Room
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
## 2020-03-21 NOTE — Progress Notes (Signed)
DUE TO COVID-19 ONLY ONE VISITOR IS ALLOWED TO COME WITH YOU AND STAY IN THE WAITING ROOM ONLY DURING PRE OP AND PROCEDURE DAY OF SURGERY. THE 1 VISITOR  MAY VISIT WITH YOU AFTER SURGERY IN YOUR PRIVATE ROOM DURING VISITING HOURS ONLY!  YOU NEED TO HAVE A COVID 19 TEST ON___2/18/2022 ____ @_______ , THIS TEST MUST BE DONE BEFORE SURGERY,  COVID TESTING SITE [address] [place] , IT IS ON THE RIGHT GOING OUT [address] APPROXIMATELY  2 MINUTES PAST [employer] ON THE RIGHT. ONCE YOUR COVID TEST IS COMPLETED,  PLEASE BEGIN THE QUARANTINE INSTRUCTIONS AS OUTLINED IN YOUR HANDOUT.                Brendan [name]  03/21/2020   Your procedure is scheduled on: 03/28/2020    Report to [facility] Main  Entrance   Report to admitting at     [phone]      Call this number if you have problems the morning of surgery [phone]    REMEMBER: NO  SOLID FOOD CANDY OR GUM AFTER MIDNIGHT. CLEAR LIQUIDS UNTIL  0820AM        . NOTHING BY MOUTH EXCEPT CLEAR LIQUIDS UNTIL    . PLEASE FINISH ENSURE DRINK PER SURGEON ORDER  WHICH NEEDS TO BE COMPLETED AT      .  01-14-1998     CLEAR LIQUID DIET   Foods Allowed                                                                    Coffee and tea, regular and decaf                            Fruit ices (not with fruit pulp)                                      Iced Popsicles                                    Carbonated beverages, regular and diet                                    Cranberry, grape and apple juices Sports drinks like Gatorade Lightly seasoned clear broth or consume(fat free) Sugar, honey syrup ___________________________________________________________________      BRUSH YOUR TEETH MORNING OF SURGERY AND RINSE YOUR MOUTH OUT, NO CHEWING GUM CANDY OR MINTS.     Take these medicines the morning of surgery with A SIP OF WATER: COREG, SYNTHROID, PROTONIX   DO NOT TAKE ANY DIABETIC MEDICATIONS DAY OF  YOUR SURGERY                               You may not have any metal on your body including hair pins and              piercings  Do not wear jewelry, make-up, lotions, powders or perfumes, deodorant             Do not wear nail polish on your fingernails.  Do not shave  48 hours prior to surgery.              Men may shave face and neck.   Do not bring valuables to the hospital. [place].  Contacts, dentures or bridgework may not be worn into surgery.  Leave suitcase in the car. After surgery it may be brought to your room.     Patients discharged the day of surgery will not be allowed to drive home. IF YOU ARE HAVING SURGERY AND GOING HOME THE SAME DAY, YOU MUST HAVE AN ADULT TO DRIVE YOU HOME AND BE WITH YOU FOR 24 HOURS. YOU MAY GO HOME BY TAXI OR UBER OR ORTHERWISE, BUT AN ADULT MUST ACCOMPANY YOU HOME AND STAY WITH YOU FOR 24 HOURS.  Name and phone number of your driver:  Special Instructions: N/A              Please read over the following fact sheets you were given: _____________________________________________________________________  [facility] - Preparing for Surgery Before surgery, you can play an important role.  Because skin is not sterile, your skin needs to be as free of germs as possible.  You can reduce the number of germs on your skin by washing with CHG (chlorahexidine gluconate) soap before surgery.  CHG is an antiseptic cleaner which kills germs and bonds with the skin to continue killing germs even after washing. Please DO NOT use if you have an allergy to CHG or antibacterial soaps.  If your skin becomes reddened/irritated stop using the CHG and inform your nurse when you arrive at Short Stay. Do not shave (including legs and underarms) for at least 48 hours prior to the first CHG shower.  You may shave your face/neck. Please follow these instructions carefully:  1.  Shower with CHG Soap the night before surgery and  the  morning of Surgery.  2.  If you choose to wash your hair, wash your hair first as usual with your  normal  shampoo.  3.  After you shampoo, rinse your hair and body thoroughly to remove the  shampoo.                           4.  Use CHG as you would any other liquid soap.  You can apply chg directly  to the skin and wash                       Gently with a scrungie or clean washcloth.  5.  Apply the CHG Soap to your body ONLY FROM THE NECK DOWN.   Do not use on face/ open                           Wound or open sores. Avoid contact with eyes, ears mouth and genitals (private parts).                       Wash face,  Genitals (private parts) with your normal soap.             6.  Wash  thoroughly, paying special attention to the area where your surgery  will be performed.  7.  Thoroughly rinse your body with warm water from the neck down.  8.  DO NOT shower/wash with your normal soap after using and rinsing off  the CHG Soap.                9.  Pat yourself dry with a clean towel.            10.  Wear clean pajamas.            11.  Place clean sheets on your bed the night of your first shower and do not  sleep with pets. Day of Surgery : Do not apply any lotions/deodorants the morning of surgery.  Please wear clean clothes to the hospital/surgery center.  FAILURE TO FOLLOW THESE INSTRUCTIONS MAY RESULT IN THE CANCELLATION OF YOUR SURGERY PATIENT SIGNATURE_________________________________  NURSE SIGNATURE__________________________________  ________________________________________________________________________

## 2020-03-22 ENCOUNTER — Other Ambulatory Visit (HOSPITAL_COMMUNITY): Payer: Self-pay

## 2020-03-22 DIAGNOSIS — D696 Thrombocytopenia, unspecified: Secondary | ICD-10-CM

## 2020-03-22 NOTE — Progress Notes (Signed)
Anesthesia Review:  PCP: Cardiologist : DR Bary Castilla clearance- 02/23/20 LOV 11/22/2019  Chest x-ray : EKG : 11/22/2019  Echo : 2020  Stress test: Cardiac Cath : 2019  Activity level:  Sleep Study/ CPAP : Fasting Blood Sugar :      / Checks Blood Sugar -- times a day:   Blood Thinner/ Instructions /Last Dose: ASA / Instructions/ Last Dose :  81 mg Aspirin

## 2020-03-23 ENCOUNTER — Other Ambulatory Visit: Payer: Self-pay

## 2020-03-23 ENCOUNTER — Encounter (HOSPITAL_COMMUNITY): Payer: Self-pay

## 2020-03-23 ENCOUNTER — Encounter (HOSPITAL_COMMUNITY)
Admission: RE | Admit: 2020-03-23 | Discharge: 2020-03-23 | Disposition: A | Discharge status: Home / Self Care | Payer: Medicare HMO | Source: Ambulatory Visit | Attending: Orthopedic Surgery | Admitting: Orthopedic Surgery

## 2020-03-23 DIAGNOSIS — Z7952 Long term (current) use of systemic steroids: Secondary | ICD-10-CM | POA: Diagnosis not present

## 2020-03-23 DIAGNOSIS — Z01812 Encounter for preprocedural laboratory examination: Secondary | ICD-10-CM | POA: Insufficient documentation

## 2020-03-23 DIAGNOSIS — Z79899 Other long term (current) drug therapy: Secondary | ICD-10-CM | POA: Diagnosis not present

## 2020-03-23 DIAGNOSIS — Z79891 Long term (current) use of opiate analgesic: Secondary | ICD-10-CM | POA: Diagnosis not present

## 2020-03-23 DIAGNOSIS — M069 Rheumatoid arthritis, unspecified: Secondary | ICD-10-CM | POA: Diagnosis not present

## 2020-03-23 DIAGNOSIS — I509 Heart failure, unspecified: Secondary | ICD-10-CM | POA: Diagnosis not present

## 2020-03-23 DIAGNOSIS — Z7982 Long term (current) use of aspirin: Secondary | ICD-10-CM | POA: Diagnosis not present

## 2020-03-23 DIAGNOSIS — D696 Thrombocytopenia, unspecified: Secondary | ICD-10-CM | POA: Diagnosis not present

## 2020-03-23 DIAGNOSIS — Z79890 Hormone replacement therapy (postmenopausal): Secondary | ICD-10-CM | POA: Diagnosis not present

## 2020-03-23 DIAGNOSIS — K219 Gastro-esophageal reflux disease without esophagitis: Secondary | ICD-10-CM | POA: Insufficient documentation

## 2020-03-23 DIAGNOSIS — M87052 Idiopathic aseptic necrosis of left femur: Secondary | ICD-10-CM | POA: Insufficient documentation

## 2020-03-23 HISTORY — DX: Atherosclerotic heart disease of native coronary artery without angina pectoris: I25.10

## 2020-03-23 HISTORY — DX: Gastro-esophageal reflux disease without esophagitis: K21.9

## 2020-03-23 LAB — SURGICAL PCR SCREEN
MRSA, PCR: NEGATIVE
Staphylococcus aureus: POSITIVE — AB

## 2020-03-23 LAB — BASIC METABOLIC PANEL
Anion gap: 10 (ref 5–15)
BUN: 18 mg/dL (ref 8–23)
CO2: 28 mmol/L (ref 22–32)
Calcium: 8.8 mg/dL — ABNORMAL LOW (ref 8.9–10.3)
Chloride: 101 mmol/L (ref 98–111)
Creatinine, Ser: 0.99 mg/dL (ref 0.61–1.24)
GFR, Estimated: >60 mL/min (ref >60)
Glucose, Bld: 126 mg/dL — ABNORMAL HIGH (ref 70–99)
Potassium: 3.9 mmol/L (ref 3.5–5.1)
Sodium: 139 mmol/L (ref 135–145)

## 2020-03-23 LAB — CBC
HCT: 42.3 % (ref 39.0–52.0)
Hemoglobin: 14.8 g/dL (ref 13.0–17.0)
MCH: 32.5 pg (ref 26.0–34.0)
MCHC: 35 g/dL (ref 30.0–36.0)
MCV: 92.8 fL (ref 80.0–100.0)
Platelets: 175 10*3/uL (ref 150–400)
RBC: 4.56 MIL/uL (ref 4.22–5.81)
RDW: 12.6 % (ref 11.5–15.5)
WBC: 6.8 10*3/uL (ref 4.0–10.5)
nRBC: 0 % (ref 0.0–0.2)

## 2020-03-24 ENCOUNTER — Other Ambulatory Visit (HOSPITAL_COMMUNITY)
Admission: RE | Admit: 2020-03-24 | Discharge: 2020-03-24 | Disposition: A | Discharge status: Home / Self Care | Payer: Medicare HMO | Source: Ambulatory Visit | Attending: Orthopedic Surgery | Admitting: Orthopedic Surgery

## 2020-03-24 DIAGNOSIS — Z20822 Contact with and (suspected) exposure to covid-19: Secondary | ICD-10-CM | POA: Insufficient documentation

## 2020-03-24 DIAGNOSIS — Z01812 Encounter for preprocedural laboratory examination: Secondary | ICD-10-CM | POA: Insufficient documentation
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
## 2020-03-25 LAB — SARS CORONAVIRUS 2 (TAT 6-24 HRS): SARS Coronavirus 2: NEGATIVE
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
## 2020-03-28 ENCOUNTER — Other Ambulatory Visit: Payer: Self-pay

## 2020-03-28 ENCOUNTER — Ambulatory Visit (HOSPITAL_COMMUNITY): Payer: Medicare HMO | Admitting: Certified Registered Nurse Anesthetist

## 2020-03-28 ENCOUNTER — Ambulatory Visit (HOSPITAL_COMMUNITY): Payer: Medicare HMO | Admitting: Physician Assistant

## 2020-03-28 ENCOUNTER — Ambulatory Visit (HOSPITAL_COMMUNITY): Payer: Medicare HMO

## 2020-03-28 ENCOUNTER — Observation Stay (HOSPITAL_COMMUNITY)
Admission: RE | Admit: 2020-03-28 | Discharge: 2020-03-29 | Disposition: A | Discharge status: Home / Self Care | Payer: Medicare HMO | Attending: Orthopedic Surgery | Admitting: Orthopedic Surgery

## 2020-03-28 ENCOUNTER — Encounter (HOSPITAL_COMMUNITY)
Admission: RE | Disposition: A | Discharge status: Home / Self Care | Payer: Self-pay | Source: Home / Self Care | Attending: Orthopedic Surgery

## 2020-03-28 ENCOUNTER — Encounter (HOSPITAL_COMMUNITY): Payer: Self-pay | Admitting: Orthopedic Surgery

## 2020-03-28 ENCOUNTER — Observation Stay (HOSPITAL_COMMUNITY): Payer: Medicare HMO

## 2020-03-28 DIAGNOSIS — E669 Obesity, unspecified: Secondary | ICD-10-CM | POA: Diagnosis present

## 2020-03-28 DIAGNOSIS — Z7982 Long term (current) use of aspirin: Secondary | ICD-10-CM | POA: Diagnosis not present

## 2020-03-28 DIAGNOSIS — Z87891 Personal history of nicotine dependence: Secondary | ICD-10-CM | POA: Insufficient documentation

## 2020-03-28 DIAGNOSIS — I2510 Atherosclerotic heart disease of native coronary artery without angina pectoris: Secondary | ICD-10-CM | POA: Diagnosis not present

## 2020-03-28 DIAGNOSIS — E039 Hypothyroidism, unspecified: Secondary | ICD-10-CM | POA: Insufficient documentation

## 2020-03-28 DIAGNOSIS — I509 Heart failure, unspecified: Secondary | ICD-10-CM | POA: Diagnosis not present

## 2020-03-28 DIAGNOSIS — M1612 Unilateral primary osteoarthritis, left hip: Secondary | ICD-10-CM | POA: Diagnosis not present

## 2020-03-28 DIAGNOSIS — Z419 Encounter for procedure for purposes other than remedying health state, unspecified: Secondary | ICD-10-CM

## 2020-03-28 DIAGNOSIS — Z96642 Presence of left artificial hip joint: Secondary | ICD-10-CM

## 2020-03-28 DIAGNOSIS — M25552 Pain in left hip: Secondary | ICD-10-CM | POA: Diagnosis present

## 2020-03-28 DIAGNOSIS — M1611 Unilateral primary osteoarthritis, right hip: Secondary | ICD-10-CM | POA: Diagnosis not present

## 2020-03-28 DIAGNOSIS — Z96649 Presence of unspecified artificial hip joint: Secondary | ICD-10-CM

## 2020-03-28 DIAGNOSIS — Z471 Aftercare following joint replacement surgery: Secondary | ICD-10-CM | POA: Diagnosis not present

## 2020-03-28 HISTORY — PX: TOTAL HIP ARTHROPLASTY: SHX124

## 2020-03-28 LAB — TYPE AND SCREEN
ABO/RH(D): O NEG
Antibody Screen: NEGATIVE

## 2020-03-28 LAB — ABO/RH: ABO/RH(D): O NEG

## 2020-03-28 SURGERY — ARTHROPLASTY, HIP, TOTAL, ANTERIOR APPROACH
Anesthesia: Spinal | Site: Hip | Laterality: Left

## 2020-03-28 MED ORDER — OXYCODONE HCL 5 MG PO TABS: 5.0000 mg | ORAL_TABLET | Freq: Once | ORAL | Status: DC | PRN

## 2020-03-28 MED ORDER — FENTANYL CITRATE (PF) 100 MCG/2ML IJ SOLN
INTRAMUSCULAR | Status: AC
  Filled 2020-03-28: qty 2

## 2020-03-28 MED ORDER — MENTHOL 3 MG MT LOZG: 1.0000 | LOZENGE | OROMUCOSAL | Status: DC | PRN

## 2020-03-28 MED ORDER — PHENYLEPHRINE HCL (PRESSORS) 10 MG/ML IV SOLN
INTRAVENOUS | Status: AC
  Filled 2020-03-28: qty 1

## 2020-03-28 MED ORDER — PROPOFOL 500 MG/50ML IV EMUL
INTRAVENOUS | Status: DC | PRN
  Administered 2020-03-28: 75 ug/kg/min via INTRAVENOUS

## 2020-03-28 MED ORDER — METOCLOPRAMIDE HCL 5 MG PO TABS: 5.0000 mg | ORAL_TABLET | Freq: Three times a day (TID) | ORAL | Status: DC | PRN

## 2020-03-28 MED ORDER — ONDANSETRON HCL 4 MG/2ML IJ SOLN: 4.0000 mg | Freq: Four times a day (QID) | INTRAMUSCULAR | Status: DC | PRN

## 2020-03-28 MED ORDER — POLYETHYLENE GLYCOL 3350 17 G PO PACK
17.0000 g | PACK | Freq: Two times a day (BID) | ORAL | Status: DC
  Administered 2020-03-28 – 2020-03-29 (×2): 17 g via ORAL
  Filled 2020-03-28 (×2): qty 1

## 2020-03-28 MED ORDER — CARVEDILOL 3.125 MG PO TABS: 3.1250 mg | ORAL_TABLET | ORAL | Status: DC

## 2020-03-28 MED ORDER — ATORVASTATIN CALCIUM 10 MG PO TABS
10.0000 mg | ORAL_TABLET | Freq: Every day | ORAL | Status: DC
  Administered 2020-03-28 – 2020-03-29 (×2): 10 mg via ORAL
  Filled 2020-03-28 (×2): qty 1

## 2020-03-28 MED ORDER — SPIRONOLACTONE 12.5 MG HALF TABLET
12.5000 mg | ORAL_TABLET | Freq: Every day | ORAL | Status: DC
  Administered 2020-03-29: 12.5 mg via ORAL
  Filled 2020-03-28: qty 1

## 2020-03-28 MED ORDER — CARVEDILOL 6.25 MG PO TABS
6.2500 mg | ORAL_TABLET | Freq: Every day | ORAL | Status: DC
  Administered 2020-03-28: 6.25 mg via ORAL
  Filled 2020-03-28: qty 1

## 2020-03-28 MED ORDER — ACETAMINOPHEN 325 MG PO TABS: 325.0000 mg | ORAL_TABLET | Freq: Four times a day (QID) | ORAL | Status: DC | PRN

## 2020-03-28 MED ORDER — ONDANSETRON HCL 4 MG/2ML IJ SOLN: 4.0000 mg | Freq: Once | INTRAMUSCULAR | Status: DC | PRN

## 2020-03-28 MED ORDER — BUPIVACAINE IN DEXTROSE 0.75-8.25 % IT SOLN
INTRATHECAL | Status: DC | PRN
  Administered 2020-03-28: 2 mL via INTRATHECAL

## 2020-03-28 MED ORDER — METHOCARBAMOL 500 MG IVPB - SIMPLE MED
500.0000 mg | Freq: Four times a day (QID) | INTRAVENOUS | Status: DC | PRN
  Administered 2020-03-28: 500 mg via INTRAVENOUS
  Filled 2020-03-28: qty 50

## 2020-03-28 MED ORDER — DEXAMETHASONE SODIUM PHOSPHATE 10 MG/ML IJ SOLN
10.0000 mg | Freq: Once | INTRAMUSCULAR | Status: AC
  Administered 2020-03-28: 10 mg via INTRAVENOUS

## 2020-03-28 MED ORDER — METHOCARBAMOL 500 MG PO TABS
500.0000 mg | ORAL_TABLET | Freq: Four times a day (QID) | ORAL | Status: DC | PRN
  Administered 2020-03-28 – 2020-03-29 (×2): 500 mg via ORAL
  Filled 2020-03-28 (×2): qty 1

## 2020-03-28 MED ORDER — ASPIRIN 81 MG PO CHEW
81.0000 mg | CHEWABLE_TABLET | Freq: Two times a day (BID) | ORAL | Status: DC
  Administered 2020-03-28 – 2020-03-29 (×2): 81 mg via ORAL
  Filled 2020-03-28 (×2): qty 1

## 2020-03-28 MED ORDER — EPHEDRINE 5 MG/ML INJ
INTRAVENOUS | Status: AC
  Filled 2020-03-28: qty 10

## 2020-03-28 MED ORDER — MAGNESIUM CITRATE PO SOLN: 1.0000 | Freq: Once | ORAL | Status: DC | PRN

## 2020-03-28 MED ORDER — METOCLOPRAMIDE HCL 5 MG/ML IJ SOLN: 5.0000 mg | Freq: Three times a day (TID) | INTRAMUSCULAR | Status: DC | PRN

## 2020-03-28 MED ORDER — EPHEDRINE SULFATE-NACL 50-0.9 MG/10ML-% IV SOSY
PREFILLED_SYRINGE | INTRAVENOUS | Status: DC | PRN
  Administered 2020-03-28: 5 mg via INTRAVENOUS

## 2020-03-28 MED ORDER — ONDANSETRON HCL 4 MG PO TABS: 4.0000 mg | ORAL_TABLET | Freq: Four times a day (QID) | ORAL | Status: DC | PRN

## 2020-03-28 MED ORDER — LEVOTHYROXINE SODIUM 50 MCG PO TABS
50.0000 ug | ORAL_TABLET | Freq: Every day | ORAL | Status: DC
  Administered 2020-03-29: 50 ug via ORAL
  Filled 2020-03-28: qty 1

## 2020-03-28 MED ORDER — LACTATED RINGERS IV SOLN: INTRAVENOUS | Status: DC

## 2020-03-28 MED ORDER — DEXAMETHASONE SODIUM PHOSPHATE 10 MG/ML IJ SOLN
10.0000 mg | Freq: Once | INTRAMUSCULAR | Status: AC
  Administered 2020-03-29: 10 mg via INTRAVENOUS
  Filled 2020-03-28: qty 1

## 2020-03-28 MED ORDER — CEFAZOLIN SODIUM-DEXTROSE 2-4 GM/100ML-% IV SOLN
2.0000 g | Freq: Four times a day (QID) | INTRAVENOUS | Status: AC
  Administered 2020-03-28 (×2): 2 g via INTRAVENOUS
  Filled 2020-03-28 (×2): qty 100

## 2020-03-28 MED ORDER — PROPOFOL 10 MG/ML IV BOLUS
INTRAVENOUS | Status: DC | PRN
  Administered 2020-03-28 (×2): 20 mg via INTRAVENOUS

## 2020-03-28 MED ORDER — ONDANSETRON HCL 4 MG/2ML IJ SOLN
INTRAMUSCULAR | Status: AC
  Filled 2020-03-28: qty 2

## 2020-03-28 MED ORDER — FENTANYL CITRATE (PF) 100 MCG/2ML IJ SOLN
25.0000 ug | INTRAMUSCULAR | Status: DC | PRN
  Administered 2020-03-28 (×2): 50 ug via INTRAVENOUS

## 2020-03-28 MED ORDER — 0.9 % SODIUM CHLORIDE (POUR BTL) OPTIME
TOPICAL | Status: DC | PRN
  Administered 2020-03-28: 1000 mL

## 2020-03-28 MED ORDER — NITROGLYCERIN 0.4 MG SL SUBL: 0.4000 mg | SUBLINGUAL_TABLET | SUBLINGUAL | Status: DC | PRN

## 2020-03-28 MED ORDER — METHOCARBAMOL 500 MG IVPB - SIMPLE MED
INTRAVENOUS | Status: AC
  Filled 2020-03-28: qty 50

## 2020-03-28 MED ORDER — ALUM & MAG HYDROXIDE-SIMETH 200-200-20 MG/5ML PO SUSP: 15.0000 mL | ORAL | Status: DC | PRN

## 2020-03-28 MED ORDER — HYDROMORPHONE HCL 1 MG/ML IJ SOLN: 0.5000 mg | INTRAMUSCULAR | Status: DC | PRN

## 2020-03-28 MED ORDER — CELECOXIB 200 MG PO CAPS
200.0000 mg | ORAL_CAPSULE | Freq: Two times a day (BID) | ORAL | Status: DC
  Administered 2020-03-28 – 2020-03-29 (×2): 200 mg via ORAL
  Filled 2020-03-28 (×2): qty 1

## 2020-03-28 MED ORDER — LIDOCAINE HCL (PF) 2 % IJ SOLN
INTRAMUSCULAR | Status: AC
  Filled 2020-03-28: qty 5

## 2020-03-28 MED ORDER — CHLORHEXIDINE GLUCONATE 0.12 % MT SOLN
15.0000 mL | Freq: Once | OROMUCOSAL | Status: AC
  Administered 2020-03-28: 15 mL via OROMUCOSAL

## 2020-03-28 MED ORDER — TRANEXAMIC ACID-NACL 1000-0.7 MG/100ML-% IV SOLN
1000.0000 mg | Freq: Once | INTRAVENOUS | Status: AC
  Administered 2020-03-28: 1000 mg via INTRAVENOUS
  Filled 2020-03-28: qty 100

## 2020-03-28 MED ORDER — DEXAMETHASONE SODIUM PHOSPHATE 10 MG/ML IJ SOLN
INTRAMUSCULAR | Status: AC
  Filled 2020-03-28: qty 1

## 2020-03-28 MED ORDER — FENTANYL CITRATE (PF) 100 MCG/2ML IJ SOLN
INTRAMUSCULAR | Status: DC | PRN
  Administered 2020-03-28 (×2): 50 ug via INTRAVENOUS

## 2020-03-28 MED ORDER — CARVEDILOL 3.125 MG PO TABS
3.1250 mg | ORAL_TABLET | Freq: Every day | ORAL | Status: DC
  Administered 2020-03-29: 3.125 mg via ORAL
  Filled 2020-03-28: qty 1

## 2020-03-28 MED ORDER — PROPOFOL 1000 MG/100ML IV EMUL
INTRAVENOUS | Status: AC
  Filled 2020-03-28: qty 100

## 2020-03-28 MED ORDER — PREDNISONE 5 MG PO TABS
10.0000 mg | ORAL_TABLET | Freq: Every day | ORAL | Status: DC
  Filled 2020-03-28: qty 2

## 2020-03-28 MED ORDER — PHENYLEPHRINE 40 MCG/ML (10ML) SYRINGE FOR IV PUSH (FOR BLOOD PRESSURE SUPPORT)
PREFILLED_SYRINGE | INTRAVENOUS | Status: DC | PRN
  Administered 2020-03-28 (×2): 40 ug via INTRAVENOUS

## 2020-03-28 MED ORDER — ONDANSETRON HCL 4 MG/2ML IJ SOLN
INTRAMUSCULAR | Status: DC | PRN
  Administered 2020-03-28: 4 mg via INTRAVENOUS

## 2020-03-28 MED ORDER — SODIUM CHLORIDE 0.9 % IV SOLN: INTRAVENOUS | Status: DC

## 2020-03-28 MED ORDER — HYDROCODONE-ACETAMINOPHEN 5-325 MG PO TABS
1.0000 | ORAL_TABLET | ORAL | Status: DC | PRN
  Administered 2020-03-28: 2 via ORAL
  Filled 2020-03-28: qty 2

## 2020-03-28 MED ORDER — DIPHENHYDRAMINE HCL 12.5 MG/5ML PO ELIX: 12.5000 mg | ORAL_SOLUTION | ORAL | Status: DC | PRN

## 2020-03-28 MED ORDER — MIDAZOLAM HCL 5 MG/5ML IJ SOLN
INTRAMUSCULAR | Status: DC | PRN
  Administered 2020-03-28 (×2): 1 mg via INTRAVENOUS

## 2020-03-28 MED ORDER — STERILE WATER FOR IRRIGATION IR SOLN
Status: DC | PRN
  Administered 2020-03-28: 2000 mL

## 2020-03-28 MED ORDER — HYDROCODONE-ACETAMINOPHEN 7.5-325 MG PO TABS
1.0000 | ORAL_TABLET | ORAL | Status: DC | PRN
  Administered 2020-03-28 – 2020-03-29 (×2): 2 via ORAL
  Filled 2020-03-28 (×3): qty 2

## 2020-03-28 MED ORDER — DOCUSATE SODIUM 100 MG PO CAPS
100.0000 mg | ORAL_CAPSULE | Freq: Two times a day (BID) | ORAL | Status: DC
  Administered 2020-03-28 – 2020-03-29 (×2): 100 mg via ORAL
  Filled 2020-03-28 (×2): qty 1

## 2020-03-28 MED ORDER — TRANEXAMIC ACID-NACL 1000-0.7 MG/100ML-% IV SOLN
1000.0000 mg | INTRAVENOUS | Status: AC
  Administered 2020-03-28: 1000 mg via INTRAVENOUS
  Filled 2020-03-28: qty 100

## 2020-03-28 MED ORDER — BISACODYL 10 MG RE SUPP: 10.0000 mg | Freq: Every day | RECTAL | Status: DC | PRN

## 2020-03-28 MED ORDER — PANTOPRAZOLE SODIUM 40 MG PO TBEC
40.0000 mg | DELAYED_RELEASE_TABLET | Freq: Every day | ORAL | Status: DC
  Administered 2020-03-29: 40 mg via ORAL
  Filled 2020-03-28: qty 1

## 2020-03-28 MED ORDER — FERROUS SULFATE 325 (65 FE) MG PO TABS
325.0000 mg | ORAL_TABLET | Freq: Three times a day (TID) | ORAL | Status: DC
  Administered 2020-03-29: 325 mg via ORAL
  Filled 2020-03-28: qty 1

## 2020-03-28 MED ORDER — MIDAZOLAM HCL 2 MG/2ML IJ SOLN
INTRAMUSCULAR | Status: AC
  Filled 2020-03-28: qty 2

## 2020-03-28 MED ORDER — SACUBITRIL-VALSARTAN 97-103 MG PO TABS
1.0000 | ORAL_TABLET | Freq: Two times a day (BID) | ORAL | Status: DC
  Administered 2020-03-28 – 2020-03-29 (×2): 1 via ORAL
  Filled 2020-03-28 (×2): qty 1

## 2020-03-28 MED ORDER — OXYCODONE HCL 5 MG/5ML PO SOLN
5.0000 mg | Freq: Once | ORAL | Status: DC | PRN
Start: 2020-03-28 — End: 2020-03-28

## 2020-03-28 MED ORDER — PHENOL 1.4 % MT LIQD: 1.0000 | OROMUCOSAL | Status: DC | PRN

## 2020-03-28 MED ORDER — ORAL CARE MOUTH RINSE: 15.0000 mL | Freq: Once | OROMUCOSAL | Status: AC

## 2020-03-28 MED ORDER — PHENYLEPHRINE 40 MCG/ML (10ML) SYRINGE FOR IV PUSH (FOR BLOOD PRESSURE SUPPORT)
PREFILLED_SYRINGE | INTRAVENOUS | Status: AC
  Filled 2020-03-28: qty 10

## 2020-03-28 MED ORDER — LIDOCAINE 2% (20 MG/ML) 5 ML SYRINGE
INTRAMUSCULAR | Status: DC | PRN
  Administered 2020-03-28: 60 mg via INTRAVENOUS

## 2020-03-28 MED ORDER — PHENYLEPHRINE HCL-NACL 10-0.9 MG/250ML-% IV SOLN
INTRAVENOUS | Status: DC | PRN
  Administered 2020-03-28: 30 ug/min via INTRAVENOUS

## 2020-03-28 MED ORDER — CEFAZOLIN SODIUM-DEXTROSE 2-4 GM/100ML-% IV SOLN
2.0000 g | INTRAVENOUS | Status: AC
  Administered 2020-03-28: 2 g via INTRAVENOUS
  Filled 2020-03-28: qty 100

## 2020-03-28 SURGICAL SUPPLY — 45 items
ADH SKN CLS APL DERMABOND .7 (GAUZE/BANDAGES/DRESSINGS) ×1
BAG DECANTER FOR FLEXI CONT (MISCELLANEOUS) IMPLANT
BAG SPEC THK2 15X12 ZIP CLS (MISCELLANEOUS)
BAG ZIPLOCK 12X15 (MISCELLANEOUS) IMPLANT
BLADE SAG 18X100X1.27 (BLADE) ×2 IMPLANT
BLADE SURG SZ10 CARB STEEL (BLADE) ×4 IMPLANT
COVER PERINEAL POST (MISCELLANEOUS) ×2 IMPLANT
COVER SURGICAL LIGHT HANDLE (MISCELLANEOUS) ×2 IMPLANT
COVER WAND RF STERILE (DRAPES) ×1 IMPLANT
CUP ACET PINNACLE SECTR 56MM (Hips) IMPLANT
DERMABOND ADVANCED (GAUZE/BANDAGES/DRESSINGS) ×1
DERMABOND ADVANCED .7 DNX12 (GAUZE/BANDAGES/DRESSINGS) ×1 IMPLANT
DRAPE STERI IOBAN 125X83 (DRAPES) ×2 IMPLANT
DRAPE U-SHAPE 47X51 STRL (DRAPES) ×4 IMPLANT
DRESSING AQUACEL AG SP 3.5X10 (GAUZE/BANDAGES/DRESSINGS) ×1 IMPLANT
DRSG AQUACEL AG SP 3.5X10 (GAUZE/BANDAGES/DRESSINGS) ×2
DURAPREP 26ML APPLICATOR (WOUND CARE) ×2 IMPLANT
ELECT REM PT RETURN 15FT ADLT (MISCELLANEOUS) ×2 IMPLANT
ELIMINATOR HOLE APEX DEPUY (Hips) ×1 IMPLANT
GLOVE ECLIPSE 8.0 STRL XLNG CF (GLOVE) ×4 IMPLANT
GLOVE ORTHO TXT STRL SZ7.5 (GLOVE) ×4 IMPLANT
GLOVE SURG ENC MOIS LTX SZ6 (GLOVE) ×4 IMPLANT
GLOVE SURG UNDER POLY LF SZ6.5 (GLOVE) ×2 IMPLANT
GLOVE SURG UNDER POLY LF SZ7.5 (GLOVE) ×2 IMPLANT
GLOVE SURG UNDER POLY LF SZ8.5 (GLOVE) ×2 IMPLANT
GOWN STRL REUS W/TWL LRG LVL3 (GOWN DISPOSABLE) ×4 IMPLANT
GOWN STRL REUS W/TWL XL LVL3 (GOWN DISPOSABLE) ×2 IMPLANT
HEAD CERAMIC 36 PLUS5 (Hips) ×1 IMPLANT
HOLDER FOLEY CATH W/STRAP (MISCELLANEOUS) ×2 IMPLANT
KIT TURNOVER KIT A (KITS) ×2 IMPLANT
PACK ANTERIOR HIP CUSTOM (KITS) ×2 IMPLANT
PENCIL SMOKE EVACUATOR (MISCELLANEOUS) ×1 IMPLANT
PINNACLE ALTRX PLUS 4 N 36X56 (Hips) ×1 IMPLANT
PINNACLE SECTOR CUP 56MM (Hips) ×2 IMPLANT
SCREW 6.5MMX25MM (Screw) ×1 IMPLANT
STEM FEM ACTIS STD SZ7 (Nail) ×1 IMPLANT
SUT MNCRL AB 4-0 PS2 18 (SUTURE) ×2 IMPLANT
SUT STRATAFIX 0 PDS 27 VIOLET (SUTURE) ×2
SUT VIC AB 1 CT1 36 (SUTURE) ×6 IMPLANT
SUT VIC AB 2-0 CT1 27 (SUTURE) ×4
SUT VIC AB 2-0 CT1 TAPERPNT 27 (SUTURE) ×2 IMPLANT
SUTURE STRATFX 0 PDS 27 VIOLET (SUTURE) ×1 IMPLANT
TRAY FOLEY MTR SLVR 16FR STAT (SET/KITS/TRAYS/PACK) ×1 IMPLANT
TUBE SUCTION HIGH CAP CLEAR NV (SUCTIONS) ×2 IMPLANT
WATER STERILE IRR 1000ML POUR (IV SOLUTION) ×2 IMPLANT

## 2020-03-28 NOTE — Discharge Instructions (Signed)

## 2020-03-28 NOTE — Anesthesia Procedure Notes (Signed)
Spinal  Patient location during procedure: OR Start time: 03/28/2020 10:34 AM End time: 03/28/2020 10:40 AM Staffing Performed: anesthesiologist  Anesthesiologist: Mal Amabile, MD Preanesthetic Checklist Completed: patient identified, IV checked, site marked, risks and benefits discussed, surgical consent, monitors and equipment checked, pre-op evaluation and timeout performed Spinal Block Patient position: sitting Prep: DuraPrep and site prepped and draped Patient monitoring: heart rate, cardiac monitor, continuous pulse ox and blood pressure Approach: midline Location: L3-4 Injection technique: single-shot Needle Needle type: Pencan  Needle gauge: 24 G Needle length: 9 cm Needle insertion depth: 7 cm Assessment Sensory level: T4 Additional Notes Patient tolerated procedure well. Adequate sensory level.

## 2020-03-28 NOTE — Anesthesia Postprocedure Evaluation (Signed)
Anesthesia Post Note  Patient: Brendan [name]  Procedure(s) Performed: TOTAL HIP ARTHROPLASTY ANTERIOR APPROACH (Left Hip)     Patient location during evaluation: PACU Anesthesia Type: Spinal Level of consciousness: oriented and awake and alert Pain management: pain level controlled Vital Signs Assessment: post-procedure vital signs reviewed and stable Respiratory status: spontaneous breathing, respiratory function stable and nonlabored ventilation Cardiovascular status: blood pressure returned to baseline and stable Postop Assessment: no headache, no backache, no apparent nausea or vomiting, spinal receding and patient able to bend at knees Anesthetic complications: no   No complications documented.  Last Vitals:  Vitals:   03/28/20 1300 03/28/20 1315  BP: 107/84 100/62  Pulse: (!) 53 (!) 57  Resp: 14 13  Temp:    SpO2: 99% 100%    Last Pain:  Vitals:   03/28/20 1315  TempSrc:   PainSc: 4                  Taelynn Mcelhannon A.

## 2020-03-28 NOTE — Anesthesia Preprocedure Evaluation (Addendum)
Anesthesia Evaluation  Patient identified by MRN, date of birth, ID band Patient awake    Reviewed: Allergy & Precautions, NPO status , Patient's Chart, lab work & pertinent test results, reviewed documented beta blocker date and time   Airway Mallampati: II  TM Distance: >3 FB Neck ROM: Full    Dental  (+) Missing, Poor Dentition   Pulmonary former smoker,    breath sounds clear to auscultation       Cardiovascular + CAD, + Past MI and +CHF  Normal cardiovascular exam Rhythm:Regular Rate:Normal  Non ischemic CM  EKG 11/22/19 NSR, RBBB + LAFB, LAD, LVH, possible anterior infarct  Echo 01/15/19 1. Left ventricular ejection fraction, by visual estimation, is 40 to 45%. The left ventricle has low normal function. There is mildly increased left ventricular hypertrophy.  2. Left ventricular diastolic parameters are consistent with Grade I diastolic dysfunction (impaired relaxation).  3. The left ventricle has no regional wall motion abnormalities.  4. Global right ventricle has normal systolic function.The right ventricular size is normal. No increase in right ventricular wall thickness.  5. Left atrial size was normal.  6. Right atrial size was normal.  7. The pericardial effusion is circumferential.  8. Trivial pericardial effusion is present.  9. The mitral valve is normal in structure. No evidence of mitral valve regurgitation.  10. The tricuspid valve is normal in structure. Tricuspid valve regurgitation is mild.  11. The aortic valve is normal in structure. Aortic valve regurgitation is not visualized.  12. The pulmonic valve was not well visualized. Pulmonic valve regurgitation is not visualized.  13. Mildly elevated pulmonary artery systolic pressure.  14. The inferior vena cava is normal in size with greater than 50% respiratory variability, suggesting right atrial pressure of 3 mmHg.   Cardiac Cath 01/15/18 1. No  angiographically significant coronary artery disease. 2. Severely reduced left ventricular systolic function consistent with non-ischemic cardiomyopathy (LVEF ~25-30%). 3. Moderately elevated left ventricular filling pressure (LVEDP 25-30 mmHg).  Recommendations: 1. Admit for optimization of heart failure. 2. Initiate furosemide 20 mg IV daily and carvedilol 3.125 mg twice daily. 3. Consider ACEI/ARB tomorrow, as renal function and blood pressure allow. 4. Consider cardiac MRI.     Neuro/Psych negative neurological ROS  negative psych ROS   GI/Hepatic Neg liver ROS, GERD  Medicated and Controlled,Esophageal stricture Dysphagia   Endo/Other  Hypothyroidism Hyperlipidemia Obesity Hyperglycemia  Renal/GU negative Renal ROS  negative genitourinary   Musculoskeletal  (+) Arthritis , Osteoarthritis,  AVN left hip   Abdominal (+) + obese,   Peds  Hematology Hx/o Thrombocytopenia   Anesthesia Other Findings   Reproductive/Obstetrics                            Anesthesia Physical Anesthesia Plan  ASA: III  Anesthesia Plan: Spinal   Post-op Pain Management:    Induction: Intravenous  PONV Risk Score and Plan: 2 and Propofol infusion, Treatment may vary due to age or medical condition, Ondansetron and Midazolam  Airway Management Planned: Natural Airway and Simple Face Mask  Additional Equipment:   Intra-op Plan:   Post-operative Plan:   Informed Consent: I have reviewed the patients History and Physical, chart, labs and discussed the procedure including the risks, benefits and alternatives for the proposed anesthesia with the patient or authorized representative who has indicated his/her understanding and acceptance.     Dental advisory given  Plan Discussed with: CRNA and Anesthesiologist  Anesthesia Plan  Comments:         Anesthesia Quick Evaluation

## 2020-03-28 NOTE — Op Note (Signed)
NAME:  Brendan [name]                ACCOUNT NO.: [id number]      MEDICAL RECORD NO.: [id number]      FACILITY:  [facility]      PHYSICIAN:  [name]  DATE OF BIRTH:  1955-03-25     DATE OF PROCEDURE:  03/28/2020                                 OPERATIVE REPORT         PREOPERATIVE DIAGNOSIS: Left  hip osteoarthritis.      POSTOPERATIVE DIAGNOSIS:  Left hip osteoarthritis.      PROCEDURE:  Left total hip replacement through an anterior approach   utilizing DePuy THR system, component size 56 mm pinnacle cup, a size 36+4 neutral   Altrex liner, a size 7 standard Actis stem with a 36+5 delta ceramic   ball.      SURGEON:  Madlyn Frankel. Charlann Boxer, M.D.      ASSISTANT:  Lanney Gins, PA-C     ANESTHESIA:  Spinal.      SPECIMENS:  None.      COMPLICATIONS:  None.      BLOOD LOSS:  350 cc     DRAINS:  None.      INDICATION OF THE PROCEDURE:  Brendan [name] is a 65 y.o. male who had   presented to office for evaluation of left hip pain.  Radiographs revealed   progressive degenerative changes with bone-on-bone   articulation of the  hip joint, including subchondral cystic changes and osteophytes.  The patient had painful limited range of   motion significantly affecting their overall quality of life and function.  The patient was failing to    respond to conservative measures including medications and/or injections and activity modification and at this point was ready   to proceed with more definitive measures.  Consent was obtained for   benefit of pain relief.  Specific risks of infection, DVT, component   failure, dislocation, neurovascular injury, and need for revision surgery were reviewed in the office as well discussion of   the anterior versus posterior approach were reviewed.     PROCEDURE IN DETAIL:  The patient was brought to operative theater.   Once adequate anesthesia, preoperative antibiotics, 2 gm of Ancef, 1 gm of Tranexamic Acid, and  10 mg of Decadron were administered, the patient was positioned supine on the [employer] table.  Once the patient was safely positioned with adequate padding of boney prominences we predraped out the hip, and used fluoroscopy to confirm orientation of the pelvis.      The left hip was then prepped and draped from proximal iliac crest to   mid thigh with a shower curtain technique.      Time-out was performed identifying the patient, planned procedure, and the appropriate extremity.     An incision was then made 2 cm lateral to the   anterior superior iliac spine extending over the orientation of the   tensor fascia lata muscle and sharp dissection was carried down to the   fascia of the muscle.      The fascia was then incised.  The muscle belly was identified and swept   laterally and retractor placed along the superior neck.  Following   cauterization of the circumflex vessels and removing some  pericapsular   fat, a second cobra retractor was placed on the inferior neck.  A T-capsulotomy was made along the line of the   superior neck to the trochanteric fossa, then extended proximally and   distally.  Tag sutures were placed and the retractors were then placed   intracapsular.  We then identified the trochanteric fossa and   orientation of my neck cut and then made a neck osteotomy with the femur on traction.  The femoral   head was removed without difficulty or complication.  Traction was let   off and retractors were placed posterior and anterior around the   acetabulum.      The labrum and foveal tissue were debrided.  I began reaming with a 46 mm   reamer and reamed up to 55 mm reamer with good bony bed preparation and a 56 mm  cup was chosen.  The final 56 mm Pinnacle cup was then impacted under fluoroscopy to confirm the depth of penetration and orientation with respect to   Abduction and forward flexion.  A screw was placed into the ilium followed by the hole eliminator.  The final    36+4 neutral Altrex liner was impacted with good visualized rim fit.  The cup was positioned anatomically within the acetabular portion of the pelvis.      At this point, the femur was rolled to 100 degrees.  Further capsule was   released off the inferior aspect of the femoral neck.  I then   released the superior capsule proximally.  With the leg in a neutral position the hook was placed laterally   along the femur under the vastus lateralis origin and elevated manually and then held in position using the hook attachment on the bed.  The leg was then extended and adducted with the leg rolled to 100   degrees of external rotation.  Retractors were placed along the medial calcar and posteriorly over the greater trochanter.  Once the proximal femur was fully   exposed, I used a box osteotome to set orientation.  I then began   broaching with the starting chili pepper broach and passed this by hand and then broached up to 7.  With the 7 broach in place I chose a standard neck and did several trial reductions.  The offset was appropriate, leg lengths   appeared to be equal best matched with the +5 head ball trial confirmed radiographically.   Given these findings, I went ahead and dislocated the hip, repositioned all   retractors and positioned the right hip in the extended and abducted position.  The final 7 standard Actis stem was   chosen and it was impacted down to the level of neck cut.  Based on this   and the trial reductions, a final 36+5 delta ceramic ball was chosen and   impacted onto a clean and dry trunnion, and the hip was reduced.  The   hip had been irrigated throughout the case again at this point.  I did   reapproximate the superior capsular leaflet to the anterior leaflet   using #1 Vicryl.  The fascia of the   tensor fascia lata muscle was then reapproximated using #1 Vicryl and #0 Stratafix sutures.  The   remaining wound was closed with 2-0 Vicryl and running 4-0 Monocryl.    The hip was cleaned, dried, and dressed sterilely using Dermabond and   Aquacel dressing.  The patient was then brought   to recovery  room in stable condition tolerating the procedure well.    Lanney Gins, PA-C was present for the entirety of the case involved from   preoperative positioning, perioperative retractor management, general   facilitation of the case, as well as primary wound closure as assistant.            Madlyn Frankel Charlann Boxer, M.D.        03/28/2020 11:59 AM

## 2020-03-28 NOTE — Interval H&P Note (Signed)
History and Physical Interval Note:  03/28/2020 9:30 AM  Brendan [name]  has presented today for surgery, with the diagnosis of Left hip avascular necrosis.  The various methods of treatment have been discussed with the patient and family. After consideration of risks, benefits and other options for treatment, the patient has consented to  Procedure(s) with comments: TOTAL HIP ARTHROPLASTY ANTERIOR APPROACH (Left) - 70 mins as a surgical intervention.  The patient's history has been reviewed, patient examined, no change in status, stable for surgery.  I have reviewed the patient's chart and labs.  Questions were answered to the patient's satisfaction.     [name]

## 2020-03-28 NOTE — Plan of Care (Signed)
Discussed with patient about plan of care for post-op day 0. ° ° °Will continue to monitor patient.  ° ° °SWhittemore, RN ° °

## 2020-03-28 NOTE — Anesthesia Procedure Notes (Signed)
Procedure Name: MAC Date/Time: 03/28/2020 11:05 AM Performed by: Claudia Desanctis, CRNA Pre-anesthesia Checklist: Patient identified, Emergency Drugs available, Suction available, Patient being monitored and Timeout performed Oxygen Delivery Method: Simple face mask

## 2020-03-28 NOTE — Evaluation (Signed)
Physical Therapy Evaluation Patient Details Name: Brendan [name] MRN: [id number] DOB: 05/11/1955 Today's Date: 03/28/2020   History of Present Illness  Patient is 65 y.o. male s/p Lt THA anterior approach on 03/28/20 with PMH significant for RA, OA, MI, GERD, CAD, CHF, hypothyroidism.    Clinical Impression  Brendan [name] is a 65 y.o. male POD 0 s/p Lt THA. Patient reports independence with mobility at baseline. Patient is now limited by functional impairments (see PT problem list below) and requires min guard for transfers and gait with RW. Patient was able to ambulate ~120 feet with RW and min guard/assist. Patient instructed in exercise to facilitate circulation. Patient will benefit from continued skilled PT interventions to address impairments and progress towards PLOF. Acute PT will follow to progress mobility and stair training in preparation for safe discharge home.     Follow Up Recommendations Follow surgeon's recommendation for DC plan and follow-up therapies    Equipment Recommendations  None recommended by PT    Recommendations for Other Services       Precautions / Restrictions Precautions Precautions: Fall (Simultaneous filing. User may not have seen previous data.) Restrictions Weight Bearing Restrictions: (P) No Other Position/Activity Restrictions: (P) WBAT      Mobility  Bed Mobility Overal bed mobility: Needs Assistance Bed Mobility: Supine to Sit     Supine to sit: Supervision;HOB elevated     General bed mobility comments: Pt using bed rail, no assist needed, some incresaed time to bring Lt LE off EOB.    Transfers Overall transfer level: Needs assistance Equipment used: Rolling walker (2 wheeled) Transfers: Sit to/from Stand Sit to Stand: Min guard;From elevated surface         General transfer comment: EOB slightly elevated, cues for hand placement with RW, guarding for safety.  Ambulation/Gait Ambulation/Gait assistance: Min guard Gait  Distance (Feet): 120 Feet Assistive device: Rolling walker (2 wheeled) Gait Pattern/deviations: Step-to pattern;Decreased stride length;Step-through pattern Gait velocity: fair   General Gait Details: cues for step pattern and position for RW. no overt LOB and pt progressed to step to pattern with RW.  Stairs            Wheelchair Mobility    Modified Rankin (Stroke Patients Only)       Balance Overall balance assessment: Mild deficits observed, not formally tested                                           Pertinent Vitals/Pain Pain Assessment: Faces Faces Pain Scale: Hurts a little bit Pain Location: Lt hip Pain Descriptors / Indicators: Aching;Discomfort Pain Intervention(s): Limited activity within patient's tolerance;Monitored during session;Repositioned;Ice applied    Home Living Family/patient expects to be discharged to:: Private residence Living Arrangements: Alone Available Help at Discharge: Family Type of Home: House Home Access: Level entry     Home Layout: One level Home Equipment: Environmental consultant - 2 wheels;Cane - single point;Bedside commode      Prior Function Level of Independence: Independent               Hand Dominance   Dominant Hand: Right    Extremity/Trunk Assessment   Upper Extremity Assessment Upper Extremity Assessment: Overall WFL for tasks assessed    Lower Extremity Assessment Lower Extremity Assessment: Overall WFL for tasks assessed    Cervical / Trunk Assessment Cervical / Trunk Assessment: Normal  Communication  Communication: No difficulties  Cognition Arousal/Alertness: Awake/alert Behavior During Therapy: WFL for tasks assessed/performed Overall Cognitive Status: Within Functional Limits for tasks assessed                                        General Comments      Exercises Total Joint Exercises Ankle Circles/Pumps: AROM;Both;20 reps;Seated   Assessment/Plan    PT  Assessment Patient needs continued PT services  PT Problem List Decreased strength;Decreased range of motion;Decreased activity tolerance;Decreased balance;Decreased mobility;Decreased knowledge of use of DME;Decreased knowledge of precautions       PT Treatment Interventions DME instruction;Gait training;Stair training;Functional mobility training;Therapeutic activities;Therapeutic exercise;Balance training;Patient/family education    PT Goals (Current goals can be found in the Care Plan section)  Acute Rehab PT Goals Patient Stated Goal: recover independence PT Goal Formulation: With patient Time For Goal Achievement: 04/04/20 Potential to Achieve Goals: Good    Frequency 7X/week   Barriers to discharge        Co-evaluation               AM-PAC PT "6 Clicks" Mobility  Outcome Measure Help needed turning from your back to your side while in a flat bed without using bedrails?: None Help needed moving from lying on your back to sitting on the side of a flat bed without using bedrails?: None Help needed moving to and from a bed to a chair (including a wheelchair)?: A Little Help needed standing up from a chair using your arms (e.g., wheelchair or bedside chair)?: A Little Help needed to walk in hospital room?: A Little Help needed climbing 3-5 steps with a railing? : A Little 6 Click Score: 20    End of Session Equipment Utilized During Treatment: Gait belt Activity Tolerance: Patient tolerated treatment well Patient left: in chair;with call bell/phone within reach;with chair alarm set Nurse Communication: Mobility status PT Visit Diagnosis: Muscle weakness (generalized) (M62.81);Difficulty in walking, not elsewhere classified (R26.2)    Time: 0350-0938 PT Time Calculation (min) (ACUTE ONLY): 15 min   Charges:   PT Evaluation $PT Eval Low Complexity: 1 Low          Wynn Maudlin, DPT Acute Rehabilitation Services Office [phone] Pager [phone]     Anitra Lauth 03/28/2020, 6:56 PM

## 2020-03-28 NOTE — Transfer of Care (Signed)
Immediate Anesthesia Transfer of Care Note  Patient: Brendan [name]  Procedure(s) Performed: TOTAL HIP ARTHROPLASTY ANTERIOR APPROACH (Left Hip)  Patient Location: PACU  Anesthesia Type:Spinal  Level of Consciousness: drowsy  Airway & Oxygen Therapy: Patient Spontanous Breathing and Patient connected to face mask  Post-op Assessment: Report given to RN and Post -op Vital signs reviewed and stable  Post vital signs: Reviewed and stable  Last Vitals:  Vitals Value Taken Time  BP 104/64 03/28/20 1218  Temp    Pulse 72 03/28/20 1221  Resp 18 03/28/20 1221  SpO2 100 % 03/28/20 1221  Vitals shown include unvalidated device data.  Last Pain:  Vitals:   03/28/20 0915  TempSrc:   PainSc: 5       Patients Stated Pain Goal: 4 (03/28/20 0915)  Complications: No complications documented.

## 2020-03-29 DIAGNOSIS — E669 Obesity, unspecified: Secondary | ICD-10-CM | POA: Diagnosis present

## 2020-03-29 DIAGNOSIS — I2510 Atherosclerotic heart disease of native coronary artery without angina pectoris: Secondary | ICD-10-CM | POA: Diagnosis not present

## 2020-03-29 DIAGNOSIS — Z7982 Long term (current) use of aspirin: Secondary | ICD-10-CM | POA: Diagnosis not present

## 2020-03-29 DIAGNOSIS — E039 Hypothyroidism, unspecified: Secondary | ICD-10-CM | POA: Diagnosis not present

## 2020-03-29 DIAGNOSIS — Z87891 Personal history of nicotine dependence: Secondary | ICD-10-CM | POA: Diagnosis not present

## 2020-03-29 DIAGNOSIS — M1612 Unilateral primary osteoarthritis, left hip: Secondary | ICD-10-CM | POA: Diagnosis not present

## 2020-03-29 DIAGNOSIS — M25552 Pain in left hip: Secondary | ICD-10-CM | POA: Diagnosis not present

## 2020-03-29 LAB — BASIC METABOLIC PANEL
Anion gap: 10 (ref 5–15)
BUN: 15 mg/dL (ref 8–23)
CO2: 22 mmol/L (ref 22–32)
Calcium: 8.3 mg/dL — ABNORMAL LOW (ref 8.9–10.3)
Chloride: 101 mmol/L (ref 98–111)
Creatinine, Ser: 0.93 mg/dL (ref 0.61–1.24)
GFR, Estimated: >60 mL/min (ref >60)
Glucose, Bld: 183 mg/dL — ABNORMAL HIGH (ref 70–99)
Potassium: 4.3 mmol/L (ref 3.5–5.1)
Sodium: 133 mmol/L — ABNORMAL LOW (ref 135–145)

## 2020-03-29 LAB — CBC
HCT: 36.5 % — ABNORMAL LOW (ref 39.0–52.0)
Hemoglobin: 12.7 g/dL — ABNORMAL LOW (ref 13.0–17.0)
MCH: 32.5 pg (ref 26.0–34.0)
MCHC: 34.8 g/dL (ref 30.0–36.0)
MCV: 93.4 fL (ref 80.0–100.0)
Platelets: 134 10*3/uL — ABNORMAL LOW (ref 150–400)
RBC: 3.91 MIL/uL — ABNORMAL LOW (ref 4.22–5.81)
RDW: 12.4 % (ref 11.5–15.5)
WBC: 13.8 10*3/uL — ABNORMAL HIGH (ref 4.0–10.5)
nRBC: 0 % (ref 0.0–0.2)

## 2020-03-29 MED ORDER — POLYETHYLENE GLYCOL 3350 17 G PO PACK: 17.0000 g | PACK | Freq: Two times a day (BID) | ORAL | 0 refills | Status: DC

## 2020-03-29 MED ORDER — METHOCARBAMOL 500 MG PO TABS: 500.0000 mg | ORAL_TABLET | Freq: Four times a day (QID) | ORAL | 0 refills | Status: DC | PRN

## 2020-03-29 MED ORDER — ASPIRIN 81 MG PO CHEW: 81.0000 mg | CHEWABLE_TABLET | Freq: Two times a day (BID) | ORAL | 0 refills | Status: AC

## 2020-03-29 MED ORDER — DOCUSATE SODIUM 100 MG PO CAPS: 100.0000 mg | ORAL_CAPSULE | Freq: Two times a day (BID) | ORAL | 0 refills | Status: DC

## 2020-03-29 MED ORDER — HYDROCODONE-ACETAMINOPHEN 7.5-325 MG PO TABS: 1.0000 | ORAL_TABLET | ORAL | 0 refills | Status: DC | PRN

## 2020-03-29 MED ORDER — FERROUS SULFATE 325 (65 FE) MG PO TABS: 325.0000 mg | ORAL_TABLET | Freq: Three times a day (TID) | ORAL | 0 refills | Status: DC

## 2020-03-29 NOTE — Progress Notes (Signed)
     Subjective: 1 Day Post-Op Procedure(s) (LRB): TOTAL HIP ARTHROPLASTY ANTERIOR APPROACH (Left)   Seen by Dr. Charlann Boxer. Patient reports pain as mild, pain controlled.  No reported events throughout the night.  Dr. Charlann Boxer discussed the procedure, findings and expectations moving forward.  Ready to be discharged home, if they do well with PT.  Follow up in the clinic in 2 weeks.  Knows to call with any questions or concerns.       Objective:   VITALS:   Vitals:   03/29/20 0115 03/29/20 0546  BP: 105/63 115/60  Pulse: 68 64  Resp: 18 16  Temp: 97.7 F (36.5 C) 97.7 F (36.5 C)  SpO2: 98% 96%    Dorsiflexion/Plantar flexion intact Incision: dressing C/D/I No cellulitis present Compartment soft  LABS Recent Labs    03/29/20 0329  HGB 12.7*  HCT 36.5*  WBC 13.8*  PLT 134*    Recent Labs    03/29/20 0329  NA 133*  K 4.3  BUN 15  CREATININE 0.93  GLUCOSE 183*     Assessment/Plan: 1 Day Post-Op Procedure(s) (LRB): TOTAL HIP ARTHROPLASTY ANTERIOR APPROACH (Left) Foley cath d/c'ed Advance diet Up with therapy D/C IV fluids Discharge home Follow up in 2 weeks at [facility] Follow up with OLIN,Yarieliz Wasser D in 2 weeks.  Contact information:  [facility] [address], [address] [place] [address] [phone]    Obese (BMI 30-39.9) Estimated body mass index is 30.38 kg/m as calculated from the following:   Height as of this encounter: 6' (1.829 m).   Weight as of this encounter: 101.6 kg. Patient also counseled that weight may inhibit the healing process Patient counseled that losing weight will help with future health issues        Lanney Gins PA-C  [facility]  [place] [address], [address], [place], [place] [address] Phone: [phone] [email or web address] Facebook  [employer]

## 2020-03-29 NOTE — TOC Transition Note (Addendum)
Transition of Care [facility]) - CM/SW Discharge Note   Patient Details  Name: OZIE LUPE MRN: [id number] Date of Birth: 1955/11/23  Transition of [facility]) CM/SW Contact:  Clearance Coots, LCSW Phone Number: 03/29/2020, 10:12 AM   Clinical Narrative:    Prearranged Therapy Plan: HEP Patient plans to borrow his RW and 3 in1 from a friend.  Patient confirm the RW will be accessible upon d/c today   Final next level of care: Home/Self Care (HEP) Barriers to Discharge: No Barriers Identified   Patient Goals and CMS Choice        Discharge Placement                       Discharge Plan and Services                                    Social Determinants of Health (SDOH) Interventions     Readmission Risk Interventions No flowsheet data found.

## 2020-03-29 NOTE — Plan of Care (Signed)
Poc discussed with pt 

## 2020-03-29 NOTE — Discharge Summary (Signed)
Patient ID: Brendan [name] MRN: [id number] DOB/AGE: [birth year] 63 y.o.  Admit date: 03/28/2020 Discharge date: 03/29/2020  Admission Diagnoses:  Principal Problem:   Osteoarthritis of left hip Active Problems:   Status post left hip replacement   Obese   Discharge Diagnoses:  Same  Past Medical History:  Diagnosis Date  . CHF (congestive heart failure) (HCC)   . Coronary artery disease   . GERD (gastroesophageal reflux disease)   . Hypothyroidism   . Myocardial infarction (HCC)   . Osteoarthritis   . Rheumatoid arthritis (HCC)   . Rheumatoid arthritis (HCC)     Surgeries: Procedure(s): LEFT TOTAL HIP ARTHROPLASTY ANTERIOR APPROACH on 03/28/2020   Consultants: N/A  Discharged Condition: Improved  Hospital Course: Brendan [name] is an 65 y.o. male who was admitted 03/28/2020 for operative treatment of Osteoarthritis of left hip. Patient has severe unremitting pain that affects sleep, daily activities, and work/hobbies. After pre-op clearance the patient was taken to the operating room on 03/28/2020 and underwent  Procedure(s): LEFT TOTAL HIP ARTHROPLASTY ANTERIOR APPROACH.    Patient was given perioperative antibiotics:  Anti-infectives (From admission, onward)   Start     Dose/Rate Route Frequency Ordered Stop   03/28/20 1700  ceFAZolin (ANCEF) IVPB 2g/100 mL premix        2 g 200 mL/hr over 30 Minutes Intravenous Every 6 hours 03/28/20 1424 03/28/20 2307   03/28/20 0915  ceFAZolin (ANCEF) IVPB 2g/100 mL premix        2 g 200 mL/hr over 30 Minutes Intravenous On call to O.R. 03/28/20 0901 03/28/20 1038       Patient was given sequential compression devices, early ambulation, and chemoprophylaxis to prevent DVT.  Patient benefited maximally from hospital stay and there were no complications.    Recent vital signs:  Patient Vitals for the past 24 hrs:  BP Temp Temp src Pulse Resp SpO2 Height  03/29/20 0546 115/60 97.7 F (36.5 C) Oral 64 16 96 % -  03/29/20 0115  105/63 97.7 F (36.5 C) Oral 68 18 98 % -  03/28/20 2129 121/62 98.4 F (36.9 C) Oral 73 16 97 % -  03/28/20 1729 118/67 97.8 F (36.6 C) Oral 62 16 99 % -  03/28/20 1626 132/62 97.7 F (36.5 C) Oral 75 16 98 % -  03/28/20 1527 116/72 (!) 97.4 F (36.3 C) Oral 75 17 100 % -  03/28/20 1426 116/73 (!) 97.4 F (36.3 C) Oral (!) 58 16 99 % -  03/28/20 1400 121/83 97.6 F (36.4 C) - (!) 57 12 99 % -  03/28/20 1345 113/83 - - (!) 54 10 97 % -  03/28/20 1330 (!) 108/59 97.7 F (36.5 C) - (!) 56 11 100 % -  03/28/20 1315 100/62 - - (!) 57 13 100 % -  03/28/20 1300 107/84 - - (!) 53 14 99 % -  03/28/20 1230 91/63 - - 61 12 100 % -  03/28/20 1219 104/64 97.7 F (36.5 C) - 64 17 97 % -  03/28/20 0915 - - - - - - 6' (1.829 m)  03/28/20 0858 138/75 97.8 F (36.6 C) Oral 70 18 100 % -     Recent laboratory studies:  Recent Labs    03/29/20 0329  WBC 13.8*  HGB 12.7*  HCT 36.5*  PLT 134*  NA 133*  K 4.3  CL 101  CO2 22  BUN 15  CREATININE 0.93  GLUCOSE 183*  CALCIUM 8.3*  Discharge Medications:   Allergies as of 03/29/2020      Reactions   Simponi [golimumab]    Itching, rash      Medication List    STOP taking these medications   aspirin EC 81 MG tablet Replaced by: aspirin 81 MG chewable tablet   HYDROcodone-acetaminophen 5-325 MG tablet Commonly known as: NORCO/VICODIN Replaced by: HYDROcodone-acetaminophen 7.5-325 MG tablet   sarilumab 200 MG/1. Sosy Commonly known as: KEVZARA     TAKE these medications   aspirin 81 MG chewable tablet Commonly known as: Aspirin Childrens Chew 1 tablet (81 mg total) by mouth 2 (two) times daily. Take for 4 weeks, then resume regular dose. Start taking on: March 30, 2020 Replaces: aspirin EC 81 MG tablet   atorvastatin 10 MG tablet Commonly known as: LIPITOR Take 10 mg by mouth daily.   azelastine 0.1 % nasal spray Commonly known as: ASTELIN Place 1 spray into both nostrils 2 (two) times daily as needed for  allergies.   carvedilol 3.125 MG tablet Commonly known as: COREG TAKE 1 TABLET BY MOUTH EVERY MORNING AND TAKE 2 TABLETS EVERY EVENING What changed:   how much to take  how to take this  when to take this   clobetasol 0.05 % external solution Commonly known as: TEMOVATE Apply 1 application topically 2 (two) times daily as needed (to affected areas of scalp).   CORTIZONE-10 EX Apply 1 application topically 2 (two) times daily as needed (itching).   docusate sodium 100 MG capsule Commonly known as: Colace Take 1 capsule (100 mg total) by mouth 2 (two) times daily.   Entresto 97-103 MG Generic drug: sacubitril-valsartan TAKE 1 TABLET BY MOUTH TWICE A DAY   ferrous sulfate 325 (65 FE) MG tablet Commonly known as: FerrouSul Take 1 tablet (325 mg total) by mouth 3 (three) times daily with meals for 14 days.   HYDROcodone-acetaminophen 7.5-325 MG tablet Commonly known as: Norco Take 1-2 tablets by mouth every 4 (four) hours as needed for moderate pain. Replaces: HYDROcodone-acetaminophen 5-325 MG tablet   levothyroxine 50 MCG tablet Commonly known as: SYNTHROID Take 50 mcg by mouth daily before breakfast.   MegaRed Omega-3 Krill Oil 500 MG Caps Take 1 tablet by mouth daily.   methocarbamol 500 MG tablet Commonly known as: Robaxin Take 1 tablet (500 mg total) by mouth every 6 (six) hours as needed for muscle spasms.   metroNIDAZOLE 0.75 % gel Commonly known as: METROGEL Apply 1 application topically 2 (two) times daily as needed (Rosacea).   nitroGLYCERIN 0.4 MG SL tablet Commonly known as: NITROSTAT Place 1 tablet (0.4 mg total) under the tongue every 5 (five) minutes x 3 doses as needed for chest pain.   pantoprazole 40 MG tablet Commonly known as: PROTONIX Take 1 tablet (40 mg total) by mouth daily.   polyethylene glycol 17 g packet Commonly known as: MIRALAX / GLYCOLAX Take 17 g by mouth 2 (two) times daily.   predniSONE 10 MG tablet Commonly known as:  DELTASONE Take by mouth.   Q-10 CO-ENZYME PO Take 1 tablet by mouth daily.   Sarna lotion Generic drug: camphor-menthol Apply 1 application topically daily as needed for itching.   spironolactone 25 MG tablet Commonly known as: ALDACTONE TAKE 1/2 TABLET BY MOUTH EVERY DAY   triamcinolone 0.1 % Commonly known as: KENALOG Apply 1 application topically 2 (two) times daily as needed (skin irritation.).   VITAMIN B 12 PO Take 1 tablet by mouth daily.   VITAMIN D PO  Take 1 tablet by mouth daily.            Discharge Care Instructions  (From admission, onward)         Start     Ordered   03/29/20 0000  Change dressing       Comments: Maintain surgical dressing until follow up in the clinic. If the edges start to pull up, may reinforce with tape. If the dressing is no longer working, may remove and cover with gauze and tape, but must keep the area dry and clean.  Call with any questions or concerns.   03/29/20 0834          Diagnostic Studies: DG Pelvis Portable  Result Date: 03/28/2020 CLINICAL DATA:  Status post left hip replacement. EXAM: PORTABLE PELVIS 1-2 VIEWS COMPARISON:  None. FINDINGS: Left acetabular and femoral components appear to be well situated. Expected postoperative changes are noted in the surrounding soft tissues. IMPRESSION: Status post left total hip arthroplasty. Electronically Signed   By: Lupita Raider M.D.   On: 03/28/2020 13:25   DG C-Arm 1-60 Min-No Report  Result Date: 03/28/2020 Fluoroscopy was utilized by the requesting physician.  No radiographic interpretation.   DG HIP OPERATIVE UNILAT W OR W/O PELVIS LEFT  Result Date: 03/28/2020 CLINICAL DATA:  Left hip replacement EXAM: OPERATIVE left HIP (WITH PELVIS IF PERFORMED) 4 VIEWS TECHNIQUE: Fluoroscopic spot image(s) were submitted for interpretation post-operatively. COMPARISON:  11/03/2019 FINDINGS: Left hip replacement in satisfactory position and alignment. No fracture or complication.  IMPRESSION: Satisfactory left hip replacement. Electronically Signed   By: Marlan Palau M.D.   On: 03/28/2020 13:46    Disposition: Home  Discharge Instructions    Call MD / Call 911   Complete by: As directed    If you experience chest pain or shortness of breath, CALL 911 and be transported to the hospital emergency room.  If you develope a fever above 101 F, pus (white drainage) or increased drainage or redness at the wound, or calf pain, call your surgeon's office.   Change dressing   Complete by: As directed    Maintain surgical dressing until follow up in the clinic. If the edges start to pull up, may reinforce with tape. If the dressing is no longer working, may remove and cover with gauze and tape, but must keep the area dry and clean.  Call with any questions or concerns.   Constipation Prevention   Complete by: As directed    Drink plenty of fluids.  Prune juice may be helpful.  You may use a stool softener, such as Colace (over the counter) 100 mg twice a day.  Use MiraLax (over the counter) for constipation as needed.   Diet - low sodium heart healthy   Complete by: As directed    Discharge instructions   Complete by: As directed    Maintain surgical dressing until follow up in the clinic. If the edges start to pull up, may reinforce with tape. If the dressing is no longer working, may remove and cover with gauze and tape, but must keep the area dry and clean.  Follow up in 2 weeks at [facility]. Call with any questions or concerns.   Increase activity slowly as tolerated   Complete by: As directed    Weight bearing as tolerated with assist device (walker, cane, etc) as directed, use it as long as suggested by your surgeon or therapist, typically at least 4-6 weeks.   TED hose  Complete by: As directed    Use stockings (TED hose) for 2 weeks on both leg(s).  You may remove them at night for sleeping.       Follow-up Information    Durene Romans, MD. Schedule an  appointment as soon as possible for a visit in 2 weeks.   Specialty: Orthopedic Surgery Contact information: [address] [place] [address] [address] [phone]                Signed: Genelle Gather [facility] 03/29/2020, 8:36 AM

## 2020-03-29 NOTE — Progress Notes (Signed)
Physical Therapy Treatment Patient Details Name: Brendan [name] MRN: [id number] DOB: [birth year] Today's Date: 03/29/2020    History of Present Illness Patient is 65 y.o. male s/p Lt THA anterior approach on 03/28/20 with PMH significant for RA, OA, MI, GERD, CAD, CHF, hypothyroidism.    PT Comments    Pt has made good progress with mobility. Pt is currently mod indep with basic mobility with RW. Pt able to ambulate on the unit 250 ft with mod indep. Completed HEP with independence with handout. Pt reports he does not have steps to enter his home therefore we verbally discussed technique for negotiating curbs.Pt is ready for d/c home today. Pt will continue HEP at home and agreeable to frequent walks. Pt is d/c from acute PT services.  Follow Up Recommendations  Follow surgeon's recommendation for DC plan and follow-up therapies     Equipment Recommendations  None recommended by PT    Recommendations for Other Services       Precautions / Restrictions Precautions Precautions: Fall Restrictions Weight Bearing Restrictions: No Other Position/Activity Restrictions: WBAT    Mobility  Bed Mobility Overal bed mobility: Modified Independent Bed Mobility: Supine to Sit     Supine to sit: HOB elevated          Transfers Overall transfer level: Modified independent Equipment used: Rolling walker (2 wheeled) Transfers: Sit to/from Stand Sit to Stand: Modified independent (Device/Increase time)            Ambulation/Gait Ambulation/Gait assistance: Mod assist Gait Distance (Feet): 250 Feet Assistive device: Rolling walker (2 wheeled) Gait Pattern/deviations: Step-through pattern;Decreased stride length Gait velocity: fair       Stairs Stairs: Yes       General stair comments: no steps to enter house. discussed technique for curbs   Wheelchair Mobility    Modified Rankin (Stroke Patients Only)       Balance Overall balance assessment: No apparent  balance deficits (not formally assessed)                                          Cognition Arousal/Alertness: Awake/alert Behavior During Therapy: WFL for tasks assessed/performed Overall Cognitive Status: Within Functional Limits for tasks assessed                                        Exercises Total Joint Exercises Ankle Circles/Pumps: AROM;Both;Supine;15 reps Quad Sets: AROM;Strengthening;Left;15 reps;Seated Heel Slides: AROM;Strengthening;Left;15 reps;Supine Long Arc Quad: AROM;Strengthening;Left;15 reps;Supine    General Comments        Pertinent Vitals/Pain Pain Assessment: 0-10 Pain Score: 3  Pain Location: Lt hip Pain Descriptors / Indicators: Discomfort Pain Intervention(s): Limited activity within patient's tolerance;Repositioned;Monitored during session    Home Living                      Prior Function            PT Goals (current goals can now be found in the care plan section) Progress towards PT goals: Progressing toward goals    Frequency    7X/week      PT Plan      Co-evaluation              AM-PAC PT "6 Clicks" Mobility   Outcome Measure  Help needed  turning from your back to your side while in a flat bed without using bedrails?: None Help needed moving from lying on your back to sitting on the side of a flat bed without using bedrails?: None Help needed moving to and from a bed to a chair (including a wheelchair)?: None Help needed standing up from a chair using your arms (e.g., wheelchair or bedside chair)?: None Help needed to walk in hospital room?: None Help needed climbing 3-5 steps with a railing? : A Little 6 Click Score: 23    End of Session   Activity Tolerance: Patient tolerated treatment well Patient left: with call bell/phone within reach;in bed Nurse Communication: Mobility status PT Visit Diagnosis: Muscle weakness (generalized) (M62.81);Difficulty in walking, not  elsewhere classified (R26.2)     Time: 2956-2130 PT Time Calculation (min) (ACUTE ONLY): 25 min  Charges:  $Gait Training: 8-22 mins $Therapeutic Exercise: 8-22 mins                      Greggory Stallion 03/29/2020, 11:11 AM

## 2020-03-30 ENCOUNTER — Encounter (HOSPITAL_COMMUNITY): Payer: Self-pay | Admitting: Orthopedic Surgery

## 2020-04-20 DIAGNOSIS — R69 Illness, unspecified: Secondary | ICD-10-CM | POA: Diagnosis not present

## 2020-04-20 DIAGNOSIS — Z5181 Encounter for therapeutic drug level monitoring: Secondary | ICD-10-CM | POA: Diagnosis not present

## 2020-04-20 DIAGNOSIS — Z79891 Long term (current) use of opiate analgesic: Secondary | ICD-10-CM | POA: Diagnosis not present

## 2020-04-20 DIAGNOSIS — M057A Rheumatoid arthritis with rheumatoid factor of other specified site without organ or systems involvement: Secondary | ICD-10-CM | POA: Diagnosis not present

## 2020-04-20 DIAGNOSIS — Z96642 Presence of left artificial hip joint: Secondary | ICD-10-CM | POA: Diagnosis not present

## 2020-04-25 ENCOUNTER — Ambulatory Visit: Payer: Medicare HMO | Attending: Internal Medicine

## 2020-04-25 DIAGNOSIS — R69 Illness, unspecified: Secondary | ICD-10-CM | POA: Diagnosis not present

## 2020-04-25 DIAGNOSIS — Z79891 Long term (current) use of opiate analgesic: Secondary | ICD-10-CM | POA: Diagnosis not present

## 2020-04-25 DIAGNOSIS — Z20822 Contact with and (suspected) exposure to covid-19: Secondary | ICD-10-CM | POA: Diagnosis not present

## 2020-04-26 LAB — NOVEL CORONAVIRUS, NAA: SARS-CoV-2, NAA: NOT DETECTED

## 2020-04-26 LAB — SARS-COV-2, NAA 2 DAY TAT

## 2020-05-04 ENCOUNTER — Encounter: Payer: Self-pay | Admitting: Internal Medicine

## 2020-05-15 DIAGNOSIS — Z471 Aftercare following joint replacement surgery: Secondary | ICD-10-CM | POA: Diagnosis not present

## 2020-05-15 DIAGNOSIS — Z96642 Presence of left artificial hip joint: Secondary | ICD-10-CM | POA: Diagnosis not present

## 2020-05-16 ENCOUNTER — Other Ambulatory Visit: Payer: Self-pay | Admitting: Cardiology

## 2020-05-24 ENCOUNTER — Ambulatory Visit: Payer: Medicare HMO | Admitting: Cardiology

## 2020-05-24 ENCOUNTER — Encounter: Payer: Self-pay | Admitting: Cardiology

## 2020-05-24 ENCOUNTER — Other Ambulatory Visit: Payer: Self-pay

## 2020-05-24 VITALS — BP 120/72 | HR 62 | Ht 72.0 in | Wt 238.8 lb

## 2020-05-24 DIAGNOSIS — E782 Mixed hyperlipidemia: Secondary | ICD-10-CM

## 2020-05-24 DIAGNOSIS — I5022 Chronic systolic (congestive) heart failure: Secondary | ICD-10-CM | POA: Diagnosis not present

## 2020-05-24 NOTE — Patient Instructions (Signed)
Medication Instructions:  Your physician recommends that you continue on your current medications as directed. Please refer to the Current Medication list given to you today.  *If you need a refill on your cardiac medications before your next appointment, please call your pharmacy*   Lab Work: None If you have labs (blood work) drawn today and your tests are completely normal, you will receive your results only by: MyChart Message (if you have MyChart) OR A paper copy in the mail If you have any lab test that is abnormal or we need to change your treatment, we will call you to review the results.   Testing/Procedures: Your physician has requested that you have an echocardiogram. Echocardiography is a painless test that uses sound waves to create images of your heart. It provides your doctor with information about the size and shape of your heart and how well your heart's chambers and valves are working. This procedure takes approximately one hour. There are no restrictions for this procedure.    Follow-Up: At [facility], you and your health needs are our priority.  As part of our continuing mission to provide you with exceptional heart care, we have created designated Provider Care Teams.  These Care Teams include your primary Cardiologist (physician) and Advanced Practice Providers (APPs -  Physician Assistants and Nurse Practitioners) who all work together to provide you with the care you need, when you need it.  We recommend signing up for the patient portal called "MyChart".  Sign up information is provided on this After Visit Summary.  MyChart is used to connect with patients for Virtual Visits (Telemedicine).  Patients are able to view lab/test results, encounter notes, upcoming appointments, etc.  Non-urgent messages can be sent to your provider as well.   To learn more about what you can do with MyChart, go to [email or web address].    Your next appointment:   6  month(s)  The format for your next appointment:   In Person  Provider:   Jonathan Branch, MD   Other Instructions    
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
## 2020-06-02 ENCOUNTER — Other Ambulatory Visit: Payer: Self-pay | Admitting: Cardiology

## 2020-06-22 ENCOUNTER — Ambulatory Visit (HOSPITAL_COMMUNITY)
Admission: RE | Admit: 2020-06-22 | Discharge: 2020-06-22 | Disposition: A | Discharge status: Home / Self Care | Payer: Medicare HMO | Source: Ambulatory Visit | Attending: Cardiology | Admitting: Cardiology

## 2020-06-22 ENCOUNTER — Other Ambulatory Visit: Payer: Self-pay

## 2020-06-22 DIAGNOSIS — I5022 Chronic systolic (congestive) heart failure: Secondary | ICD-10-CM | POA: Diagnosis not present

## 2020-06-22 LAB — ECHOCARDIOGRAM COMPLETE
AR max vel: 2.37 cm2
AV Area VTI: 2.41 cm2
AV Area mean vel: 2.45 cm2
AV Mean grad: 4.3 mmHg
AV Peak grad: 8.5 mmHg
Ao pk vel: 1.46 m/s
Area-P 1/2: 3.08 cm2
Calc EF: 44.3 %
S' Lateral: 4.1 cm
Single Plane A2C EF: 41.1 %
Single Plane A4C EF: 51.3 %

## 2020-06-22 NOTE — Progress Notes (Signed)
*  PRELIMINARY RESULTS* Echocardiogram 2D Echocardiogram has been performed.  Stacey Drain 06/22/2020, 12:41 PM

## 2020-07-04 DIAGNOSIS — G894 Chronic pain syndrome: Secondary | ICD-10-CM | POA: Diagnosis not present

## 2020-07-04 DIAGNOSIS — I5022 Chronic systolic (congestive) heart failure: Secondary | ICD-10-CM | POA: Diagnosis not present

## 2020-07-04 DIAGNOSIS — I509 Heart failure, unspecified: Secondary | ICD-10-CM | POA: Diagnosis not present

## 2020-07-04 DIAGNOSIS — M1991 Primary osteoarthritis, unspecified site: Secondary | ICD-10-CM | POA: Diagnosis not present

## 2020-07-05 ENCOUNTER — Telehealth: Payer: Self-pay

## 2020-07-05 NOTE — Telephone Encounter (Signed)
-----   Message from Antoine Poche, MD sent at 07/04/2020  3:07 PM EDT ----- Heart function has improved and is in the low normal range, can continue current meds   Dominga Ferry MD

## 2020-07-05 NOTE — Telephone Encounter (Signed)
Pt notified and verbalized understanding. Pt had no questions or concerns at this time.  

## 2020-07-25 ENCOUNTER — Ambulatory Visit: Payer: Medicare HMO | Admitting: Internal Medicine

## 2020-08-04 ENCOUNTER — Other Ambulatory Visit: Payer: Self-pay | Admitting: Cardiology

## 2020-08-16 DIAGNOSIS — L218 Other seborrheic dermatitis: Secondary | ICD-10-CM | POA: Diagnosis not present

## 2020-08-28 ENCOUNTER — Telehealth: Payer: Self-pay | Admitting: Cardiology

## 2020-08-28 MED ORDER — NITROGLYCERIN 0.4 MG SL SUBL: 0.4000 mg | SUBLINGUAL_TABLET | SUBLINGUAL | 0 refills | Status: AC | PRN

## 2020-08-28 NOTE — Telephone Encounter (Signed)
New message     *STAT* If patient is at the pharmacy, call can be transferred to refill team.   1. Which medications need to be refilled? (please list name of each medication and dose if known) nitroglycerin  2. Which pharmacy/location (including street and city if local pharmacy) is medication to be sent to? [facility] [address]   3. Do they need a 30 day or 90 day supply? 30

## 2020-08-28 NOTE — Telephone Encounter (Signed)
Medication refill request approved and sent to [facility] pharmacy

## 2020-08-29 DIAGNOSIS — L570 Actinic keratosis: Secondary | ICD-10-CM | POA: Diagnosis not present

## 2020-08-29 DIAGNOSIS — X32XXXD Exposure to sunlight, subsequent encounter: Secondary | ICD-10-CM | POA: Diagnosis not present

## 2020-08-29 DIAGNOSIS — B078 Other viral warts: Secondary | ICD-10-CM | POA: Diagnosis not present

## 2020-09-18 ENCOUNTER — Inpatient Hospital Stay (HOSPITAL_COMMUNITY): Payer: Medicare HMO

## 2020-09-25 ENCOUNTER — Ambulatory Visit (HOSPITAL_COMMUNITY): Payer: Medicare HMO | Admitting: Hematology

## 2020-10-03 ENCOUNTER — Other Ambulatory Visit: Payer: Self-pay

## 2020-10-03 ENCOUNTER — Inpatient Hospital Stay (HOSPITAL_COMMUNITY): Payer: Medicare HMO | Attending: Hematology

## 2020-10-03 DIAGNOSIS — E559 Vitamin D deficiency, unspecified: Secondary | ICD-10-CM | POA: Diagnosis not present

## 2020-10-03 DIAGNOSIS — D696 Thrombocytopenia, unspecified: Secondary | ICD-10-CM | POA: Diagnosis not present

## 2020-10-03 LAB — CBC WITH DIFFERENTIAL/PLATELET
Abs Immature Granulocytes: 0.06 10*3/uL (ref 0.00–0.07)
Basophils Absolute: 0 10*3/uL (ref 0.0–0.1)
Basophils Relative: 1 %
Eosinophils Absolute: 0.2 10*3/uL (ref 0.0–0.5)
Eosinophils Relative: 4 %
HCT: 43.1 % (ref 39.0–52.0)
Hemoglobin: 14.8 g/dL (ref 13.0–17.0)
Immature Granulocytes: 1 %
Lymphocytes Relative: 37 %
Lymphs Abs: 2.1 10*3/uL (ref 0.7–4.0)
MCH: 32.4 pg (ref 26.0–34.0)
MCHC: 34.3 g/dL (ref 30.0–36.0)
MCV: 94.3 fL (ref 80.0–100.0)
Monocytes Absolute: 0.5 10*3/uL (ref 0.1–1.0)
Monocytes Relative: 9 %
Neutro Abs: 2.8 10*3/uL (ref 1.7–7.7)
Neutrophils Relative %: 48 %
Platelets: 135 10*3/uL — ABNORMAL LOW (ref 150–400)
RBC: 4.57 MIL/uL (ref 4.22–5.81)
RDW: 13.7 % (ref 11.5–15.5)
WBC: 5.7 10*3/uL (ref 4.0–10.5)
nRBC: 0 % (ref 0.0–0.2)

## 2020-10-03 LAB — VITAMIN D 25 HYDROXY (VIT D DEFICIENCY, FRACTURES): Vit D, 25-Hydroxy: 40.85 ng/mL (ref 30–100)

## 2020-10-03 LAB — VITAMIN B12: Vitamin B-12: 702 pg/mL (ref 180–914)

## 2020-10-04 DIAGNOSIS — G894 Chronic pain syndrome: Secondary | ICD-10-CM | POA: Diagnosis not present

## 2020-10-04 DIAGNOSIS — I509 Heart failure, unspecified: Secondary | ICD-10-CM | POA: Diagnosis not present

## 2020-10-04 DIAGNOSIS — M1991 Primary osteoarthritis, unspecified site: Secondary | ICD-10-CM | POA: Diagnosis not present

## 2020-10-04 DIAGNOSIS — I5022 Chronic systolic (congestive) heart failure: Secondary | ICD-10-CM | POA: Diagnosis not present
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
## 2020-10-10 ENCOUNTER — Inpatient Hospital Stay (HOSPITAL_COMMUNITY): Payer: Medicare HMO | Attending: Hematology | Admitting: Hematology

## 2020-10-10 ENCOUNTER — Encounter (HOSPITAL_COMMUNITY): Payer: Self-pay | Admitting: Hematology

## 2020-10-10 ENCOUNTER — Other Ambulatory Visit: Payer: Self-pay

## 2020-10-10 VITALS — BP 101/57 | HR 60 | Temp 96.9°F | Resp 18 | Wt 236.6 lb

## 2020-10-10 DIAGNOSIS — X32XXXD Exposure to sunlight, subsequent encounter: Secondary | ICD-10-CM | POA: Diagnosis not present

## 2020-10-10 DIAGNOSIS — E039 Hypothyroidism, unspecified: Secondary | ICD-10-CM | POA: Diagnosis not present

## 2020-10-10 DIAGNOSIS — Z79899 Other long term (current) drug therapy: Secondary | ICD-10-CM | POA: Insufficient documentation

## 2020-10-10 DIAGNOSIS — D696 Thrombocytopenia, unspecified: Secondary | ICD-10-CM | POA: Diagnosis not present

## 2020-10-10 DIAGNOSIS — I252 Old myocardial infarction: Secondary | ICD-10-CM | POA: Insufficient documentation

## 2020-10-10 DIAGNOSIS — L218 Other seborrheic dermatitis: Secondary | ICD-10-CM | POA: Diagnosis not present

## 2020-10-10 DIAGNOSIS — M069 Rheumatoid arthritis, unspecified: Secondary | ICD-10-CM | POA: Insufficient documentation

## 2020-10-10 DIAGNOSIS — L718 Other rosacea: Secondary | ICD-10-CM | POA: Diagnosis not present

## 2020-10-10 DIAGNOSIS — E538 Deficiency of other specified B group vitamins: Secondary | ICD-10-CM | POA: Insufficient documentation

## 2020-10-10 DIAGNOSIS — L570 Actinic keratosis: Secondary | ICD-10-CM | POA: Diagnosis not present

## 2020-10-10 DIAGNOSIS — B078 Other viral warts: Secondary | ICD-10-CM | POA: Diagnosis not present

## 2020-10-10 DIAGNOSIS — E559 Vitamin D deficiency, unspecified: Secondary | ICD-10-CM | POA: Insufficient documentation

## 2020-10-10 DIAGNOSIS — Z7952 Long term (current) use of systemic steroids: Secondary | ICD-10-CM | POA: Diagnosis not present

## 2020-10-10 NOTE — Patient Instructions (Signed)
[facility] at [facility] Discharge Instructions  You were seen today by Dr. Katragadda. He went over your recent results. Dr. Katragadda will see you back in 1 year for labs and follow up.   Thank you for choosing [facility] at [facility] to provide your oncology and hematology care.  To afford each patient quality time with our provider, please arrive at least 15 minutes before your scheduled appointment time.   If you have a lab appointment with the Cancer Center please come in thru the Main Entrance and check in at the main information desk  You need to re-schedule your appointment should you arrive 10 or more minutes late.  We strive to give you quality time with our providers, and arriving late affects you and other patients whose appointments are after yours.  Also, if you no show three or more times for appointments you may be dismissed from the clinic at the providers discretion.     Again, thank you for choosing [facility].  Our hope is that these requests will decrease the amount of time that you wait before being seen by our physicians.       _____________________________________________________________  Should you have questions after your visit to [facility], please contact our office at [phone] between the hours of 8:00 a.m. and 4:30 p.m.  Voicemails left after 4:00 p.m. will not be returned until the following business day.  For prescription refill requests, have your pharmacy contact our office and allow 72 hours.    Cancer Center Support Programs:   > Cancer Support Group  2nd Tuesday of the month 1pm-2pm, Journey Room    

## 2020-10-11 DIAGNOSIS — M06831 Other specified rheumatoid arthritis, right wrist: Secondary | ICD-10-CM | POA: Diagnosis not present

## 2020-10-11 DIAGNOSIS — M25531 Pain in right wrist: Secondary | ICD-10-CM | POA: Diagnosis not present

## 2020-11-01 DIAGNOSIS — M1990 Unspecified osteoarthritis, unspecified site: Secondary | ICD-10-CM | POA: Diagnosis not present

## 2020-11-01 DIAGNOSIS — M057A Rheumatoid arthritis with rheumatoid factor of other specified site without organ or systems involvement: Secondary | ICD-10-CM | POA: Diagnosis not present

## 2020-11-01 DIAGNOSIS — Z23 Encounter for immunization: Secondary | ICD-10-CM | POA: Diagnosis not present

## 2020-11-01 DIAGNOSIS — M05711 Rheumatoid arthritis with rheumatoid factor of right shoulder without organ or systems involvement: Secondary | ICD-10-CM | POA: Diagnosis not present

## 2020-11-08 DIAGNOSIS — M0570 Rheumatoid arthritis with rheumatoid factor of unspecified site without organ or systems involvement: Secondary | ICD-10-CM | POA: Diagnosis not present

## 2020-11-14 ENCOUNTER — Other Ambulatory Visit: Payer: Self-pay | Admitting: Physician Assistant

## 2020-11-14 NOTE — Telephone Encounter (Signed)
This is a [place] pt.  °

## 2020-11-21 ENCOUNTER — Other Ambulatory Visit: Payer: Self-pay | Admitting: Cardiology

## 2020-12-12 DIAGNOSIS — E063 Autoimmune thyroiditis: Secondary | ICD-10-CM | POA: Diagnosis not present

## 2020-12-12 DIAGNOSIS — Z6832 Body mass index (BMI) 32.0-32.9, adult: Secondary | ICD-10-CM | POA: Diagnosis not present

## 2020-12-12 DIAGNOSIS — E559 Vitamin D deficiency, unspecified: Secondary | ICD-10-CM | POA: Diagnosis not present

## 2020-12-12 DIAGNOSIS — Z0001 Encounter for general adult medical examination with abnormal findings: Secondary | ICD-10-CM | POA: Diagnosis not present

## 2020-12-12 DIAGNOSIS — I5022 Chronic systolic (congestive) heart failure: Secondary | ICD-10-CM | POA: Diagnosis not present

## 2020-12-12 DIAGNOSIS — Z125 Encounter for screening for malignant neoplasm of prostate: Secondary | ICD-10-CM | POA: Diagnosis not present

## 2020-12-12 DIAGNOSIS — G894 Chronic pain syndrome: Secondary | ICD-10-CM | POA: Diagnosis not present

## 2020-12-12 DIAGNOSIS — E782 Mixed hyperlipidemia: Secondary | ICD-10-CM | POA: Diagnosis not present

## 2020-12-12 DIAGNOSIS — E538 Deficiency of other specified B group vitamins: Secondary | ICD-10-CM | POA: Diagnosis not present

## 2020-12-12 DIAGNOSIS — M069 Rheumatoid arthritis, unspecified: Secondary | ICD-10-CM | POA: Diagnosis not present

## 2020-12-12 DIAGNOSIS — E6609 Other obesity due to excess calories: Secondary | ICD-10-CM | POA: Diagnosis not present

## 2020-12-12 DIAGNOSIS — E039 Hypothyroidism, unspecified: Secondary | ICD-10-CM | POA: Diagnosis not present

## 2020-12-12 DIAGNOSIS — Z1331 Encounter for screening for depression: Secondary | ICD-10-CM | POA: Diagnosis not present

## 2020-12-19 DIAGNOSIS — M13832 Other specified arthritis, left wrist: Secondary | ICD-10-CM | POA: Diagnosis not present

## 2020-12-21 ENCOUNTER — Other Ambulatory Visit: Payer: Self-pay | Admitting: Gastroenterology
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
## 2021-01-04 ENCOUNTER — Encounter: Payer: Self-pay | Admitting: Student

## 2021-01-04 ENCOUNTER — Ambulatory Visit: Payer: Medicare HMO | Admitting: Student

## 2021-01-04 ENCOUNTER — Other Ambulatory Visit: Payer: Self-pay

## 2021-01-04 VITALS — BP 124/68 | HR 60 | Ht 72.0 in | Wt 238.0 lb

## 2021-01-04 DIAGNOSIS — Z789 Other specified health status: Secondary | ICD-10-CM

## 2021-01-04 DIAGNOSIS — E782 Mixed hyperlipidemia: Secondary | ICD-10-CM | POA: Diagnosis not present

## 2021-01-04 DIAGNOSIS — I77810 Thoracic aortic ectasia: Secondary | ICD-10-CM

## 2021-01-04 DIAGNOSIS — I428 Other cardiomyopathies: Secondary | ICD-10-CM

## 2021-01-04 NOTE — Patient Instructions (Signed)
Medication Instructions:  STOP Spironolactone   Labwork: None today   Testing/Procedures: None today   Follow-Up: 6 months with Dr.Branch  Any Other Special Instructions Will Be Listed Below (If Applicable).  If you need a refill on your cardiac medications before your next appointment, please call your pharmacy.

## 2021-02-14 DIAGNOSIS — M25532 Pain in left wrist: Secondary | ICD-10-CM | POA: Diagnosis not present

## 2021-02-14 DIAGNOSIS — M67834 Other specified disorders of tendon, left wrist: Secondary | ICD-10-CM | POA: Diagnosis not present

## 2021-03-26 IMAGING — RF DG HIP (WITH PELVIS) OPERATIVE*L*
1 series · 4 of 4 positions shown · non-contrast
Comparison: 11/03/2019

CLINICAL DATA: Left hip replacement

EXAM:
OPERATIVE left HIP (WITH PELVIS IF PERFORMED) 4 VIEWS
TECHNIQUE: Fluoroscopic spot image(s) were submitted for interpretation
post-operatively.

[Series 1: unknown protocol · 0.20mm/px · 4 of 4 slices shown]
[im 1/4]
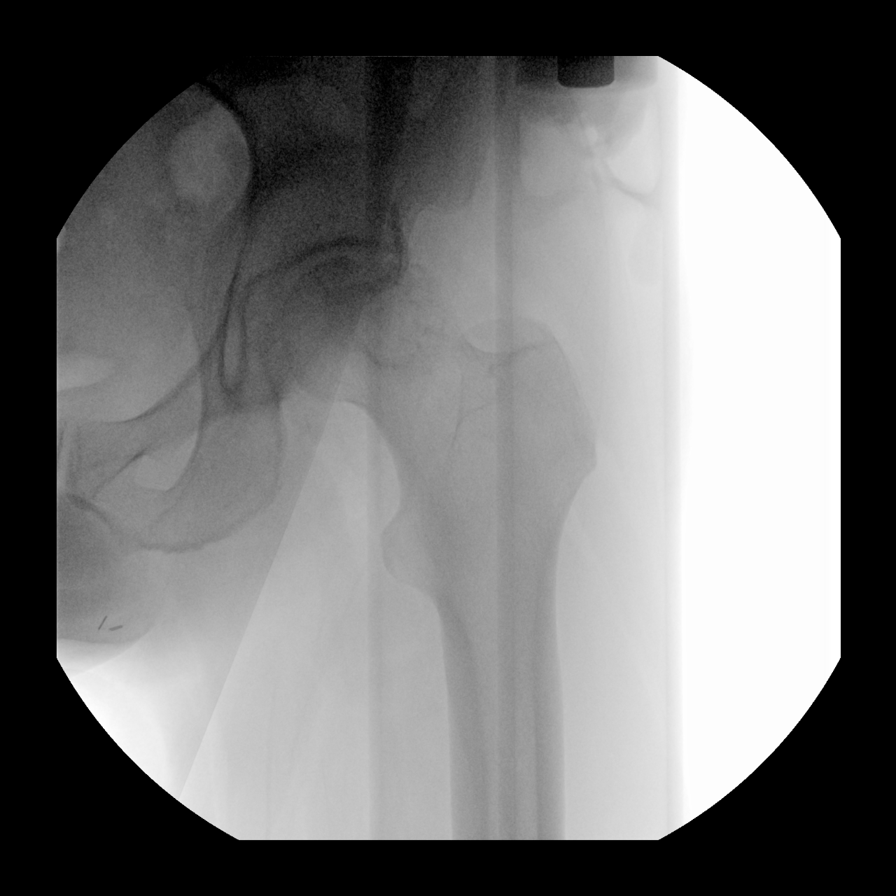
[im 2/4]
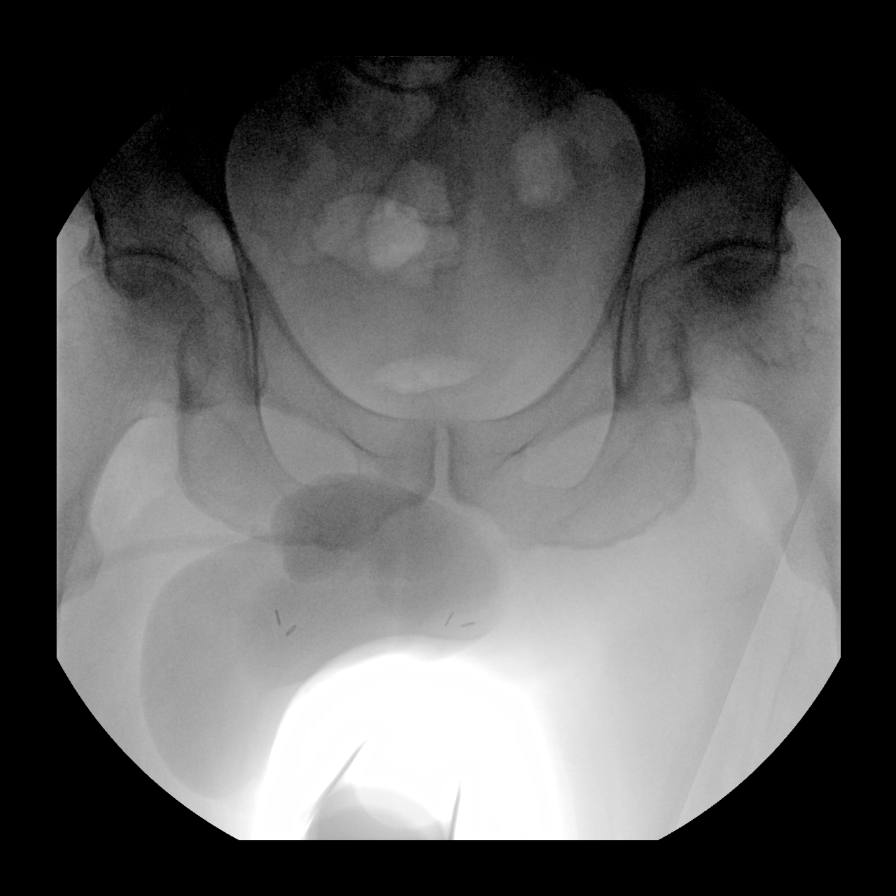
[im 3/4]
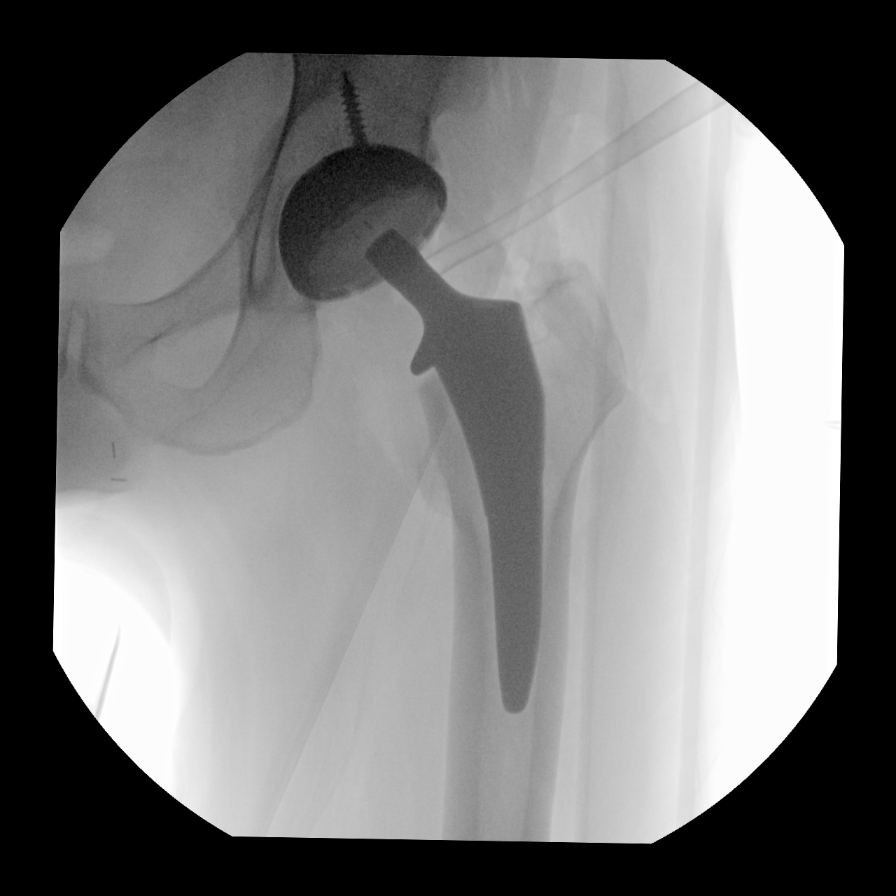
[im 4/4]
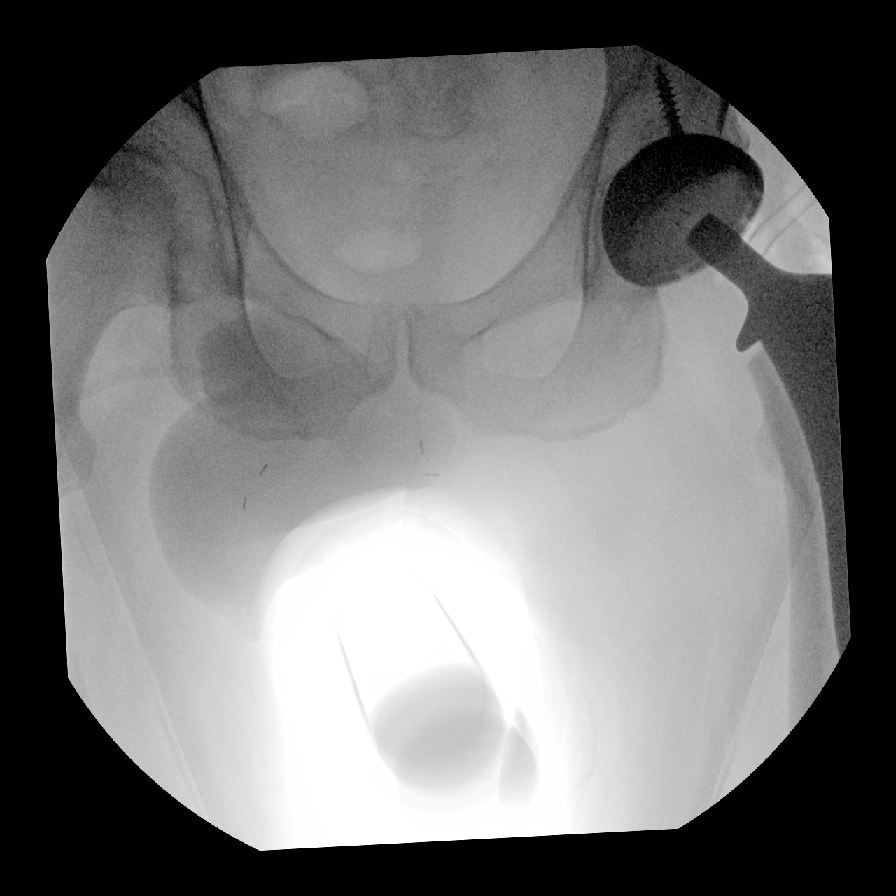

[4 of 4 positions shown; findings below may reference images not displayed]

FINDINGS: Left hip replacement in satisfactory position and alignment. No
fracture or complication.
IMPRESSION: Satisfactory left hip replacement.

## 2021-04-04 DIAGNOSIS — B9689 Other specified bacterial agents as the cause of diseases classified elsewhere: Secondary | ICD-10-CM | POA: Diagnosis not present

## 2021-04-04 DIAGNOSIS — L820 Inflamed seborrheic keratosis: Secondary | ICD-10-CM | POA: Diagnosis not present

## 2021-04-04 DIAGNOSIS — L02821 Furuncle of head [any part, except face]: Secondary | ICD-10-CM | POA: Diagnosis not present

## 2021-04-25 DIAGNOSIS — B9689 Other specified bacterial agents as the cause of diseases classified elsewhere: Secondary | ICD-10-CM | POA: Diagnosis not present

## 2021-04-25 DIAGNOSIS — L02821 Furuncle of head [any part, except face]: Secondary | ICD-10-CM | POA: Diagnosis not present

## 2021-04-30 ENCOUNTER — Other Ambulatory Visit: Payer: Self-pay | Admitting: Cardiology

## 2021-04-30 DIAGNOSIS — Z96642 Presence of left artificial hip joint: Secondary | ICD-10-CM | POA: Diagnosis not present

## 2021-04-30 DIAGNOSIS — M1611 Unilateral primary osteoarthritis, right hip: Secondary | ICD-10-CM | POA: Diagnosis not present

## 2021-05-29 DIAGNOSIS — M057A Rheumatoid arthritis with rheumatoid factor of other specified site without organ or systems involvement: Secondary | ICD-10-CM | POA: Diagnosis not present

## 2021-05-31 DIAGNOSIS — M057A Rheumatoid arthritis with rheumatoid factor of other specified site without organ or systems involvement: Secondary | ICD-10-CM | POA: Diagnosis not present

## 2021-05-31 DIAGNOSIS — Z5181 Encounter for therapeutic drug level monitoring: Secondary | ICD-10-CM | POA: Diagnosis not present

## 2021-06-29 ENCOUNTER — Other Ambulatory Visit: Payer: Self-pay | Admitting: Cardiology

## 2021-07-11 ENCOUNTER — Ambulatory Visit: Payer: Medicare HMO | Admitting: Cardiology

## 2021-07-11 ENCOUNTER — Telehealth: Payer: Self-pay

## 2021-07-11 ENCOUNTER — Other Ambulatory Visit (HOSPITAL_COMMUNITY)
Admission: RE | Admit: 2021-07-11 | Discharge: 2021-07-11 | Disposition: A | Discharge status: Home / Self Care | Payer: Medicare HMO | Source: Ambulatory Visit | Attending: Cardiology | Admitting: Cardiology

## 2021-07-11 ENCOUNTER — Encounter: Payer: Self-pay | Admitting: Cardiology

## 2021-07-11 VITALS — BP 120/78 | HR 58 | Ht 72.0 in | Wt 233.6 lb

## 2021-07-11 DIAGNOSIS — I719 Aortic aneurysm of unspecified site, without rupture: Secondary | ICD-10-CM | POA: Insufficient documentation

## 2021-07-11 DIAGNOSIS — I77810 Thoracic aortic ectasia: Secondary | ICD-10-CM

## 2021-07-11 DIAGNOSIS — I428 Other cardiomyopathies: Secondary | ICD-10-CM

## 2021-07-11 DIAGNOSIS — E782 Mixed hyperlipidemia: Secondary | ICD-10-CM | POA: Diagnosis not present

## 2021-07-11 LAB — BASIC METABOLIC PANEL
Anion gap: 2 — ABNORMAL LOW (ref 5–15)
BUN: 14 mg/dL (ref 8–23)
CO2: 29 mmol/L (ref 22–32)
Calcium: 8.9 mg/dL (ref 8.9–10.3)
Chloride: 108 mmol/L (ref 98–111)
Creatinine, Ser: 0.9 mg/dL (ref 0.61–1.24)
GFR, Estimated: >60 mL/min (ref >60)
Glucose, Bld: 102 mg/dL — ABNORMAL HIGH (ref 70–99)
Potassium: 4.2 mmol/L (ref 3.5–5.1)
Sodium: 139 mmol/L (ref 135–145)

## 2021-07-11 NOTE — Telephone Encounter (Signed)
Patient notified and verbalized understanding. Pt had no questions or concerns at this time. PCP copied.  ?

## 2021-07-11 NOTE — Telephone Encounter (Signed)
-----   Message from Arnoldo Lenis, MD sent at 07/11/2021  3:49 PM EDT ----- Normal labs   Zandra Abts MD
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
## 2021-07-11 NOTE — Patient Instructions (Signed)
Medication Instructions:  Your physician recommends that you continue on your current medications as directed. Please refer to the Current Medication list given to you today.   Labwork: BMET  Testing/Procedures: CTA CHEST- AA  Follow-Up: Follow up with Dr. Harl Bowie in 6 months.   Any Other Special Instructions Will Be Listed Below (If Applicable).     If you need a refill on your cardiac medications before your next appointment, please call your pharmacy.

## 2021-07-23 ENCOUNTER — Other Ambulatory Visit: Payer: Self-pay | Admitting: Cardiology

## 2021-07-30 ENCOUNTER — Ambulatory Visit (HOSPITAL_COMMUNITY)
Admission: RE | Admit: 2021-07-30 | Discharge: 2021-07-30 | Disposition: A | Discharge status: Home / Self Care | Payer: Medicare HMO | Source: Ambulatory Visit | Attending: Cardiology | Admitting: Cardiology

## 2021-07-30 DIAGNOSIS — I719 Aortic aneurysm of unspecified site, without rupture: Secondary | ICD-10-CM | POA: Insufficient documentation

## 2021-07-30 DIAGNOSIS — I7121 Aneurysm of the ascending aorta, without rupture: Secondary | ICD-10-CM | POA: Diagnosis not present

## 2021-07-30 MED ORDER — IOHEXOL 350 MG/ML SOLN
80.0000 mL | Freq: Once | INTRAVENOUS | Status: AC | PRN
  Administered 2021-07-30: 80 mL via INTRAVENOUS

## 2021-09-23 ENCOUNTER — Other Ambulatory Visit: Payer: Self-pay | Admitting: Cardiology

## 2021-10-05 ENCOUNTER — Telehealth: Payer: Self-pay | Admitting: *Deleted

## 2021-10-05 NOTE — Patient Outreach (Signed)
  Care Coordination   10/05/2021 Name: Brendan [name] MRN: [id number] DOB: 1955-06-02   Care Coordination Outreach Attempts:  An unsuccessful telephone outreach was attempted today to offer the patient information about available care coordination services as a benefit of their health plan.   Follow Up Plan:  Additional outreach attempts will be made to offer the patient care coordination information and services.   Encounter Outcome:  No Answer  Care Coordination Interventions Activated:  No   Care Coordination Interventions:  No, not indicated    Demetrios Loll, BSN, RN-BC RN Care Coordinator [facility] [employer] Dial: [phone]

## 2021-10-15 ENCOUNTER — Other Ambulatory Visit: Payer: Self-pay

## 2021-10-15 DIAGNOSIS — D696 Thrombocytopenia, unspecified: Secondary | ICD-10-CM

## 2021-10-16 ENCOUNTER — Inpatient Hospital Stay: Payer: Medicare HMO | Attending: Hematology

## 2021-10-16 DIAGNOSIS — E559 Vitamin D deficiency, unspecified: Secondary | ICD-10-CM | POA: Diagnosis not present

## 2021-10-16 DIAGNOSIS — M069 Rheumatoid arthritis, unspecified: Secondary | ICD-10-CM | POA: Diagnosis not present

## 2021-10-16 DIAGNOSIS — Z79899 Other long term (current) drug therapy: Secondary | ICD-10-CM | POA: Insufficient documentation

## 2021-10-16 DIAGNOSIS — E538 Deficiency of other specified B group vitamins: Secondary | ICD-10-CM | POA: Insufficient documentation

## 2021-10-16 DIAGNOSIS — D696 Thrombocytopenia, unspecified: Secondary | ICD-10-CM | POA: Insufficient documentation

## 2021-10-16 LAB — CBC WITH DIFFERENTIAL/PLATELET
Abs Immature Granulocytes: 0 10*3/uL (ref 0.00–0.07)
Basophils Absolute: 0 10*3/uL (ref 0.0–0.1)
Basophils Relative: 1 %
Eosinophils Absolute: 0.2 10*3/uL (ref 0.0–0.5)
Eosinophils Relative: 6 %
HCT: 41.2 % (ref 39.0–52.0)
Hemoglobin: 14.6 g/dL (ref 13.0–17.0)
Immature Granulocytes: 0 %
Lymphocytes Relative: 36 %
Lymphs Abs: 1.2 10*3/uL (ref 0.7–4.0)
MCH: 32.9 pg (ref 26.0–34.0)
MCHC: 35.4 g/dL (ref 30.0–36.0)
MCV: 92.8 fL (ref 80.0–100.0)
Monocytes Absolute: 0.3 10*3/uL (ref 0.1–1.0)
Monocytes Relative: 11 %
Neutro Abs: 1.5 10*3/uL — ABNORMAL LOW (ref 1.7–7.7)
Neutrophils Relative %: 46 %
Platelets: 102 10*3/uL — ABNORMAL LOW (ref 150–400)
RBC: 4.44 MIL/uL (ref 4.22–5.81)
RDW: 12.9 % (ref 11.5–15.5)
WBC: 3.2 10*3/uL — ABNORMAL LOW (ref 4.0–10.5)
nRBC: 0 % (ref 0.0–0.2)

## 2021-10-16 LAB — COMPREHENSIVE METABOLIC PANEL
ALT: 35 U/L (ref 0–44)
AST: 23 U/L (ref 15–41)
Albumin: 3.8 g/dL (ref 3.5–5.0)
Alkaline Phosphatase: 32 U/L — ABNORMAL LOW (ref 38–126)
Anion gap: 5 (ref 5–15)
BUN: 18 mg/dL (ref 8–23)
CO2: 27 mmol/L (ref 22–32)
Calcium: 8.9 mg/dL (ref 8.9–10.3)
Chloride: 107 mmol/L (ref 98–111)
Creatinine, Ser: 0.9 mg/dL (ref 0.61–1.24)
GFR, Estimated: >60 mL/min (ref >60)
Glucose, Bld: 114 mg/dL — ABNORMAL HIGH (ref 70–99)
Potassium: 3.9 mmol/L (ref 3.5–5.1)
Sodium: 139 mmol/L (ref 135–145)
Total Bilirubin: 1.3 mg/dL — ABNORMAL HIGH (ref 0.3–1.2)
Total Protein: 6.3 g/dL — ABNORMAL LOW (ref 6.5–8.1)

## 2021-10-16 LAB — VITAMIN D 25 HYDROXY (VIT D DEFICIENCY, FRACTURES): Vit D, 25-Hydroxy: 49.86 ng/mL (ref 30–100)

## 2021-10-16 LAB — LACTATE DEHYDROGENASE: LDH: 110 U/L (ref 98–192)

## 2021-10-16 LAB — VITAMIN B12: Vitamin B-12: 596 pg/mL (ref 180–914)

## 2021-10-19 LAB — METHYLMALONIC ACID, SERUM: Methylmalonic Acid, Quantitative: 270 nmol/L (ref 0–378)
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
## 2021-10-23 ENCOUNTER — Inpatient Hospital Stay (HOSPITAL_BASED_OUTPATIENT_CLINIC_OR_DEPARTMENT_OTHER): Payer: Medicare HMO | Admitting: Physician Assistant

## 2021-10-23 VITALS — BP 116/66 | HR 55 | Temp 97.9°F | Resp 17 | Ht 72.0 in | Wt 236.5 lb

## 2021-10-23 DIAGNOSIS — D696 Thrombocytopenia, unspecified: Secondary | ICD-10-CM | POA: Diagnosis not present

## 2021-10-23 DIAGNOSIS — M069 Rheumatoid arthritis, unspecified: Secondary | ICD-10-CM | POA: Diagnosis not present

## 2021-10-23 DIAGNOSIS — E559 Vitamin D deficiency, unspecified: Secondary | ICD-10-CM | POA: Diagnosis not present

## 2021-10-23 DIAGNOSIS — Z79899 Other long term (current) drug therapy: Secondary | ICD-10-CM | POA: Diagnosis not present

## 2021-10-23 DIAGNOSIS — E538 Deficiency of other specified B group vitamins: Secondary | ICD-10-CM | POA: Diagnosis not present

## 2021-10-23 NOTE — Patient Instructions (Signed)
[place] [facility] **   You were seen today by Tarri Abernethy PA-C for your low platelets.   Your low platelets may be related to your Kevzara medication or may be caused by immune system dysfunction. Your white blood cells are also slightly low. You do not need any treatment for your low blood cells at this time, but we will recheck these labs in 6 months.  FOLLOW-UP APPOINTMENT: Phone visit in 6 months, after labs  ** Thank you for trusting me with your healthcare!  I strive to provide all of my patients with quality care at each visit.  If you receive a survey for this visit, I would be so grateful to you for taking the time to provide feedback.  Thank you in advance!  ~ Deliyah Muckle                   Dr. Derek Jack   &   Tarri Abernethy, PA-C   - - - - - - - - - - - - - - - - - -    Thank you for choosing [place] at [facility] to provide your oncology and hematology care.  To afford each patient quality time with our provider, please arrive at least 15 minutes before your scheduled appointment time.   If you have a lab appointment with the [place] please come in thru the Main Entrance and check in at the main information desk.  You need to re-schedule your appointment should you arrive 10 or more minutes late.  We strive to give you quality time with our providers, and arriving late affects you and other patients whose appointments are after yours.  Also, if you no show three or more times for appointments you may be dismissed from the clinic at the providers discretion.     Again, thank you for choosing [facility].  Our hope is that these requests will decrease the amount of time that you wait before being seen by our physicians.       _____________________________________________________________  Should you have questions after your visit to [facility], please contact our office at [phone] and follow the prompts.  Our office hours are 8:00 a.m. and 4:30 p.m. Monday - Friday.  Please note that voicemails left after 4:00 p.m. may not be returned until the following business day.  We are closed weekends and major holidays.  You do have access to a nurse 24-7, just call the main number to the clinic [phone] and do not press any options, hold on the line and a nurse will answer the phone.    For prescription refill requests, have your pharmacy contact our office and allow 72 hours.

## 2021-11-15 ENCOUNTER — Other Ambulatory Visit: Payer: Self-pay | Admitting: *Deleted

## 2021-11-15 NOTE — Patient Outreach (Signed)
  Care Coordination   11/15/2021  Name: Brendan [name] MRN: [id number] DOB: 1955/08/17   Care Coordination Outreach Attempts:  A second unsuccessful outreach was attempted today to offer the patient with information about available care coordination services as a benefit of their health plan.   HIPAA compliant message left on voicemail, providing contact information for CSW, encouraging patient to return CSW's call at his earliest convenience.  Follow Up Plan:  Additional outreach attempts will be made to offer the patient care coordination information and services.   Encounter Outcome:  No Answer.   Care Coordination Interventions Activated:  No.    Care Coordination Interventions:  No, not indicated.    Nat Christen, BSW, MSW, LCSW  Licensed Education officer, [facility]  [address] [place] [address] [place], [place] [address] Physical Address-[address], [place], [place] [address] Toll Free Main # [phone] Fax # [phone] Cell # [phone] [email or web address]

## 2021-11-19 ENCOUNTER — Telehealth: Payer: Self-pay | Admitting: *Deleted

## 2021-11-19 NOTE — Patient Outreach (Signed)
  Care Coordination   11/19/2021 Name: Brendan [name] MRN: [id number] DOB: [birth year]   Care Coordination Outreach Attempts:  A third unsuccessful outreach was attempted today to offer the patient with information about available care coordination services as a benefit of their health plan.   Follow Up Plan:  No further outreach attempts will be made at this time. We have been unable to contact the patient to offer or enroll patient in care coordination services  Encounter Outcome:  No Answer  Care Coordination Interventions Activated:  No   Care Coordination Interventions:  No, not indicated    Valente David, RN, MSN, [facility] Care Management Care Management Coordinator [phone]

## 2021-12-04 DIAGNOSIS — Z23 Encounter for immunization: Secondary | ICD-10-CM | POA: Diagnosis not present

## 2021-12-04 DIAGNOSIS — Z79899 Other long term (current) drug therapy: Secondary | ICD-10-CM | POA: Diagnosis not present

## 2021-12-04 DIAGNOSIS — D696 Thrombocytopenia, unspecified: Secondary | ICD-10-CM | POA: Diagnosis not present

## 2021-12-10 ENCOUNTER — Other Ambulatory Visit: Payer: Self-pay | Admitting: Gastroenterology
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
## 2021-12-11 ENCOUNTER — Telehealth: Payer: Self-pay

## 2021-12-11 ENCOUNTER — Telehealth: Payer: Self-pay | Admitting: Gastroenterology

## 2021-12-11 ENCOUNTER — Ambulatory Visit (INDEPENDENT_AMBULATORY_CARE_PROVIDER_SITE_OTHER): Payer: Medicare HMO | Admitting: Gastroenterology

## 2021-12-11 ENCOUNTER — Encounter: Payer: Self-pay | Admitting: Gastroenterology

## 2021-12-11 VITALS — BP 121/74 | HR 64 | Temp 97.7°F | Ht 72.0 in | Wt 238.8 lb

## 2021-12-11 DIAGNOSIS — K760 Fatty (change of) liver, not elsewhere classified: Secondary | ICD-10-CM

## 2021-12-11 DIAGNOSIS — K219 Gastro-esophageal reflux disease without esophagitis: Secondary | ICD-10-CM | POA: Insufficient documentation

## 2021-12-11 MED ORDER — PANTOPRAZOLE SODIUM 40 MG PO TBEC
40.0000 mg | DELAYED_RELEASE_TABLET | Freq: Every day | ORAL | 3 refills | Status: DC
Start: 2021-12-11 — End: 2022-09-04

## 2021-12-11 NOTE — Telephone Encounter (Signed)
Pt's medication list has been updated in his chart and today's visit has been addended to reflect to current med list.

## 2021-12-11 NOTE — Telephone Encounter (Signed)
Please NIC for abdominal ultrasound May 2024 for history of fatty liver, thrombocytopenia

## 2021-12-11 NOTE — Telephone Encounter (Signed)
noted 

## 2021-12-11 NOTE — Addendum Note (Signed)
Addended by: Orland Jarred on: 12/11/2021 01:18 PM   Modules accepted: Orders

## 2021-12-11 NOTE — Patient Instructions (Signed)
Continue to have your liver labs done twice a year. If your numbers start to rise, please let us know. If you notice more regular discomfort in the right upper abdomen, please let me know so that we can order an ultrasound of your liver. Continue pantoprazole 40 mg daily before breakfast.  New prescription sent to your pharmacy. Return to the office in 2 years.  At that time we will schedule you for your 10-year follow-up colonoscopy.

## 2022-01-15 ENCOUNTER — Ambulatory Visit: Payer: Medicare HMO | Attending: Cardiology | Admitting: Cardiology

## 2022-01-15 ENCOUNTER — Encounter: Payer: Self-pay | Admitting: Cardiology

## 2022-01-15 VITALS — BP 116/76 | HR 57 | Ht 72.0 in | Wt 239.0 lb

## 2022-01-15 DIAGNOSIS — I6529 Occlusion and stenosis of unspecified carotid artery: Secondary | ICD-10-CM | POA: Diagnosis not present

## 2022-01-15 DIAGNOSIS — I719 Aortic aneurysm of unspecified site, without rupture: Secondary | ICD-10-CM | POA: Diagnosis not present

## 2022-01-15 DIAGNOSIS — I5022 Chronic systolic (congestive) heart failure: Secondary | ICD-10-CM | POA: Diagnosis not present

## 2022-01-15 DIAGNOSIS — E782 Mixed hyperlipidemia: Secondary | ICD-10-CM

## 2022-01-15 NOTE — Patient Instructions (Signed)
Medication Instructions:  Your physician recommends that you continue on your current medications as directed. Please refer to the Current Medication list given to you today.   Labwork: None  Testing/Procedures: Your physician has requested that you have a carotid duplex. This test is an ultrasound of the carotid arteries in your neck. It looks at blood flow through these arteries that supply the brain with blood. Allow one hour for this exam. There are no restrictions or special instructions.   Follow-Up: Follow up with Dr. Branch in 6 months.   Any Other Special Instructions Will Be Listed Below (If Applicable).     If you need a refill on your cardiac medications before your next appointment, please call your pharmacy.  
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
## 2022-01-16 ENCOUNTER — Encounter: Payer: Self-pay | Admitting: Internal Medicine

## 2022-01-18 DIAGNOSIS — D518 Other vitamin B12 deficiency anemias: Secondary | ICD-10-CM | POA: Diagnosis not present

## 2022-01-18 DIAGNOSIS — Z1331 Encounter for screening for depression: Secondary | ICD-10-CM | POA: Diagnosis not present

## 2022-01-18 DIAGNOSIS — I5022 Chronic systolic (congestive) heart failure: Secondary | ICD-10-CM | POA: Diagnosis not present

## 2022-01-18 DIAGNOSIS — Z0001 Encounter for general adult medical examination with abnormal findings: Secondary | ICD-10-CM | POA: Diagnosis not present

## 2022-01-18 DIAGNOSIS — I214 Non-ST elevation (NSTEMI) myocardial infarction: Secondary | ICD-10-CM | POA: Diagnosis not present

## 2022-01-18 DIAGNOSIS — Z125 Encounter for screening for malignant neoplasm of prostate: Secondary | ICD-10-CM | POA: Diagnosis not present

## 2022-01-18 DIAGNOSIS — G894 Chronic pain syndrome: Secondary | ICD-10-CM | POA: Diagnosis not present

## 2022-01-18 DIAGNOSIS — E559 Vitamin D deficiency, unspecified: Secondary | ICD-10-CM | POA: Diagnosis not present

## 2022-01-18 DIAGNOSIS — M069 Rheumatoid arthritis, unspecified: Secondary | ICD-10-CM | POA: Diagnosis not present

## 2022-01-18 DIAGNOSIS — E039 Hypothyroidism, unspecified: Secondary | ICD-10-CM | POA: Diagnosis not present

## 2022-01-18 DIAGNOSIS — E6609 Other obesity due to excess calories: Secondary | ICD-10-CM | POA: Diagnosis not present

## 2022-01-18 DIAGNOSIS — K219 Gastro-esophageal reflux disease without esophagitis: Secondary | ICD-10-CM | POA: Diagnosis not present

## 2022-01-18 DIAGNOSIS — Z6832 Body mass index (BMI) 32.0-32.9, adult: Secondary | ICD-10-CM | POA: Diagnosis not present

## 2022-01-18 DIAGNOSIS — M1991 Primary osteoarthritis, unspecified site: Secondary | ICD-10-CM | POA: Diagnosis not present

## 2022-01-22 ENCOUNTER — Ambulatory Visit (HOSPITAL_COMMUNITY)
Admission: RE | Admit: 2022-01-22 | Discharge: 2022-01-22 | Disposition: A | Discharge status: Home / Self Care | Payer: Medicare HMO | Source: Ambulatory Visit | Attending: Cardiology | Admitting: Cardiology

## 2022-01-22 DIAGNOSIS — I6523 Occlusion and stenosis of bilateral carotid arteries: Secondary | ICD-10-CM | POA: Diagnosis not present

## 2022-01-22 DIAGNOSIS — I6529 Occlusion and stenosis of unspecified carotid artery: Secondary | ICD-10-CM | POA: Insufficient documentation

## 2022-03-01 ENCOUNTER — Other Ambulatory Visit: Payer: Self-pay | Admitting: Cardiology

## 2022-03-01 ENCOUNTER — Telehealth: Payer: Self-pay | Admitting: Cardiology

## 2022-03-01 MED ORDER — ENTRESTO 97-103 MG PO TABS: 1.0000 | ORAL_TABLET | Freq: Two times a day (BID) | ORAL | 0 refills | Status: DC

## 2022-03-01 NOTE — Telephone Encounter (Signed)
Completed.

## 2022-03-01 NOTE — Telephone Encounter (Signed)
*  STAT* If patient is at the pharmacy, call can be transferred to refill team.   1. Which medications need to be refilled? (please list name of each medication and dose if known)   ENTRESTO 97-103 MG   2. Which pharmacy/location (including street and city if local pharmacy) is medication to be sent to?  [id number] - [place], [place] [address]   3. Do they need a 30 day or 90 day supply?   30 day  Patient stated he only has 2 tablets left.

## 2022-03-20 ENCOUNTER — Other Ambulatory Visit: Payer: Self-pay | Admitting: Cardiology

## 2022-03-22 DIAGNOSIS — M25561 Pain in right knee: Secondary | ICD-10-CM | POA: Diagnosis not present

## 2022-04-02 ENCOUNTER — Other Ambulatory Visit: Payer: Self-pay | Admitting: Cardiology

## 2022-04-02 ENCOUNTER — Telehealth: Payer: Self-pay | Admitting: Cardiology

## 2022-04-02 NOTE — Telephone Encounter (Signed)
*  STAT* If patient is at the pharmacy, call can be transferred to refill team.   1. Which medications need to be refilled? (please list name of each medication and dose if known) sacubitril-valsartan (ENTRESTO) 97-103 MG   2. Which pharmacy/location (including street and city if local pharmacy) is medication to be sent to?  [id number] [place],  - [address]   3. Do they need a 30 day or 90 day supply? [address]

## 2022-04-02 NOTE — Telephone Encounter (Signed)
Medication request sent to pharmacy. Patient notified and verbalized understanding.

## 2022-04-04 DIAGNOSIS — R7309 Other abnormal glucose: Secondary | ICD-10-CM | POA: Diagnosis not present

## 2022-04-18 DIAGNOSIS — Z79899 Other long term (current) drug therapy: Secondary | ICD-10-CM | POA: Diagnosis not present

## 2022-04-19 ENCOUNTER — Inpatient Hospital Stay: Payer: Medicare HMO | Attending: Hematology

## 2022-04-19 DIAGNOSIS — M069 Rheumatoid arthritis, unspecified: Secondary | ICD-10-CM | POA: Diagnosis not present

## 2022-04-19 DIAGNOSIS — E538 Deficiency of other specified B group vitamins: Secondary | ICD-10-CM | POA: Diagnosis not present

## 2022-04-19 DIAGNOSIS — E559 Vitamin D deficiency, unspecified: Secondary | ICD-10-CM | POA: Diagnosis not present

## 2022-04-19 DIAGNOSIS — D696 Thrombocytopenia, unspecified: Secondary | ICD-10-CM | POA: Insufficient documentation

## 2022-04-19 LAB — CBC WITH DIFFERENTIAL/PLATELET
Abs Immature Granulocytes: 0 10*3/uL (ref 0.00–0.07)
Basophils Absolute: 0 10*3/uL (ref 0.0–0.1)
Basophils Relative: 1 %
Eosinophils Absolute: 0.2 10*3/uL (ref 0.0–0.5)
Eosinophils Relative: 7 %
HCT: 41.5 % (ref 39.0–52.0)
Hemoglobin: 14.7 g/dL (ref 13.0–17.0)
Immature Granulocytes: 0 %
Lymphocytes Relative: 31 %
Lymphs Abs: 1 10*3/uL (ref 0.7–4.0)
MCH: 32.8 pg (ref 26.0–34.0)
MCHC: 35.4 g/dL (ref 30.0–36.0)
MCV: 92.6 fL (ref 80.0–100.0)
Monocytes Absolute: 0.4 10*3/uL (ref 0.1–1.0)
Monocytes Relative: 12 %
Neutro Abs: 1.6 10*3/uL — ABNORMAL LOW (ref 1.7–7.7)
Neutrophils Relative %: 49 %
Platelets: 108 10*3/uL — ABNORMAL LOW (ref 150–400)
RBC: 4.48 MIL/uL (ref 4.22–5.81)
RDW: 12.6 % (ref 11.5–15.5)
WBC: 3.2 10*3/uL — ABNORMAL LOW (ref 4.0–10.5)
nRBC: 0 % (ref 0.0–0.2)

## 2022-04-26 ENCOUNTER — Inpatient Hospital Stay (HOSPITAL_BASED_OUTPATIENT_CLINIC_OR_DEPARTMENT_OTHER): Payer: Medicare HMO | Admitting: Physician Assistant

## 2022-04-26 DIAGNOSIS — D696 Thrombocytopenia, unspecified: Secondary | ICD-10-CM

## 2022-04-26 NOTE — Progress Notes (Signed)
VIRTUAL VISIT via [place]   I connected with Brendan [name]  on 04/26/2022 at 11:30 AM by telephone and verified that I am speaking with the correct person using two identifiers.  Location: Patient: Home Provider: [facility]   I discussed the limitations, risks, security and privacy concerns of performing an evaluation and management service by telephone and the availability of in person appointments. I also discussed with the patient that there may be a patient responsible charge related to this service. The patient expressed understanding and agreed to proceed.  REASON FOR VISIT: Thrombocytopenia  PRIOR THERAPY: None   CURRENT THERAPY: Surveillance  INTERVAL HISTORY:  Brendan [name] is contacted today for follow-up of thrombocytopenia.  He was last seen by Tarri Abernethy PA-C on 10/23/2021.  At today's visit, he reports feeling fairly well.  No recent hospitalizations, surgeries, or changes in baseline health status. He admits to easy bruising.  He denies any petechial rash.  No abnormal bleeding events.   He has had occasional night sweats for the past several years.  Denies any unintentional weight loss, fevers, or chills.  He continues to take [facility] for rheumatoid arthritis.  He continues to take Vitamin D and vitamin B12 tablets daily.   He has 80% energy and 100% appetite. He endorses that he is maintaining a stable weight.  REVIEW OF SYSTEMS:   Review of Systems  Constitutional:  Positive for diaphoresis (Occasional night sweats). Negative for chills, fever, malaise/fatigue and weight loss.  Respiratory:  Negative for cough and shortness of breath.   Cardiovascular:  Negative for chest pain and palpitations.  Gastrointestinal:  Negative for abdominal pain, blood in stool, melena, nausea and vomiting.  Musculoskeletal:  Positive for joint pain (Rheumatoid arthritis).  Neurological:  Negative for dizziness and  headaches.     PHYSICAL EXAM: (per limitations of virtual telephone visit)  The patient is alert and oriented x 3, exhibiting adequate mentation, good mood, and ability to speak in full sentences and execute sound judgement.  ASSESSMENT & PLAN:  1.  Thrombocytopenia and leukopenia, mild to moderate - Mild to moderate thrombocytopenia since December 2019 - Nutritional deficiency work-up was negative - He takes Environmental health practitioner for his rheumatoid arthritis - Abdominal ultrasound (06/22/2019): Hepatic steatosis, normal spleen - Reports easy bruising and occasional nosebleeds - Baseline ecchymosis of upper extremities is stable.   - Physical examination did not reveal any palpable splenomegaly or lymphadenopathy at last office visit (September 2023) - Most recent labs (04/19/2022): Platelets 108, WBC 3.2): Platelets 102, WBC 3.2 with ANC 1.5.  CMP unremarkable apart from mildly elevated total bilirubin 1.3.  LDH normal. - Differential diagnosis includes medication induced thrombocytopenia/leukopenia (from [place]) versus immune mediated process - PLAN: Blood counts overall stable but due to persistent neutropenia we will recheck labs in 6 months rather than annually. - Repeat CBC with RTC in 6 months via OFFICE visit. - Would consider bone marrow biopsy if any major deviations from baseline   2.  Vitamin B12 deficiency - He is taking daily B12 supplement, unknown dose - Labs (10/16/2021): Vitamin B12 normal 596, normal MMA - PLAN: Continue vitamin B12 supplement.  Recheck levels annually.   3.  Vitamin D deficiency - He is taking daily vitamin D supplement, unknown dose - Labs (10/16/2021): Vitamin D49.86, normal - PLAN: Continue vitamin D supplement.  Recheck levels annually.   4.  Rheumatoid arthritis - This affects all of his joints but predominantly upper  part of the body.  He was treated with various agents. - This is best controlled with Darcus Pester for the last 3 years.    PLAN SUMMARY: >>  Labs in 6 months = CBC/D, CMP, LDH, B12, MMA, vitamin D >> OFFICE visit in 6 months (1 week after labs)     I discussed the assessment and treatment plan with the patient. The patient was provided an opportunity to ask questions and all were answered. The patient agreed with the plan and demonstrated an understanding of the instructions.   The patient was advised to call back or seek an in-person evaluation if the symptoms worsen or if the condition fails to improve as anticipated.  I provided 18 minutes of non-face-to-face time during this encounter.  Harriett Rush, PA-C 04/26/22  11:49 AM

## 2022-04-29 ENCOUNTER — Other Ambulatory Visit: Payer: Self-pay

## 2022-04-29 DIAGNOSIS — D696 Thrombocytopenia, unspecified: Secondary | ICD-10-CM

## 2022-05-01 DIAGNOSIS — Z5181 Encounter for therapeutic drug level monitoring: Secondary | ICD-10-CM | POA: Diagnosis not present

## 2022-05-01 DIAGNOSIS — M05711 Rheumatoid arthritis with rheumatoid factor of right shoulder without organ or systems involvement: Secondary | ICD-10-CM | POA: Diagnosis not present

## 2022-05-01 DIAGNOSIS — D696 Thrombocytopenia, unspecified: Secondary | ICD-10-CM | POA: Diagnosis not present

## 2022-05-01 DIAGNOSIS — M1990 Unspecified osteoarthritis, unspecified site: Secondary | ICD-10-CM | POA: Diagnosis not present

## 2022-05-01 DIAGNOSIS — Z79899 Other long term (current) drug therapy: Secondary | ICD-10-CM | POA: Diagnosis not present

## 2022-05-01 DIAGNOSIS — M057A Rheumatoid arthritis with rheumatoid factor of other specified site without organ or systems involvement: Secondary | ICD-10-CM | POA: Diagnosis not present

## 2022-05-06 DIAGNOSIS — Z5181 Encounter for therapeutic drug level monitoring: Secondary | ICD-10-CM | POA: Diagnosis not present

## 2022-05-06 DIAGNOSIS — D696 Thrombocytopenia, unspecified: Secondary | ICD-10-CM | POA: Diagnosis not present

## 2022-05-06 DIAGNOSIS — M05711 Rheumatoid arthritis with rheumatoid factor of right shoulder without organ or systems involvement: Secondary | ICD-10-CM | POA: Diagnosis not present

## 2022-05-06 DIAGNOSIS — Z79899 Other long term (current) drug therapy: Secondary | ICD-10-CM | POA: Diagnosis not present

## 2022-05-09 ENCOUNTER — Encounter: Payer: Self-pay | Admitting: Internal Medicine

## 2022-06-16 ENCOUNTER — Other Ambulatory Visit: Payer: Self-pay | Admitting: Cardiology

## 2022-06-21 ENCOUNTER — Ambulatory Visit: Payer: Medicare HMO | Attending: Cardiology | Admitting: Cardiology

## 2022-06-21 ENCOUNTER — Encounter: Payer: Self-pay | Admitting: Cardiology

## 2022-06-21 VITALS — BP 132/64 | HR 58 | Ht 72.0 in | Wt 239.0 lb

## 2022-06-21 DIAGNOSIS — I6523 Occlusion and stenosis of bilateral carotid arteries: Secondary | ICD-10-CM

## 2022-06-21 DIAGNOSIS — I5032 Chronic diastolic (congestive) heart failure: Secondary | ICD-10-CM | POA: Diagnosis not present

## 2022-06-21 DIAGNOSIS — E782 Mixed hyperlipidemia: Secondary | ICD-10-CM | POA: Diagnosis not present

## 2022-06-21 DIAGNOSIS — I719 Aortic aneurysm of unspecified site, without rupture: Secondary | ICD-10-CM
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
## 2022-06-21 NOTE — Patient Instructions (Signed)
Medication Instructions:  Your physician recommends that you continue on your current medications as directed. Please refer to the Current Medication list given to you today.  *If you need a refill on your cardiac medications before your next appointment, please call your pharmacy*   Lab Work: BMP  If you have labs (blood work) drawn today and your tests are completely normal, you will receive your results only by: MyChart Message (if you have MyChart) OR A paper copy in the mail If you have any lab test that is abnormal or we need to change your treatment, we will call you to review the results.   Testing/Procedures: CTA CHEST/Aorta   Follow-Up: At [facility], you and your health needs are our priority.  As part of our continuing mission to provide you with exceptional heart care, we have created designated Provider Care Teams.  These Care Teams include your primary Cardiologist (physician) and Advanced Practice Providers (APPs -  Physician Assistants and Nurse Practitioners) who all work together to provide you with the care you need, when you need it.  We recommend signing up for the patient portal called "MyChart".  Sign up information is provided on this After Visit Summary.  MyChart is used to connect with patients for Virtual Visits (Telemedicine).  Patients are able to view lab/test results, encounter notes, upcoming appointments, etc.  Non-urgent messages can be sent to your provider as well.   To learn more about what you can do with MyChart, go to [email or web address].    Your next appointment:   6 months  Provider:   Dina Rich, MD    Other Instructions

## 2022-06-26 DIAGNOSIS — I719 Aortic aneurysm of unspecified site, without rupture: Secondary | ICD-10-CM | POA: Diagnosis not present

## 2022-06-27 LAB — BASIC METABOLIC PANEL
BUN/Creatinine Ratio: 16 (ref 10–24)
BUN: 14 mg/dL (ref 8–27)
CO2: 26 mmol/L (ref 20–29)
Calcium: 9.1 mg/dL (ref 8.6–10.2)
Chloride: 103 mmol/L (ref 96–106)
Creatinine, Ser: 0.88 mg/dL (ref 0.76–1.27)
Glucose: 114 mg/dL — ABNORMAL HIGH (ref 70–99)
Potassium: 4.2 mmol/L (ref 3.5–5.2)
Sodium: 140 mmol/L (ref 134–144)
eGFR: 96 mL/min/{1.73_m2} (ref >59)

## 2022-07-02 ENCOUNTER — Other Ambulatory Visit: Payer: Self-pay | Admitting: Cardiology

## 2022-07-09 ENCOUNTER — Telehealth: Payer: Self-pay

## 2022-07-09 NOTE — Telephone Encounter (Signed)
-----   Message from Antoine Poche, MD sent at 07/08/2022 11:04 AM EDT ----- Normal labs  Dominga Ferry MD

## 2022-07-09 NOTE — Telephone Encounter (Signed)
Patient notified and verbalized understanding. Patient had no questions or concerns at this time. PCP copied 

## 2022-08-02 ENCOUNTER — Telehealth: Payer: Self-pay | Admitting: Cardiology

## 2022-08-02 MED ORDER — ENTRESTO 97-103 MG PO TABS: 1.0000 | ORAL_TABLET | Freq: Two times a day (BID) | ORAL | 6 refills | Status: DC

## 2022-08-02 NOTE — Telephone Encounter (Signed)
*  STAT* If patient is at the pharmacy, call can be transferred to refill team.   1. Which medications need to be refilled? (please list name of each medication and dose if known)  ENTRESTO 97-103 MG   2. Which pharmacy/location (including street and city if local pharmacy) is medication to be sent to? [id number] - [place], [place] - [address] AT [address]   3. Do they need a 30 day or 90 day supply? 30

## 2022-08-02 NOTE — Telephone Encounter (Signed)
Refilled as requested  

## 2022-08-19 ENCOUNTER — Ambulatory Visit (HOSPITAL_COMMUNITY)
Admission: RE | Admit: 2022-08-19 | Discharge: 2022-08-19 | Disposition: A | Discharge status: Home / Self Care | Payer: Medicare HMO | Source: Ambulatory Visit | Attending: Cardiology | Admitting: Cardiology

## 2022-08-19 ENCOUNTER — Telehealth: Payer: Self-pay | Admitting: *Deleted

## 2022-08-19 DIAGNOSIS — K760 Fatty (change of) liver, not elsewhere classified: Secondary | ICD-10-CM

## 2022-08-19 DIAGNOSIS — I700 Atherosclerosis of aorta: Secondary | ICD-10-CM | POA: Diagnosis not present

## 2022-08-19 DIAGNOSIS — I2510 Atherosclerotic heart disease of native coronary artery without angina pectoris: Secondary | ICD-10-CM | POA: Diagnosis not present

## 2022-08-19 DIAGNOSIS — I719 Aortic aneurysm of unspecified site, without rupture: Secondary | ICD-10-CM | POA: Diagnosis not present

## 2022-08-19 DIAGNOSIS — R918 Other nonspecific abnormal finding of lung field: Secondary | ICD-10-CM | POA: Diagnosis not present

## 2022-08-19 DIAGNOSIS — D696 Thrombocytopenia, unspecified: Secondary | ICD-10-CM

## 2022-08-19 MED ORDER — IOHEXOL 350 MG/ML SOLN
80.0000 mL | Freq: Once | INTRAVENOUS | Status: AC | PRN
  Administered 2022-08-19: 80 mL via INTRAVENOUS

## 2022-08-19 NOTE — Telephone Encounter (Signed)
Pt called and left vm stating that he received a letter about scheduling an ultrasound.  Advised pt that [place] is scheduled for 08/30/22, arrive at 8:15 am to [facility]. Nothing to eat or drink after midnight.

## 2022-08-27 ENCOUNTER — Telehealth: Payer: Self-pay | Admitting: Cardiology

## 2022-08-27 NOTE — Telephone Encounter (Signed)
CT looks good, aorta is measured at the upper limits of normal without any significant increase in size. We will continue to monitor   Dominga Ferry MD    Patient notified

## 2022-08-27 NOTE — Telephone Encounter (Signed)
Patient is returning call in regards to results. Transferred to Santina Evans, Charity fundraiser.

## 2022-08-28 ENCOUNTER — Other Ambulatory Visit: Payer: Self-pay | Admitting: Cardiology

## 2022-08-30 ENCOUNTER — Ambulatory Visit (HOSPITAL_COMMUNITY): Admission: RE | Admit: 2022-08-30 | Payer: Medicare HMO | Source: Ambulatory Visit

## 2022-08-30 DIAGNOSIS — K760 Fatty (change of) liver, not elsewhere classified: Secondary | ICD-10-CM | POA: Diagnosis not present

## 2022-08-30 DIAGNOSIS — D696 Thrombocytopenia, unspecified: Secondary | ICD-10-CM | POA: Diagnosis not present

## 2022-08-30 DIAGNOSIS — I714 Abdominal aortic aneurysm, without rupture, unspecified: Secondary | ICD-10-CM | POA: Diagnosis not present

## 2022-09-04 ENCOUNTER — Other Ambulatory Visit: Payer: Self-pay | Admitting: Gastroenterology

## 2022-10-24 ENCOUNTER — Inpatient Hospital Stay: Payer: Medicare HMO | Attending: Hematology

## 2022-10-24 DIAGNOSIS — E559 Vitamin D deficiency, unspecified: Secondary | ICD-10-CM | POA: Diagnosis not present

## 2022-10-24 DIAGNOSIS — D696 Thrombocytopenia, unspecified: Secondary | ICD-10-CM | POA: Insufficient documentation

## 2022-10-24 LAB — COMPREHENSIVE METABOLIC PANEL
ALT: 38 U/L (ref 0–44)
AST: 24 U/L (ref 15–41)
Albumin: 3.8 g/dL (ref 3.5–5.0)
Alkaline Phosphatase: 35 U/L — ABNORMAL LOW (ref 38–126)
Anion gap: 8 (ref 5–15)
BUN: 13 mg/dL (ref 8–23)
CO2: 26 mmol/L (ref 22–32)
Calcium: 9 mg/dL (ref 8.9–10.3)
Chloride: 104 mmol/L (ref 98–111)
Creatinine, Ser: 0.88 mg/dL (ref 0.61–1.24)
GFR, Estimated: >60 mL/min (ref >60)
Glucose, Bld: 99 mg/dL (ref 70–99)
Potassium: 4.1 mmol/L (ref 3.5–5.1)
Sodium: 138 mmol/L (ref 135–145)
Total Bilirubin: 1.3 mg/dL — ABNORMAL HIGH (ref 0.3–1.2)
Total Protein: 6.2 g/dL — ABNORMAL LOW (ref 6.5–8.1)

## 2022-10-24 LAB — CBC WITH DIFFERENTIAL/PLATELET
Abs Immature Granulocytes: 0 10*3/uL (ref 0.00–0.07)
Basophils Absolute: 0 10*3/uL (ref 0.0–0.1)
Basophils Relative: 1 %
Eosinophils Absolute: 0.2 10*3/uL (ref 0.0–0.5)
Eosinophils Relative: 6 %
HCT: 42 % (ref 39.0–52.0)
Hemoglobin: 14.9 g/dL (ref 13.0–17.0)
Immature Granulocytes: 0 %
Lymphocytes Relative: 32 %
Lymphs Abs: 1.1 10*3/uL (ref 0.7–4.0)
MCH: 32.5 pg (ref 26.0–34.0)
MCHC: 35.5 g/dL (ref 30.0–36.0)
MCV: 91.7 fL (ref 80.0–100.0)
Monocytes Absolute: 0.5 10*3/uL (ref 0.1–1.0)
Monocytes Relative: 15 %
Neutro Abs: 1.5 10*3/uL — ABNORMAL LOW (ref 1.7–7.7)
Neutrophils Relative %: 46 %
Platelets: 107 10*3/uL — ABNORMAL LOW (ref 150–400)
RBC: 4.58 MIL/uL (ref 4.22–5.81)
RDW: 12.7 % (ref 11.5–15.5)
WBC: 3.3 10*3/uL — ABNORMAL LOW (ref 4.0–10.5)
nRBC: 0 % (ref 0.0–0.2)

## 2022-10-24 LAB — LACTATE DEHYDROGENASE: LDH: 125 U/L (ref 98–192)

## 2022-10-24 LAB — VITAMIN D 25 HYDROXY (VIT D DEFICIENCY, FRACTURES): Vit D, 25-Hydroxy: 58.6 ng/mL (ref 30–100)

## 2022-10-24 LAB — VITAMIN B12: Vitamin B-12: 825 pg/mL (ref 180–914)

## 2022-10-28 LAB — METHYLMALONIC ACID, SERUM: Methylmalonic Acid, Quantitative: 267 nmol/L (ref 0–378)

## 2022-10-31 ENCOUNTER — Inpatient Hospital Stay (HOSPITAL_BASED_OUTPATIENT_CLINIC_OR_DEPARTMENT_OTHER): Payer: Medicare HMO | Admitting: Oncology

## 2022-10-31 ENCOUNTER — Encounter: Payer: Self-pay | Admitting: Oncology

## 2022-10-31 ENCOUNTER — Inpatient Hospital Stay: Payer: Medicare HMO | Admitting: Oncology

## 2022-10-31 DIAGNOSIS — D696 Thrombocytopenia, unspecified: Secondary | ICD-10-CM | POA: Diagnosis not present

## 2022-10-31 DIAGNOSIS — M057A Rheumatoid arthritis with rheumatoid factor of other specified site without organ or systems involvement: Secondary | ICD-10-CM | POA: Diagnosis not present

## 2022-10-31 DIAGNOSIS — M2550 Pain in unspecified joint: Secondary | ICD-10-CM | POA: Diagnosis not present

## 2022-10-31 NOTE — Progress Notes (Signed)
VIRTUAL VISIT via TELEPHONE NOTE [facility]   I connected with Brendan [name]  on 10/31/22 at 2:00 PM by telephone and verified that I am speaking with the correct person using two identifiers.  Location: Patient: Home Provider: [facility]   I discussed the limitations, risks, security and privacy concerns of performing an evaluation and management service by telephone and the availability of in person appointments. I also discussed with the patient that there may be a patient responsible charge related to this service. The patient expressed understanding and agreed to proceed.  REASON FOR VISIT: Thrombocytopenia  PRIOR THERAPY: None   CURRENT THERAPY: Surveillance  INTERVAL HISTORY:  Brendan [name] is contacted today for follow-up of thrombocytopenia.  He was last contacted by Rojelio Brenner on 04/26/2022.  At today's visit, she reports feeling well.  Appetite and energy levels are 100%.    Reports being compliant with vitamin D and B12 oral supplements.  Reports 10 out of 10 rheumatoid pain that changes daily.  He denies any recent hospitalizations, surgeries or changes in baseline health.  He admits to easy bruising.  He denies any petechial rash.  No abnormal bleeding events.   He has had occasional night sweats for the past several years.  Denies any unintentional weight loss, fevers, or chills.  He continues to take [facility] for rheumatoid arthritis.     REVIEW OF SYSTEMS:   Review of Systems  Musculoskeletal:  Positive for joint pain.     PHYSICAL EXAM: (per limitations of virtual telephone visit)  The patient is alert and oriented x 3, exhibiting adequate mentation, good mood, and ability to speak in full sentences and execute sound judgement.  ASSESSMENT  1.  Thrombocytopenia and leukopenia, mild to moderate - Mild to moderate thrombocytopenia since December 2019 - Nutritional deficiency work-up was negative - He takes Biochemist, clinical  for his rheumatoid arthritis - Abdominal ultrasound (06/22/2019): Hepatic steatosis, normal spleen  PLAN: - Most recent labs from 10/24/2022 show a platelet count of 107, WBC 3.3 with an ANC of 1.5.  CMP unremarkable except for slightly elevated total bili at 1.3.  LDH is normal. - Differential diagnosis includes medication induced thrombocytopenia/leukopenia (from [place]) versus immune mediated process -Discussed bone marrow biopsy if any major deviations from baseline. -Return to clinic in 6 months with follow-up and labs a few days before.   2.  Vitamin B12 deficiency - He is taking daily B12 supplement, unknown dose - Labs from 10/24/2022 show a B12 level of 825 with an MMA of 267. -Continue B12 supplement.  3.  Vitamin D deficiency - He is taking daily vitamin D supplement, unknown dose - Labs from 10/24/2022 show a vitamin D level of 58.60. -Recommend continuing vitamin D.   4.  Rheumatoid arthritis - This affects all of his joints but predominantly upper part of the body.  He was treated with various agents. - This is best controlled with Carlis Abbott for the last 3 years.    PLAN SUMMARY: >> Return to clinic in 6 months with labs a few days before and see NP/PA.     I discussed the assessment and treatment plan with the patient. The patient was provided an opportunity to ask questions and all were answered. The patient agreed with the plan and demonstrated an understanding of the instructions.   The patient was advised to call back or seek an in-person evaluation if the symptoms worsen or if the condition fails to improve  as anticipated.  I provided 18 minutes of non face-to-face telephone visit time during this encounter, and > 50% was spent counseling as documented under my assessment & plan.   Durenda Hurt, NP 11/06/2022 1:32 PM

## 2022-11-06 DIAGNOSIS — Z79899 Other long term (current) drug therapy: Secondary | ICD-10-CM | POA: Diagnosis not present

## 2022-11-06 DIAGNOSIS — M057A Rheumatoid arthritis with rheumatoid factor of other specified site without organ or systems involvement: Secondary | ICD-10-CM | POA: Diagnosis not present

## 2022-11-18 DIAGNOSIS — Z79899 Other long term (current) drug therapy: Secondary | ICD-10-CM | POA: Diagnosis not present

## 2022-11-20 DIAGNOSIS — B9689 Other specified bacterial agents as the cause of diseases classified elsewhere: Secondary | ICD-10-CM | POA: Diagnosis not present

## 2022-11-20 DIAGNOSIS — L570 Actinic keratosis: Secondary | ICD-10-CM | POA: Diagnosis not present

## 2022-11-20 DIAGNOSIS — X32XXXD Exposure to sunlight, subsequent encounter: Secondary | ICD-10-CM | POA: Diagnosis not present

## 2022-11-20 DIAGNOSIS — L218 Other seborrheic dermatitis: Secondary | ICD-10-CM | POA: Diagnosis not present

## 2022-11-20 DIAGNOSIS — L02821 Furuncle of head [any part, except face]: Secondary | ICD-10-CM | POA: Diagnosis not present

## 2022-11-27 DIAGNOSIS — Z23 Encounter for immunization: Secondary | ICD-10-CM | POA: Diagnosis not present

## 2022-12-02 ENCOUNTER — Telehealth: Payer: Self-pay | Admitting: Cardiology

## 2022-12-02 MED ORDER — ENTRESTO 97-103 MG PO TABS: 1.0000 | ORAL_TABLET | Freq: Two times a day (BID) | ORAL | 6 refills | Status: DC

## 2022-12-02 NOTE — Telephone Encounter (Signed)
 *  STAT* If patient is at the pharmacy, call can be transferred to refill team.   1. Which medications need to be refilled? (please list name of each medication and dose if known)   sacubitril-valsartan (ENTRESTO) 97-103 MG   2. Which pharmacy/location (including street and city if local pharmacy) is medication to be sent to?  [id number] - [place], [place] - [address] AT [address]    3. Do they need a 30 day or 90 day supply?  30 day.  Patient has 1 dose left and then he will be out.

## 2022-12-03 ENCOUNTER — Ambulatory Visit: Payer: Medicare HMO | Admitting: Gastroenterology

## 2022-12-03 ENCOUNTER — Telehealth: Payer: Self-pay | Admitting: Cardiology

## 2022-12-03 ENCOUNTER — Encounter: Payer: Self-pay | Admitting: Gastroenterology

## 2022-12-03 VITALS — BP 116/63 | HR 61 | Temp 98.1°F | Ht 72.0 in | Wt 239.0 lb

## 2022-12-03 DIAGNOSIS — K219 Gastro-esophageal reflux disease without esophagitis: Secondary | ICD-10-CM | POA: Diagnosis not present

## 2022-12-03 DIAGNOSIS — R7989 Other specified abnormal findings of blood chemistry: Secondary | ICD-10-CM | POA: Diagnosis not present

## 2022-12-03 DIAGNOSIS — K760 Fatty (change of) liver, not elsewhere classified: Secondary | ICD-10-CM | POA: Diagnosis not present

## 2022-12-03 NOTE — Telephone Encounter (Signed)
I spoke with pharmacy and they have medication for patient, they ask that patient pick up at 4:30 pm   Patient notified

## 2022-12-03 NOTE — Patient Instructions (Signed)
Continue pantoprazole 40mg  daily before breakfast for acid reflux. We will remind you in six months to have the special ultrasound to check for liver fibrosis and to update your labs.  Discuss abdominal aorta aneurysm with Dr. Wyline Mood at your next visit with him. He may be willing to follow along with the thoracic aneurysm. Otherwise we will make sure you are reminded in 3 years to have another ultrasound. Return office visit in 06/2023.   Instructions for fatty liver: Recommend 1-2# weight loss per week until ideal body weight through exercise & diet. Low fat/cholesterol diet.   Avoid sweets, sodas, fruit juices, sweetened beverages like tea, etc. Gradually increase exercise from 15 min daily up to 1 hr per day 5 days/week. Limit alcohol use.

## 2022-12-03 NOTE — Progress Notes (Signed)
GI Office Note    Referring Provider: Elfredia Nevins, MD Primary Care Physician:  Elfredia Nevins, MD  Primary Gastroenterologist: Roetta Sessions, MD   Chief Complaint   Chief Complaint  Patient presents with   Follow-up    Doing well, no issues    History of Present Illness   Brendan [name] is a 67 y.o. male presenting today for follow-up.  Last seen November 2023.  He has a history of GERD, elevated LFTs, fatty liver (noted on ultrasound in 2021).  Doing well. Heartburn well controlled on pantoprazole once daily. Rare solid food dysphagia. BM regular. No melena, brbpr. No abdominal pain. Having more issues with RA flare. Carlis Abbott not working, worked well for six years. Anticipates change in medication.  Dr. Wyline Mood following him for ascending thoracic aortic ectasia, last CT 08/2022. Patient states he is unaware of the abdominal aortic aneurysm found by abd u/s since that time.  Abdominal ultrasound July 2024: Fatty infiltration of the liver.  Proximal abdominal aortic aneurysm measuring 3 cm recommending ultrasound every 3 years.  EGD 12/2018: moderately severe erosive reflux esophagitis with peptic stricture s/p dilation, medium sized hh, gastric and duodenal erosions. Gastric bx with gastritis but no h.pylori.    Colonoscopy 2016, hyperplastic polyp removed. Diverticulosis. Next colonoscopy in 2026.   Medications   Current Outpatient Medications  Medication Sig Dispense Refill   ASPIRIN 81 81 MG chewable tablet SMARTSIG:By Mouth     atorvastatin (LIPITOR) 10 MG tablet TAKE 1 TABLET BY MOUTH EVERY DAY 90 tablet 3   carvedilol (COREG) 3.125 MG tablet TAKE 1 TABLET BY MOUTH EVERY MORNING AND TAKE 2 TABLETS EVERY EVENING 270 tablet 3   clobetasol (TEMOVATE) 0.05 % external solution Apply 1 application topically 2 (two) times daily as needed (to affected areas of scalp).      Coenzyme Q10 (Q-10 CO-ENZYME PO) Take 1 tablet by mouth daily.     Cyanocobalamin (VITAMIN B 12  PO) Take 1 tablet by mouth daily.     HYDROcodone-acetaminophen (NORCO/VICODIN) 5-325 MG tablet Take 1 tablet by mouth every 12 (twelve) hours as needed for moderate pain.     KEVZARA 200 MG/1. SOAJ Inject 200 mg into the skin every 14 (fourteen) days.     levothyroxine (SYNTHROID) 50 MCG tablet Take 50 mcg by mouth daily before breakfast.     MEGARED OMEGA-3 KRILL OIL 500 MG CAPS Take 1 tablet by mouth daily.     nitroGLYCERIN (NITROSTAT) 0.4 MG SL tablet Place 1 tablet (0.4 mg total) under the tongue every 5 (five) minutes x 3 doses as needed for chest pain. 25 tablet 0   pantoprazole (PROTONIX) 40 MG tablet TAKE 1 TABLET BY MOUTH EVERY DAY 90 tablet 3   predniSONE (DELTASONE) 10 MG tablet Take by mouth.     sacubitril-valsartan (ENTRESTO) 97-103 MG Take 1 tablet by mouth 2 (two) times daily. 60 tablet 6   VITAMIN D PO Take 1 tablet by mouth daily.     No current facility-administered medications for this visit.    Allergies   Allergies as of 12/03/2022 - Review Complete 12/03/2022  Allergen Reaction Noted   Simponi [golimumab]  01/15/2018      Review of Systems   General: Negative for anorexia, weight loss, fever, chills, fatigue, weakness. ENT: Negative for hoarseness, difficulty swallowing , nasal congestion. CV: Negative for chest pain, angina, palpitations, dyspnea on exertion, peripheral edema.  Respiratory: Negative for dyspnea at rest, dyspnea on exertion, cough, sputum, wheezing.  GI: See history of present illness. GU:  Negative for dysuria, hematuria, urinary incontinence, urinary frequency, nocturnal urination.  Endo: Negative for unusual weight change.     Physical Exam   BP 116/63 (BP Location: Right Arm, Patient Position: Sitting, Cuff Size: Large)   Pulse 61   Temp 98.1 F (36.7 C) (Oral)   Ht 6' (1.829 m)   Wt 239 lb (108.4 kg)   SpO2 96%   BMI 32.41 kg/m    General: Well-nourished, well-developed in no acute distress.  Eyes: No icterus. Mouth:  Oropharyngeal mucosa moist and pink   Abdomen: Bowel sounds are normal, nontender, nondistended, no hepatosplenomegaly or masses,  no abdominal bruits or hernia , no rebound or guarding.  Rectal: not performed  Extremities: No lower extremity edema. No clubbing or deformities. Neuro: Alert and oriented x 4   Skin: Warm and dry, no jaundice.   Psych: Alert and cooperative, normal mood and affect.  Labs   Lab Results  Component Value Date   NA 138 10/24/2022   CL 104 10/24/2022   K 4.1 10/24/2022   CO2 26 10/24/2022   BUN 13 10/24/2022   CREATININE 0.88 10/24/2022   GFRNONAA >60 10/24/2022   CALCIUM 9.0 10/24/2022   ALBUMIN 3.8 10/24/2022   GLUCOSE 99 10/24/2022   Lab Results  Component Value Date   ALT 38 10/24/2022   AST 24 10/24/2022   ALKPHOS 35 (L) 10/24/2022   BILITOT 1.3 (H) 10/24/2022   Lab Results  Component Value Date   WBC 3.3 (L) 10/24/2022   HGB 14.9 10/24/2022   HCT 42.0 10/24/2022   MCV 91.7 10/24/2022   PLT 107 (L) 10/24/2022   Lab Results  Component Value Date   VITAMINB12 825 10/24/2022    Imaging Studies   No results found.  Assessment/Plan:   GERD: -doing well on pantoprazole 40mg  daily -reinforced antireflux measures  Chronic intermittent elevated LFTs/fatty liver: -NAFLD score indeterminate -update labs and elastography in six months. Will check CBC, CMET, PT/INR ANA, AMA, ASMA, IgG/IgA/IgM, HCV Ab, iron/tibc/ferritin, NASH Fibrosure, TTG IaA. -return ov after labs/elastography  AAA: -recommend he have cardiology follow but we have marked to remind him of u/s in 3 years to be sure surveillance is not missed.          Leanna Battles. Melvyn Neth, MHS, PA-C [facility]

## 2022-12-03 NOTE — Telephone Encounter (Signed)
Patient called in refill for Entresto he was told they would let him know when they had it ready. He don't believe they have it in stock. Can he get a couple refills.  Please advise

## 2022-12-16 DIAGNOSIS — M057A Rheumatoid arthritis with rheumatoid factor of other specified site without organ or systems involvement: Secondary | ICD-10-CM | POA: Diagnosis not present

## 2022-12-16 DIAGNOSIS — Z79899 Other long term (current) drug therapy: Secondary | ICD-10-CM | POA: Diagnosis not present

## 2022-12-19 DIAGNOSIS — Z79899 Other long term (current) drug therapy: Secondary | ICD-10-CM | POA: Diagnosis not present

## 2022-12-19 DIAGNOSIS — M057A Rheumatoid arthritis with rheumatoid factor of other specified site without organ or systems involvement: Secondary | ICD-10-CM | POA: Diagnosis not present

## 2022-12-20 ENCOUNTER — Encounter: Payer: Self-pay | Admitting: Cardiology

## 2022-12-20 ENCOUNTER — Ambulatory Visit: Payer: Medicare HMO | Attending: Cardiology | Admitting: Cardiology

## 2022-12-20 VITALS — BP 116/68 | HR 58 | Ht 72.0 in | Wt 237.0 lb

## 2022-12-20 DIAGNOSIS — I719 Aortic aneurysm of unspecified site, without rupture: Secondary | ICD-10-CM | POA: Diagnosis not present

## 2022-12-20 DIAGNOSIS — E782 Mixed hyperlipidemia: Secondary | ICD-10-CM | POA: Diagnosis not present

## 2022-12-20 DIAGNOSIS — I5032 Chronic diastolic (congestive) heart failure: Secondary | ICD-10-CM | POA: Diagnosis not present

## 2022-12-20 NOTE — Patient Instructions (Signed)
Medication Instructions:  Your physician recommends that you continue on your current medications as directed. Please refer to the Current Medication list given to you today.  *If you need a refill on your cardiac medications before your next appointment, please call your pharmacy*   Lab Work: None  If you have labs (blood work) drawn today and your tests are completely normal, you will receive your results only by: MyChart Message (if you have MyChart) OR A paper copy in the mail If you have any lab test that is abnormal or we need to change your treatment, we will call you to review the results.   Testing/Procedures: None   Follow-Up: At [facility], you and your health needs are our priority.  As part of our continuing mission to provide you with exceptional heart care, we have created designated Provider Care Teams.  These Care Teams include your primary Cardiologist (physician) and Advanced Practice Providers (APPs -  Physician Assistants and Nurse Practitioners) who all work together to provide you with the care you need, when you need it.  We recommend signing up for the patient portal called "MyChart".  Sign up information is provided on this After Visit Summary.  MyChart is used to connect with patients for Virtual Visits (Telemedicine).  Patients are able to view lab/test results, encounter notes, upcoming appointments, etc.  Non-urgent messages can be sent to your provider as well.   To learn more about what you can do with MyChart, go to [email or web address].    Your next appointment:   1 year(s)  Provider:   You may see Dina Rich, MD or one of the following Advanced Practice Providers on your designated Care Team:   Randall An, PA-C  Jacolyn Reedy, [place]     Other Instructions
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
## 2023-01-31 DIAGNOSIS — Z23 Encounter for immunization: Secondary | ICD-10-CM | POA: Diagnosis not present

## 2023-01-31 DIAGNOSIS — M069 Rheumatoid arthritis, unspecified: Secondary | ICD-10-CM | POA: Diagnosis not present

## 2023-02-17 DIAGNOSIS — X32XXXD Exposure to sunlight, subsequent encounter: Secondary | ICD-10-CM | POA: Diagnosis not present

## 2023-02-17 DIAGNOSIS — L570 Actinic keratosis: Secondary | ICD-10-CM | POA: Diagnosis not present

## 2023-02-17 DIAGNOSIS — L820 Inflamed seborrheic keratosis: Secondary | ICD-10-CM | POA: Diagnosis not present

## 2023-03-03 DIAGNOSIS — Z23 Encounter for immunization: Secondary | ICD-10-CM | POA: Diagnosis not present

## 2023-03-03 DIAGNOSIS — M069 Rheumatoid arthritis, unspecified: Secondary | ICD-10-CM | POA: Diagnosis not present

## 2023-03-20 DIAGNOSIS — M069 Rheumatoid arthritis, unspecified: Secondary | ICD-10-CM | POA: Diagnosis not present

## 2023-04-23 ENCOUNTER — Encounter: Payer: Self-pay | Admitting: Gastroenterology

## 2023-04-24 ENCOUNTER — Other Ambulatory Visit: Payer: Self-pay

## 2023-04-24 DIAGNOSIS — D696 Thrombocytopenia, unspecified: Secondary | ICD-10-CM

## 2023-04-25 ENCOUNTER — Inpatient Hospital Stay: Payer: Medicare HMO | Attending: Hematology

## 2023-04-25 DIAGNOSIS — D72819 Decreased white blood cell count, unspecified: Secondary | ICD-10-CM | POA: Diagnosis not present

## 2023-04-25 DIAGNOSIS — M069 Rheumatoid arthritis, unspecified: Secondary | ICD-10-CM | POA: Insufficient documentation

## 2023-04-25 DIAGNOSIS — E538 Deficiency of other specified B group vitamins: Secondary | ICD-10-CM | POA: Insufficient documentation

## 2023-04-25 DIAGNOSIS — E559 Vitamin D deficiency, unspecified: Secondary | ICD-10-CM | POA: Insufficient documentation

## 2023-04-25 DIAGNOSIS — D696 Thrombocytopenia, unspecified: Secondary | ICD-10-CM | POA: Insufficient documentation

## 2023-04-25 LAB — COMPREHENSIVE METABOLIC PANEL
ALT: 32 U/L (ref 0–44)
AST: 18 U/L (ref 15–41)
Albumin: 3.5 g/dL (ref 3.5–5.0)
Alkaline Phosphatase: 32 U/L — ABNORMAL LOW (ref 38–126)
Anion gap: 8 (ref 5–15)
BUN: 17 mg/dL (ref 8–23)
CO2: 28 mmol/L (ref 22–32)
Calcium: 9 mg/dL (ref 8.9–10.3)
Chloride: 103 mmol/L (ref 98–111)
Creatinine, Ser: 1.03 mg/dL (ref 0.61–1.24)
GFR, Estimated: >60 mL/min (ref >60)
Glucose, Bld: 152 mg/dL — ABNORMAL HIGH (ref 70–99)
Potassium: 3.6 mmol/L (ref 3.5–5.1)
Sodium: 139 mmol/L (ref 135–145)
Total Bilirubin: 1.2 mg/dL (ref 0.0–1.2)
Total Protein: 6 g/dL — ABNORMAL LOW (ref 6.5–8.1)

## 2023-04-25 LAB — LACTATE DEHYDROGENASE: LDH: 156 U/L (ref 98–192)

## 2023-04-25 LAB — CBC WITH DIFFERENTIAL/PLATELET
Abs Immature Granulocytes: 0.01 10*3/uL (ref 0.00–0.07)
Basophils Absolute: 0 10*3/uL (ref 0.0–0.1)
Basophils Relative: 0 %
Eosinophils Absolute: 0.1 10*3/uL (ref 0.0–0.5)
Eosinophils Relative: 1 %
HCT: 44.2 % (ref 39.0–52.0)
Hemoglobin: 15.1 g/dL (ref 13.0–17.0)
Immature Granulocytes: 0 %
Lymphocytes Relative: 23 %
Lymphs Abs: 1.3 10*3/uL (ref 0.7–4.0)
MCH: 32.3 pg (ref 26.0–34.0)
MCHC: 34.2 g/dL (ref 30.0–36.0)
MCV: 94.4 fL (ref 80.0–100.0)
Monocytes Absolute: 0.5 10*3/uL (ref 0.1–1.0)
Monocytes Relative: 9 %
Neutro Abs: 3.7 10*3/uL (ref 1.7–7.7)
Neutrophils Relative %: 67 %
Platelets: 117 10*3/uL — ABNORMAL LOW (ref 150–400)
RBC: 4.68 MIL/uL (ref 4.22–5.81)
RDW: 14 % (ref 11.5–15.5)
WBC: 5.6 10*3/uL (ref 4.0–10.5)
nRBC: 0 % (ref 0.0–0.2)

## 2023-04-25 LAB — VITAMIN D 25 HYDROXY (VIT D DEFICIENCY, FRACTURES): Vit D, 25-Hydroxy: 39.85 ng/mL (ref 30–100)

## 2023-04-25 LAB — VITAMIN B12: Vitamin B-12: 704 pg/mL (ref 180–914)

## 2023-04-29 ENCOUNTER — Other Ambulatory Visit: Payer: Self-pay

## 2023-04-29 DIAGNOSIS — D696 Thrombocytopenia, unspecified: Secondary | ICD-10-CM

## 2023-04-29 DIAGNOSIS — R7989 Other specified abnormal findings of blood chemistry: Secondary | ICD-10-CM

## 2023-04-29 DIAGNOSIS — K760 Fatty (change of) liver, not elsewhere classified: Secondary | ICD-10-CM

## 2023-04-30 LAB — METHYLMALONIC ACID, SERUM: Methylmalonic Acid, Quantitative: 290 nmol/L (ref 0–378)

## 2023-05-02 ENCOUNTER — Inpatient Hospital Stay (HOSPITAL_BASED_OUTPATIENT_CLINIC_OR_DEPARTMENT_OTHER): Payer: Medicare HMO | Admitting: Oncology

## 2023-05-02 DIAGNOSIS — E559 Vitamin D deficiency, unspecified: Secondary | ICD-10-CM | POA: Diagnosis not present

## 2023-05-02 DIAGNOSIS — M069 Rheumatoid arthritis, unspecified: Secondary | ICD-10-CM | POA: Diagnosis not present

## 2023-05-02 DIAGNOSIS — D696 Thrombocytopenia, unspecified: Secondary | ICD-10-CM

## 2023-05-02 DIAGNOSIS — E538 Deficiency of other specified B group vitamins: Secondary | ICD-10-CM

## 2023-05-02 NOTE — Progress Notes (Signed)
 Virtual Visit via Telephone Note  I connected with Brendan [name] on 05/02/23 at  1:30 PM EDT by telephone and verified that I am speaking with the correct person using two identifiers.  Location: Patient: Home Provider: Clinic    I discussed the limitations, risks, security and privacy concerns of performing an evaluation and management service by telephone and the availability of in person appointments. I also discussed with the patient that there may be a patient responsible charge related to this service. The patient expressed understanding and agreed to proceed.   History of Present Illness: Patient is a 68 year old thrombocytopenia.  He was last seen in clinic on 10/31/2022 by me.  He denies any interval hospitalizations, surgeries or changes to his baseline health.  Appetite and energy levels are 100%.  Compliant with vitamin D and B12 oral supplements.  He continues to have RA type pain that rotates in severity and location.  He reports persistent easy bruising but denies any bleeding incidents.  Has occasional night sweats for years.  Denies any unintentional weight loss, fevers or chills.  Carlis Abbott stopped working and he was recently switched to [place] which she states is not fully working yet.  He has been taking prednisone daily and using pain meds intermittently.  He has follow-up next Friday.  Reports mostly pain is in his small joints.    Observations/Objective:Review of Systems  Constitutional:  Negative for weight loss.  Respiratory:  Positive for cough.   Musculoskeletal:  Positive for joint pain.  Psychiatric/Behavioral:  The patient has insomnia.     Physical Exam Neurological:     Mental Status: He is alert and oriented to person, place, and time.     Assessment and Plan: 1. Thrombocytopenia (HCC) (Primary) -Mild to moderate thrombocytopenia since December 2019. -Workup was essentially negative. Carlis Abbott stopped working back in September and he was switched to  [employer] infusions which she states have not started working yet.  Reports he takes prednisone each day and has narcotics for breakthrough pain. -Abdominal ultrasound from 06/22/2019 showed hepatic steatosis with a normal spleen. -Most recent labs from 04/25/2023 show platelet count of 117 (107) with otherwise normal CBC and differential. -Etiology felt to be secondary to medication induced thrombocytopenia from [place] versus immune mediated. -Discussed bone marrow biopsy should deviate from baseline. -Return to clinic in 6 months with follow-up and labs a few days  2. B12 deficiency -She continues oral B12 supplements. -Labs from 04/25/2023 showed B12 level of 704 and MMA 290. -Continue B12 supplements.  3. Vitamin D deficiency -Lab work from 04/25/2023 shows a vitamin D level of 39.85. -She is taking vitamin D supplements.  Recommend he continue for now.  4. Rheumatoid arthritis, involving unspecified site, unspecified whether rheumatoid factor present [facility]) -She is followed by rheumatology. -He continues to get infusions with rheumatology.  He has follow-up next week.  Follow Up Instructions: Return to clinic in 6 months with labs a few days before and in office visit.   I discussed the assessment and treatment plan with the patient. The patient was provided an opportunity to ask questions and all were answered. The patient agreed with the plan and demonstrated an understanding of the instructions.   The patient was advised to call back or seek an in-person evaluation if the symptoms worsen or if the condition fails to improve as anticipated.  I provided 20 minutes of non-face-to-face time during this encounter.   Mauro Kaufmann, NP

## 2023-05-08 DIAGNOSIS — M057A Rheumatoid arthritis with rheumatoid factor of other specified site without organ or systems involvement: Secondary | ICD-10-CM | POA: Diagnosis not present

## 2023-05-08 DIAGNOSIS — M1990 Unspecified osteoarthritis, unspecified site: Secondary | ICD-10-CM | POA: Diagnosis not present

## 2023-05-08 DIAGNOSIS — M05711 Rheumatoid arthritis with rheumatoid factor of right shoulder without organ or systems involvement: Secondary | ICD-10-CM | POA: Diagnosis not present

## 2023-05-08 DIAGNOSIS — Z79899 Other long term (current) drug therapy: Secondary | ICD-10-CM | POA: Diagnosis not present

## 2023-05-08 DIAGNOSIS — M659 Unspecified synovitis and tenosynovitis, unspecified site: Secondary | ICD-10-CM | POA: Diagnosis not present

## 2023-05-13 LAB — NASH FIBROSURE(R) PLUS

## 2023-05-16 LAB — NASH FIBROSURE(R) PLUS
ALPHA 2-MACROGLOBULINS, QN: 141 mg/dL (ref 110–276)
ALT (SGPT) P5P: 30 IU/L (ref 0–55)
AST (SGOT) P5P: 19 IU/L (ref 0–40)
Apolipoprotein A-1: 195 mg/dL — ABNORMAL HIGH (ref 101–178)
Bilirubin, Total: 0.6 mg/dL (ref 0.0–1.2)
Cholesterol, Total: 190 mg/dL (ref 100–199)
Fibrosis Score: 0.14 (ref 0.00–0.21)
GGT: 27 IU/L (ref 0–65)
Glucose: 184 mg/dL — ABNORMAL HIGH (ref 70–99)
Haptoglobin: 102 mg/dL (ref 32–363)
NASH Score: 0.58 — ABNORMAL HIGH (ref 0.00–0.25)
Steatosis Score: 0.72 — ABNORMAL HIGH (ref 0.00–0.40)
Triglycerides: 248 mg/dL — ABNORMAL HIGH (ref 0–149)

## 2023-05-16 LAB — CBC WITH DIFFERENTIAL/PLATELET
Basophils Absolute: 0 10*3/uL (ref 0.0–0.2)
Basos: 0 %
EOS (ABSOLUTE): 0 10*3/uL (ref 0.0–0.4)
Eos: 0 %
Hematocrit: 43 % (ref 37.5–51.0)
Hemoglobin: 15 g/dL (ref 13.0–17.7)
Immature Grans (Abs): 0 10*3/uL (ref 0.0–0.1)
Immature Granulocytes: 0 %
Lymphocytes Absolute: 0.6 10*3/uL — ABNORMAL LOW (ref 0.7–3.1)
Lymphs: 10 %
MCH: 33.5 pg — ABNORMAL HIGH (ref 26.6–33.0)
MCHC: 34.9 g/dL (ref 31.5–35.7)
MCV: 96 fL (ref 79–97)
Monocytes Absolute: 0.2 10*3/uL (ref 0.1–0.9)
Monocytes: 4 %
Neutrophils Absolute: 4.9 10*3/uL (ref 1.4–7.0)
Neutrophils: 86 %
Platelets: 111 10*3/uL — ABNORMAL LOW (ref 150–450)
RBC: 4.48 x10E6/uL (ref 4.14–5.80)
RDW: 14.2 % (ref 11.6–15.4)
WBC: 5.8 10*3/uL (ref 3.4–10.8)

## 2023-05-16 LAB — PROTIME-INR
INR: 1 (ref 0.9–1.2)
Prothrombin Time: 11.4 s (ref 9.1–12.0)

## 2023-05-16 LAB — COMPREHENSIVE METABOLIC PANEL WITH GFR
ALT: 28 IU/L (ref 0–44)
AST: 16 IU/L (ref 0–40)
Albumin: 4 g/dL (ref 3.9–4.9)
Alkaline Phosphatase: 39 IU/L — ABNORMAL LOW (ref 44–121)
BUN/Creatinine Ratio: 18 (ref 10–24)
BUN: 17 mg/dL (ref 8–27)
Bilirubin Total: 0.8 mg/dL (ref 0.0–1.2)
CO2: 21 mmol/L (ref 20–29)
Calcium: 9.1 mg/dL (ref 8.6–10.2)
Chloride: 102 mmol/L (ref 96–106)
Creatinine, Ser: 0.94 mg/dL (ref 0.76–1.27)
Globulin, Total: 1.8 g/dL (ref 1.5–4.5)
Glucose: 175 mg/dL — ABNORMAL HIGH (ref 70–99)
Potassium: 4.2 mmol/L (ref 3.5–5.2)
Sodium: 139 mmol/L (ref 134–144)
Total Protein: 5.8 g/dL — ABNORMAL LOW (ref 6.0–8.5)
eGFR: 90 mL/min/{1.73_m2} (ref >59)

## 2023-05-16 LAB — IGG, IGA, IGM
IgA/Immunoglobulin A, Serum: 238 mg/dL (ref 61–437)
IgG (Immunoglobin G), Serum: 655 mg/dL (ref 603–1613)
IgM (Immunoglobulin M), Srm: 165 mg/dL (ref 20–172)

## 2023-05-16 LAB — TISSUE TRANSGLUTAMINASE, IGA

## 2023-05-16 LAB — IRON,TIBC AND FERRITIN PANEL
Ferritin: 302 ng/mL (ref 30–400)
Iron Saturation: 54 % (ref 15–55)
Iron: 150 ug/dL (ref 38–169)
Total Iron Binding Capacity: 280 ug/dL (ref 250–450)
UIBC: 130 ug/dL (ref 111–343)

## 2023-05-16 LAB — MITOCHONDRIAL ANTIBODIES: Mitochondrial Ab: <20 U (ref 0.0–20.0)

## 2023-05-16 LAB — HEPATITIS C ANTIBODY: Hep C Virus Ab: NONREACTIVE

## 2023-05-16 LAB — ANTI-SMOOTH MUSCLE ANTIBODY, IGG: Smooth Muscle Ab: 3 U (ref 0–19)

## 2023-05-16 LAB — ANA: Anti Nuclear Antibody (ANA): POSITIVE — AB

## 2023-05-22 ENCOUNTER — Telehealth: Payer: Self-pay

## 2023-05-22 NOTE — Telephone Encounter (Signed)
 Pt is wanting to know results from recent labs

## 2023-05-22 NOTE — Telephone Encounter (Signed)
 See result note.

## 2023-05-26 ENCOUNTER — Other Ambulatory Visit: Payer: Self-pay | Admitting: *Deleted

## 2023-05-26 DIAGNOSIS — K760 Fatty (change of) liver, not elsewhere classified: Secondary | ICD-10-CM

## 2023-05-29 ENCOUNTER — Ambulatory Visit (HOSPITAL_COMMUNITY)
Admission: RE | Admit: 2023-05-29 | Discharge: 2023-05-29 | Disposition: A | Discharge status: Home / Self Care | Source: Ambulatory Visit | Attending: Gastroenterology | Admitting: Gastroenterology

## 2023-05-29 DIAGNOSIS — K760 Fatty (change of) liver, not elsewhere classified: Secondary | ICD-10-CM | POA: Diagnosis not present

## 2023-06-05 ENCOUNTER — Other Ambulatory Visit: Payer: Self-pay | Admitting: Cardiology

## 2023-07-06 DIAGNOSIS — I2699 Other pulmonary embolism without acute cor pulmonale: Secondary | ICD-10-CM

## 2023-07-06 HISTORY — DX: Other pulmonary embolism without acute cor pulmonale: I26.99

## 2023-07-23 DIAGNOSIS — L570 Actinic keratosis: Secondary | ICD-10-CM | POA: Diagnosis not present

## 2023-07-23 DIAGNOSIS — X32XXXD Exposure to sunlight, subsequent encounter: Secondary | ICD-10-CM | POA: Diagnosis not present

## 2023-08-01 ENCOUNTER — Emergency Department (HOSPITAL_COMMUNITY)

## 2023-08-01 ENCOUNTER — Inpatient Hospital Stay (HOSPITAL_COMMUNITY)
Admission: EM | Admit: 2023-08-01 | Discharge: 2023-08-04 | DRG: 176 | Disposition: A | Discharge status: Home / Self Care | Attending: Internal Medicine | Admitting: Internal Medicine

## 2023-08-01 ENCOUNTER — Encounter (HOSPITAL_COMMUNITY): Payer: Self-pay

## 2023-08-01 ENCOUNTER — Observation Stay (HOSPITAL_COMMUNITY)

## 2023-08-01 ENCOUNTER — Other Ambulatory Visit: Payer: Self-pay

## 2023-08-01 DIAGNOSIS — Z888 Allergy status to other drugs, medicaments and biological substances status: Secondary | ICD-10-CM

## 2023-08-01 DIAGNOSIS — I2510 Atherosclerotic heart disease of native coronary artery without angina pectoris: Secondary | ICD-10-CM | POA: Diagnosis not present

## 2023-08-01 DIAGNOSIS — I214 Non-ST elevation (NSTEMI) myocardial infarction: Secondary | ICD-10-CM | POA: Diagnosis present

## 2023-08-01 DIAGNOSIS — Z8249 Family history of ischemic heart disease and other diseases of the circulatory system: Secondary | ICD-10-CM

## 2023-08-01 DIAGNOSIS — I5042 Chronic combined systolic (congestive) and diastolic (congestive) heart failure: Secondary | ICD-10-CM | POA: Diagnosis present

## 2023-08-01 DIAGNOSIS — I2699 Other pulmonary embolism without acute cor pulmonale: Principal | ICD-10-CM | POA: Diagnosis present

## 2023-08-01 DIAGNOSIS — I700 Atherosclerosis of aorta: Secondary | ICD-10-CM | POA: Diagnosis not present

## 2023-08-01 DIAGNOSIS — M069 Rheumatoid arthritis, unspecified: Secondary | ICD-10-CM | POA: Diagnosis present

## 2023-08-01 DIAGNOSIS — R918 Other nonspecific abnormal finding of lung field: Secondary | ICD-10-CM | POA: Diagnosis not present

## 2023-08-01 DIAGNOSIS — R0789 Other chest pain: Secondary | ICD-10-CM | POA: Diagnosis not present

## 2023-08-01 DIAGNOSIS — Z8711 Personal history of peptic ulcer disease: Secondary | ICD-10-CM

## 2023-08-01 DIAGNOSIS — Z79620 Long term (current) use of immunosuppressive biologic: Secondary | ICD-10-CM

## 2023-08-01 DIAGNOSIS — Z87891 Personal history of nicotine dependence: Secondary | ICD-10-CM

## 2023-08-01 DIAGNOSIS — Z832 Family history of diseases of the blood and blood-forming organs and certain disorders involving the immune mechanism: Secondary | ICD-10-CM

## 2023-08-01 DIAGNOSIS — I252 Old myocardial infarction: Secondary | ICD-10-CM

## 2023-08-01 DIAGNOSIS — Z79899 Other long term (current) drug therapy: Secondary | ICD-10-CM

## 2023-08-01 DIAGNOSIS — E66811 Obesity, class 1: Secondary | ICD-10-CM | POA: Diagnosis present

## 2023-08-01 DIAGNOSIS — M79605 Pain in left leg: Secondary | ICD-10-CM | POA: Diagnosis not present

## 2023-08-01 DIAGNOSIS — K219 Gastro-esophageal reflux disease without esophagitis: Secondary | ICD-10-CM | POA: Diagnosis present

## 2023-08-01 DIAGNOSIS — I4891 Unspecified atrial fibrillation: Secondary | ICD-10-CM | POA: Diagnosis present

## 2023-08-01 DIAGNOSIS — Z7952 Long term (current) use of systemic steroids: Secondary | ICD-10-CM

## 2023-08-01 DIAGNOSIS — I3139 Other pericardial effusion (noninflammatory): Secondary | ICD-10-CM | POA: Diagnosis present

## 2023-08-01 DIAGNOSIS — Z79890 Hormone replacement therapy (postmenopausal): Secondary | ICD-10-CM

## 2023-08-01 DIAGNOSIS — Z6831 Body mass index (BMI) 31.0-31.9, adult: Secondary | ICD-10-CM

## 2023-08-01 DIAGNOSIS — Z8679 Personal history of other diseases of the circulatory system: Secondary | ICD-10-CM

## 2023-08-01 DIAGNOSIS — I428 Other cardiomyopathies: Secondary | ICD-10-CM | POA: Diagnosis present

## 2023-08-01 DIAGNOSIS — R079 Chest pain, unspecified: Secondary | ICD-10-CM | POA: Diagnosis not present

## 2023-08-01 DIAGNOSIS — Z7901 Long term (current) use of anticoagulants: Secondary | ICD-10-CM

## 2023-08-01 DIAGNOSIS — D696 Thrombocytopenia, unspecified: Secondary | ICD-10-CM | POA: Diagnosis present

## 2023-08-01 DIAGNOSIS — Z96642 Presence of left artificial hip joint: Secondary | ICD-10-CM | POA: Diagnosis present

## 2023-08-01 DIAGNOSIS — E785 Hyperlipidemia, unspecified: Secondary | ICD-10-CM | POA: Diagnosis present

## 2023-08-01 DIAGNOSIS — Z7982 Long term (current) use of aspirin: Secondary | ICD-10-CM

## 2023-08-01 DIAGNOSIS — Z602 Problems related to living alone: Secondary | ICD-10-CM | POA: Diagnosis present

## 2023-08-01 DIAGNOSIS — I319 Disease of pericardium, unspecified: Secondary | ICD-10-CM | POA: Diagnosis present

## 2023-08-01 DIAGNOSIS — J439 Emphysema, unspecified: Secondary | ICD-10-CM | POA: Diagnosis not present

## 2023-08-01 DIAGNOSIS — E039 Hypothyroidism, unspecified: Secondary | ICD-10-CM | POA: Diagnosis present

## 2023-08-01 DIAGNOSIS — I314 Cardiac tamponade: Secondary | ICD-10-CM | POA: Diagnosis present

## 2023-08-01 LAB — CBC WITH DIFFERENTIAL/PLATELET
Abs Immature Granulocytes: 0.01 10*3/uL (ref 0.00–0.07)
Basophils Absolute: 0 10*3/uL (ref 0.0–0.1)
Basophils Relative: 0 %
Eosinophils Absolute: 0.1 10*3/uL (ref 0.0–0.5)
Eosinophils Relative: 1 %
HCT: 39 % (ref 39.0–52.0)
Hemoglobin: 13 g/dL (ref 13.0–17.0)
Immature Granulocytes: 0 %
Lymphocytes Relative: 9 %
Lymphs Abs: 0.7 10*3/uL (ref 0.7–4.0)
MCH: 31.5 pg (ref 26.0–34.0)
MCHC: 33.3 g/dL (ref 30.0–36.0)
MCV: 94.4 fL (ref 80.0–100.0)
Monocytes Absolute: 0.7 10*3/uL (ref 0.1–1.0)
Monocytes Relative: 9 %
Neutro Abs: 6.1 10*3/uL (ref 1.7–7.7)
Neutrophils Relative %: 81 %
Platelets: 141 10*3/uL — ABNORMAL LOW (ref 150–400)
RBC: 4.13 MIL/uL — ABNORMAL LOW (ref 4.22–5.81)
RDW: 12.5 % (ref 11.5–15.5)
WBC: 7.5 10*3/uL (ref 4.0–10.5)
nRBC: 0 % (ref 0.0–0.2)

## 2023-08-01 LAB — BRAIN NATRIURETIC PEPTIDE: B Natriuretic Peptide: 56 pg/mL (ref 0.0–100.0)

## 2023-08-01 LAB — COMPREHENSIVE METABOLIC PANEL WITH GFR
ALT: 17 U/L (ref 0–44)
AST: 14 U/L — ABNORMAL LOW (ref 15–41)
Albumin: 3 g/dL — ABNORMAL LOW (ref 3.5–5.0)
Alkaline Phosphatase: 38 U/L (ref 38–126)
Anion gap: 11 (ref 5–15)
BUN: 13 mg/dL (ref 8–23)
CO2: 23 mmol/L (ref 22–32)
Calcium: 8.6 mg/dL — ABNORMAL LOW (ref 8.9–10.3)
Chloride: 102 mmol/L (ref 98–111)
Creatinine, Ser: 0.72 mg/dL (ref 0.61–1.24)
GFR, Estimated: >60 mL/min (ref >60)
Glucose, Bld: 187 mg/dL — ABNORMAL HIGH (ref 70–99)
Potassium: 3.6 mmol/L (ref 3.5–5.1)
Sodium: 136 mmol/L (ref 135–145)
Total Bilirubin: 0.9 mg/dL (ref 0.0–1.2)
Total Protein: 6.3 g/dL — ABNORMAL LOW (ref 6.5–8.1)

## 2023-08-01 LAB — TROPONIN I (HIGH SENSITIVITY)
Troponin I (High Sensitivity): 5 ng/L (ref <18)
Troponin I (High Sensitivity): 4 ng/L (ref <18)

## 2023-08-01 LAB — HEPARIN LEVEL (UNFRACTIONATED): Heparin Unfractionated: 0.56 [IU]/mL (ref 0.30–0.70)

## 2023-08-01 LAB — D-DIMER, QUANTITATIVE: D-Dimer, Quant: 3.89 ug{FEU}/mL — ABNORMAL HIGH (ref 0.00–0.50)

## 2023-08-01 MED ORDER — SODIUM CHLORIDE 0.9 % IV BOLUS
1000.0000 mL | Freq: Once | INTRAVENOUS | Status: AC
  Administered 2023-08-01: 1000 mL via INTRAVENOUS

## 2023-08-01 MED ORDER — ACETAMINOPHEN 650 MG RE SUPP: 650.0000 mg | Freq: Four times a day (QID) | RECTAL | Status: DC | PRN

## 2023-08-01 MED ORDER — PANTOPRAZOLE SODIUM 40 MG PO TBEC
40.0000 mg | DELAYED_RELEASE_TABLET | Freq: Every day | ORAL | Status: DC
  Administered 2023-08-01 – 2023-08-04 (×3): 40 mg via ORAL
  Filled 2023-08-01 (×4): qty 1

## 2023-08-01 MED ORDER — FENTANYL CITRATE PF 50 MCG/ML IJ SOSY
25.0000 ug | PREFILLED_SYRINGE | Freq: Once | INTRAMUSCULAR | Status: AC
  Administered 2023-08-01: 25 ug via INTRAVENOUS
  Filled 2023-08-01: qty 1

## 2023-08-01 MED ORDER — HYDROMORPHONE HCL 1 MG/ML IJ SOLN
0.5000 mg | INTRAMUSCULAR | Status: DC | PRN
  Administered 2023-08-01 (×2): 1 mg via INTRAVENOUS
  Administered 2023-08-01 – 2023-08-02 (×3): 0.5 mg via INTRAVENOUS
  Administered 2023-08-02: 1 mg via INTRAVENOUS
  Administered 2023-08-02: 0.5 mg via INTRAVENOUS
  Filled 2023-08-01 (×7): qty 1
  Filled 2023-08-01: qty 0.5

## 2023-08-01 MED ORDER — ONDANSETRON HCL 4 MG/2ML IJ SOLN: 4.0000 mg | Freq: Four times a day (QID) | INTRAMUSCULAR | Status: DC | PRN

## 2023-08-01 MED ORDER — LEVOTHYROXINE SODIUM 50 MCG PO TABS
50.0000 ug | ORAL_TABLET | Freq: Every day | ORAL | Status: DC
  Administered 2023-08-02 – 2023-08-04 (×3): 50 ug via ORAL
  Filled 2023-08-01 (×2): qty 1
  Filled 2023-08-01: qty 2

## 2023-08-01 MED ORDER — HYDROCODONE-ACETAMINOPHEN 5-325 MG PO TABS: 1.0000 | ORAL_TABLET | Freq: Two times a day (BID) | ORAL | Status: DC | PRN

## 2023-08-01 MED ORDER — LIDOCAINE VISCOUS HCL 2 % MT SOLN
15.0000 mL | Freq: Once | OROMUCOSAL | Status: AC
  Administered 2023-08-01: 15 mL via ORAL
  Filled 2023-08-01: qty 15

## 2023-08-01 MED ORDER — HEPARIN BOLUS VIA INFUSION
5500.0000 [IU] | Freq: Once | INTRAVENOUS | Status: AC
  Administered 2023-08-01: 5500 [IU] via INTRAVENOUS

## 2023-08-01 MED ORDER — NITROGLYCERIN 0.4 MG SL SUBL: 0.4000 mg | SUBLINGUAL_TABLET | SUBLINGUAL | Status: DC | PRN

## 2023-08-01 MED ORDER — IOHEXOL 350 MG/ML SOLN
80.0000 mL | Freq: Once | INTRAVENOUS | Status: AC | PRN
  Administered 2023-08-01: 75 mL via INTRAVENOUS

## 2023-08-01 MED ORDER — ASPIRIN 81 MG PO CHEW
243.0000 mg | CHEWABLE_TABLET | Freq: Once | ORAL | Status: AC
  Administered 2023-08-01: 243 mg via ORAL

## 2023-08-01 MED ORDER — ACETAMINOPHEN 325 MG PO TABS: 650.0000 mg | ORAL_TABLET | Freq: Four times a day (QID) | ORAL | Status: DC | PRN

## 2023-08-01 MED ORDER — HEPARIN (PORCINE) 25000 UT/250ML-% IV SOLN
1750.0000 [IU]/h | INTRAVENOUS | Status: AC
  Administered 2023-08-01 – 2023-08-02 (×3): 1450 [IU]/h via INTRAVENOUS
  Administered 2023-08-04: 1750 [IU]/h via INTRAVENOUS
  Filled 2023-08-01 (×5): qty 250

## 2023-08-01 MED ORDER — ASPIRIN 81 MG PO CHEW
324.0000 mg | CHEWABLE_TABLET | Freq: Once | ORAL | Status: DC
  Filled 2023-08-01: qty 4

## 2023-08-01 MED ORDER — ONDANSETRON HCL 4 MG PO TABS: 4.0000 mg | ORAL_TABLET | Freq: Four times a day (QID) | ORAL | Status: DC | PRN

## 2023-08-01 MED ORDER — ALUM & MAG HYDROXIDE-SIMETH 200-200-20 MG/5ML PO SUSP
30.0000 mL | Freq: Once | ORAL | Status: AC
  Administered 2023-08-01: 30 mL via ORAL
  Filled 2023-08-01: qty 30

## 2023-08-01 MED ORDER — SODIUM CHLORIDE 0.9 % IV BOLUS
500.0000 mL | Freq: Once | INTRAVENOUS | Status: AC
  Administered 2023-08-01: 500 mL via INTRAVENOUS

## 2023-08-01 MED ORDER — CARVEDILOL 3.125 MG PO TABS
3.1250 mg | ORAL_TABLET | Freq: Two times a day (BID) | ORAL | Status: DC
  Administered 2023-08-01 – 2023-08-02 (×2): 3.125 mg via ORAL
  Filled 2023-08-01 (×2): qty 1

## 2023-08-01 MED ORDER — ATORVASTATIN CALCIUM 10 MG PO TABS
10.0000 mg | ORAL_TABLET | Freq: Every day | ORAL | Status: DC
  Administered 2023-08-01 – 2023-08-04 (×4): 10 mg via ORAL
  Filled 2023-08-01 (×4): qty 1

## 2023-08-01 NOTE — Progress Notes (Signed)
 PHARMACY - ANTICOAGULATION CONSULT NOTE  Pharmacy Consult for heparin  Indication: pulmonary embolus  Allergies  Allergen Reactions   Simponi [Golimumab]     Itching, rash    Patient Measurements: Height: 6' (182.9 cm) Weight: 104.3 kg (230 lb) IBW/kg (Calculated) : 77.6 HEPARIN  DW (KG): 99.2  Vital Signs: Temp: 98.1 F (36.7 C) (06/27 1044) BP: 100/64 (06/27 1315) Pulse Rate: 78 (06/27 1315)  Labs: Recent Labs    08/01/23 1111 08/01/23 1235  HGB 13.0  --   HCT 39.0  --   PLT 141*  --   CREATININE 0.72  --   TROPONINIHS 5 4    Estimated Creatinine Clearance: 113.4 mL/min (by C-G formula based on SCr of 0.72 mg/dL).   Medical History: Past Medical History:  Diagnosis Date   CHF (congestive heart failure) (HCC)    a. EF 25-30% in 01/2018 with cath showing no significant CAD. b.  EF 35-40% in 04/2018 c. at 40-45% in 01/2019 e. EF at 50% in 06/2020   GERD (gastroesophageal reflux disease)    Hypothyroidism    Myocardial infarction (HCC)    Osteoarthritis    Rheumatoid arthritis (HCC)    Rheumatoid arthritis (HCC)     Medications:  (Not in a hospital admission)   Assessment: Pharmacy consulted to dose heparin  in patient with pulmonary embolism.  Patient is not on anticoagulation prior to admission.  CBC WNL D-Dimer 3.89  Goal of Therapy:  Heparin  level 0.3-0.7 units/ml Monitor platelets by anticoagulation protocol: Yes   Plan:  Give 5500 units bolus x 1 Start heparin  infusion at 1450 units/hr Check anti-Xa level in 6 hours and daily while on heparin  Continue to monitor H&H and platelets  Elspeth Sour, PharmD Clinical Pharmacist 08/01/2023 1:42 PM

## 2023-08-01 NOTE — ED Triage Notes (Addendum)
 Pt from home complains of chest pain that started last night. Describes pain as sharp/pressure. Endorse dry mouth and fatigue. Denies SOB but states it hurts that breath. O2 @ 97% RA, Pt AAOx4,ambulatory Pt states he took 2 nitroglycerin  pills this morning PTA but no relief.

## 2023-08-01 NOTE — Progress Notes (Signed)
 PHARMACY - ANTICOAGULATION CONSULT NOTE  Pharmacy Consult for heparin  Indication: pulmonary embolus  Allergies  Allergen Reactions   Simponi [Golimumab]     Itching, rash    Patient Measurements: Height: 6' (182.9 cm) Weight: 104.3 kg (230 lb) IBW/kg (Calculated) : 77.6 HEPARIN  DW (KG): 99.2  Vital Signs: Temp: 98.6 F (37 C) (06/27 1959) Temp Source: Oral (06/27 1959) BP: 116/79 (06/27 1959) Pulse Rate: 83 (06/27 1959)  Labs: Recent Labs    08/01/23 1111 08/01/23 1235 08/01/23 2105  HGB 13.0  --   --   HCT 39.0  --   --   PLT 141*  --   --   HEPARINUNFRC  --   --  0.56  CREATININE 0.72  --   --   TROPONINIHS 5 4  --     Estimated Creatinine Clearance: 113.4 mL/min (by C-G formula based on SCr of 0.72 mg/dL).   Medical History: Past Medical History:  Diagnosis Date   CHF (congestive heart failure) (HCC)    a. EF 25-30% in 01/2018 with cath showing no significant CAD. b.  EF 35-40% in 04/2018 c. at 40-45% in 01/2019 e. EF at 50% in 06/2020   GERD (gastroesophageal reflux disease)    Hypothyroidism    Myocardial infarction (HCC)    Osteoarthritis    Rheumatoid arthritis (HCC)    Rheumatoid arthritis (HCC)     Medications:  Medications Prior to Admission  Medication Sig Dispense Refill Last Dose/Taking   atorvastatin  (LIPITOR) 10 MG tablet TAKE 1 TABLET BY MOUTH EVERY DAY 90 tablet 3 07/31/2023   carvedilol  (COREG ) 3.125 MG tablet TAKE 1 TABLET BY MOUTH EVERY MORNING AND TAKE 2 TABLETS EVERY EVENING 270 tablet 3 08/01/2023   clobetasol (TEMOVATE) 0.05 % external solution Apply 1 application topically 2 (two) times daily as needed (to affected areas of scalp).    Unknown   Coenzyme Q10 (Q-10 CO-ENZYME PO) Take 1 tablet by mouth daily.   07/31/2023   Cyanocobalamin  (VITAMIN B 12 PO) Take 1 tablet by mouth daily.   07/31/2023   etanercept (ENBREL) 50 MG/ML injection Inject 50 mg into the skin once a week.   07/29/2023   HYDROcodone -acetaminophen  (NORCO/VICODIN)  5-325 MG tablet Take 1 tablet by mouth every 12 (twelve) hours as needed for moderate pain.   07/31/2023   levothyroxine  (SYNTHROID ) 50 MCG tablet Take 50 mcg by mouth daily before breakfast.   08/01/2023   MEGARED OMEGA-3 KRILL OIL 500 MG CAPS Take 1 tablet by mouth daily.   07/31/2023   nitroGLYCERIN  (NITROSTAT ) 0.4 MG SL tablet Place 1 tablet (0.4 mg total) under the tongue every 5 (five) minutes x 3 doses as needed for chest pain. 25 tablet 0 08/01/2023   pantoprazole  (PROTONIX ) 40 MG tablet TAKE 1 TABLET BY MOUTH EVERY DAY 90 tablet 3 07/31/2023   predniSONE  (DELTASONE ) 10 MG tablet Take 10-40 mg by mouth daily as needed ([place]).   07/31/2023   sacubitril -valsartan  (ENTRESTO ) 97-103 MG Take 1 tablet by mouth 2 (two) times daily. 60 tablet 6 08/01/2023   VITAMIN D  PO Take 1 tablet by mouth daily.   07/31/2023    Assessment: Pharmacy consulted to dose heparin  in patient with pulmonary embolism.  Patient is not on anticoagulation prior to admission.  CBC WNL D-Dimer 3.89  Goal of Therapy:  Heparin  level 0.3-0.7 units/ml Monitor platelets by anticoagulation protocol: Yes     Plan:  6/27@2105 : HL 0.56, therapeutic x 1 Continue heparin  infusion at 1450 units/hr Check anti-Xa  level tomorrow with morning labs Continue to monitor H&H and platelets  Betheny Suchecki A Starlyn Droge, PharmD Clinical Pharmacist 08/01/2023 9:37 PM

## 2023-08-01 NOTE — ED Provider Notes (Signed)
  [facility] AT [facility] Provider Note    Note created in error.  Please see note from same day   Brendan [name] 08/01/23 1859    Suzette Pac, MD 08/04/23 [phone]

## 2023-08-01 NOTE — ED Provider Notes (Signed)
[facility] MEDICAL SURGICAL UNIT Provider Note   CSN: [id number] Arrival date & time: 08/01/23  1031     Patient presents with: Chest Pain   Brendan [name] is a 68 y.o. male.   Patient is a 68 year old male who presents to the emergency department with a chief complaint of chest pressure which has been ongoing since approximately 1 AM this morning.  Patient notes that he has had no associated shortness of breath but notes that his pain is worse with deep inspiration.  He notes that the pain has not been exertional or positional in nature.  He notes he does follow with Dr. Alvan with cardiology.  He has had no associated hemoptysis but does note that he has had ongoing swelling to bilateral lower extremities.  He denies any associated dizziness, lightheadedness or syncope.  He denies any cough, congestion, rhinorrhea, sore throat.   Chest Pain      Prior to Admission medications   Medication Sig Start Date End Date Taking? Authorizing Provider  atorvastatin  (LIPITOR) 10 MG tablet TAKE 1 TABLET BY MOUTH EVERY DAY 08/28/22  Yes Branch, Dorn FALCON, MD  carvedilol  (COREG ) 3.125 MG tablet TAKE 1 TABLET BY MOUTH EVERY MORNING AND TAKE 2 TABLETS EVERY EVENING 06/05/23  Yes Branch, Dorn FALCON, MD  clobetasol (TEMOVATE) 0.05 % external solution Apply 1 application topically 2 (two) times daily as needed (to affected areas of scalp).  10/23/18  Yes [provider]  Coenzyme Q10 (Q-10 CO-ENZYME PO) Take 1 tablet by mouth daily.   Yes [provider]  Cyanocobalamin  (VITAMIN B 12 PO) Take 1 tablet by mouth daily.   Yes [provider]  etanercept (ENBREL) 50 MG/ML injection Inject 50 mg into the skin once a week.   Yes [provider]  HYDROcodone -acetaminophen  (NORCO/VICODIN) 5-325 MG tablet Take 1 tablet by mouth every 12 (twelve) hours as needed for moderate pain. 09/30/22  Yes [provider]  levothyroxine  (SYNTHROID ) 50 MCG tablet Take 50 mcg by  mouth daily before breakfast.   Yes [provider]  MEGARED OMEGA-3 KRILL OIL 500 MG CAPS Take 1 tablet by mouth daily.   Yes [provider]  nitroGLYCERIN  (NITROSTAT ) 0.4 MG SL tablet Place 1 tablet (0.4 mg total) under the tongue every 5 (five) minutes x 3 doses as needed for chest pain. 08/28/20  Yes Alvan Dorn FALCON, MD  pantoprazole  (PROTONIX ) 40 MG tablet TAKE 1 TABLET BY MOUTH EVERY DAY 09/04/22  Yes Ezzard Sonny RAMAN, PA-C  predniSONE  (DELTASONE ) 10 MG tablet Take 10-40 mg by mouth daily as needed ([place]). 03/17/20  Yes [provider]  sacubitril -valsartan  (ENTRESTO ) 97-103 MG Take 1 tablet by mouth 2 (two) times daily. 12/02/22  Yes Alvan Dorn FALCON, MD  VITAMIN D  PO Take 1 tablet by mouth daily.   Yes [provider]    Allergies: Simponi [golimumab]    Review of Systems  Cardiovascular:  Positive for chest pain.  All other systems reviewed and are negative.   Updated Vital Signs BP 98/81 (BP Location: Right Arm)   Pulse 78   Temp 97.7 F (36.5 C) (Oral)   Resp 18   Ht 6' (1.829 m)   Wt 104.3 kg   SpO2 94%   BMI 31.19 kg/m   Physical Exam Vitals and nursing note reviewed.  Constitutional:      Appearance: Normal appearance.  HENT:     Head: Normocephalic and atraumatic.     Nose: Nose normal.  Mouth/Throat:     Mouth: Mucous membranes are moist.   Eyes:     Extraocular Movements: Extraocular movements intact.     Conjunctiva/sclera: Conjunctivae normal.     Pupils: Pupils are equal, round, and reactive to light.    Cardiovascular:     Rate and Rhythm: Normal rate and regular rhythm.     Pulses: Normal pulses.     Heart sounds: Normal heart sounds. Heart sounds not distant. No murmur heard. Pulmonary:     Effort: Pulmonary effort is normal. No tachypnea or respiratory distress.     Breath sounds: Normal breath sounds. No stridor. No decreased breath sounds, wheezing, rhonchi or rales.  Chest:     Chest wall: No  tenderness.  Abdominal:     General: Abdomen is flat. Bowel sounds are normal.     Palpations: Abdomen is soft. There is no mass.     Tenderness: There is no abdominal tenderness. There is no guarding.   Musculoskeletal:        General: Normal range of motion.     Cervical back: Normal range of motion and neck supple.     Right lower leg: Edema present.     Left lower leg: Edema present.   Skin:    General: Skin is warm and dry.     Findings: No erythema or rash.   Neurological:     General: No focal deficit present.     Mental Status: He is alert and oriented to person, place, and time. Mental status is at baseline.     Cranial Nerves: No cranial nerve deficit.     Motor: No weakness.   Psychiatric:        Mood and Affect: Mood normal.        Behavior: Behavior normal.        Thought Content: Thought content normal.        Judgment: Judgment normal.     (all labs ordered are listed, but only abnormal results are displayed) Labs Reviewed  COMPREHENSIVE METABOLIC PANEL WITH GFR - Abnormal; Notable for the following components:      Result Value   Glucose, Bld 187 (*)    Calcium  8.6 (*)    Total Protein 6.3 (*)    Albumin 3.0 (*)    AST 14 (*)    All other components within normal limits  CBC WITH DIFFERENTIAL/PLATELET - Abnormal; Notable for the following components:   RBC 4.13 (*)    Platelets 141 (*)    All other components within normal limits  D-DIMER, QUANTITATIVE - Abnormal; Notable for the following components:   D-Dimer, Quant 3.89 (*)    All other components within normal limits  BRAIN NATRIURETIC PEPTIDE  HEPARIN  LEVEL (UNFRACTIONATED)  HIV ANTIBODY (ROUTINE TESTING W REFLEX)  HEPARIN  LEVEL (UNFRACTIONATED)  CBC  MAGNESIUM   COMPREHENSIVE METABOLIC PANEL WITH GFR  TROPONIN I (HIGH SENSITIVITY)  TROPONIN I (HIGH SENSITIVITY)    EKG: EKG Interpretation Date/Time:  Friday August 01 2023 11:21:20 EDT Ventricular Rate:  79 PR Interval:  175 QRS  Duration:  152 QT Interval:  401 QTC Calculation: 460 R Axis:   -68  Text Interpretation: Sinus rhythm RBBB and LAFB Confirmed by Suzette Pac [phone]) on 08/01/2023 11:22:59 AM  Radiology: US  Venous Img Lower Bilateral (DVT) Result Date: 08/01/2023 CLINICAL DATA:  Scattered tiny bilateral pulmonary emboli, very low thrombus burden, leg pain EXAM: BILATERAL LOWER EXTREMITY VENOUS DOPPLER ULTRASOUND TECHNIQUE: Gray-scale sonography with graded compression, as well as color  Doppler and duplex ultrasound were performed to evaluate the lower extremity deep venous systems from the level of the common femoral vein and including the common femoral, femoral, profunda femoral, popliteal and calf veins including the posterior tibial, peroneal and gastrocnemius veins when visible. Spectral Doppler was utilized to evaluate flow at rest and with distal augmentation maneuvers in the common femoral, femoral and popliteal veins. COMPARISON:  None Available. FINDINGS: RIGHT LOWER EXTREMITY Common Femoral Vein: No evidence of thrombus. Normal compressibility, respiratory phasicity and response to augmentation. Saphenofemoral Junction: No evidence of thrombus. Normal compressibility and flow on color Doppler imaging. Profunda Femoral Vein: No evidence of thrombus. Normal compressibility and flow on color Doppler imaging. Femoral Vein: No evidence of thrombus. Normal compressibility, respiratory phasicity and response to augmentation. Popliteal Vein: No evidence of thrombus. Normal compressibility, respiratory phasicity and response to augmentation. Calf Veins: Limited visualization of the right posterior tibial peroneal veins. Difficult to exclude residual minor right calf region DVT. No propagation into the popliteal or femoral veins. LEFT LOWER EXTREMITY Common Femoral Vein: No evidence of thrombus. Normal compressibility, respiratory phasicity and response to augmentation. Saphenofemoral Junction: No evidence of thrombus.  Normal compressibility and flow on color Doppler imaging. Profunda Femoral Vein: No evidence of thrombus. Normal compressibility and flow on color Doppler imaging. Femoral Vein: No evidence of thrombus. Normal compressibility, respiratory phasicity and response to augmentation. Popliteal Vein: No evidence of thrombus. Normal compressibility, respiratory phasicity and response to augmentation. Calf Veins: No evidence of thrombus. Normal compressibility and flow on color Doppler imaging. IMPRESSION: 1. No evidence of significant acute or occlusive lower extremity DVT. 2. Limited visualization of the right posterior tibial and peroneal veins. Difficult to exclude residual minor right calf region DVT. No propagation into the popliteal or femoral veins. Electronically Signed   By: CHRISTELLA.  Shick M.D.   On: 08/01/2023 15:00   CT Angio Chest PE W/Cm &/Or Wo Cm Result Date: 08/01/2023 CLINICAL DATA:  Acute onset pleuritic chest pain EXAM: CT ANGIOGRAPHY CHEST WITH CONTRAST TECHNIQUE: Multidetector CT imaging of the chest was performed using the standard protocol during bolus administration of intravenous contrast. Multiplanar CT image reconstructions and MIPs were obtained to evaluate the vascular anatomy. RADIATION DOSE REDUCTION: This exam was performed according to the departmental dose-optimization program which includes automated exposure control, adjustment of the mA and/or kV according to patient size and/or use of iterative reconstruction technique. CONTRAST:  75mL OMNIPAQUE  IOHEXOL  350 MG/ML SOLN COMPARISON:  Same day chest radiograph, CTA chest dated 08/19/2022, 07/30/2021 FINDINGS: Cardiovascular: The study is high quality for the evaluation of pulmonary embolism. Multifocal bilateral filling defects involving the right upper, middle, and lower lobes as well as lingula, most proximally within the right lower lobe proximal segmental pulmonary artery (5:161). Great vessels are normal in course and caliber. RV: LV  ratio less than 1. Trace pericardial effusion. Coronary artery calcifications and aortic atherosclerosis. Mediastinum/Nodes: Imaged thyroid  gland without nodules meeting criteria for imaging follow-up by size. Normal esophagus. No pathologically enlarged axillary, supraclavicular, mediastinal, or hilar lymph nodes. Lungs/Pleura: The central airways are patent. Trace secretions within the trachea. Mild diffuse bronchial wall thickening. Mild paraseptal emphysema. Posterior right apical 5 x 4 mm nodule (6:43), basilar right lower lobe 3 mm nodule (6:85), 2 mm left upper lobe nodule (6:62), and peripheral 2 mm left lower lobe nodule (6:107), as well as 3 mm left lower lobe perifissural nodules (6:72) are unchanged dating back to 07/30/2021, likely benign. No specific follow-up imaging recommended. No pneumothorax. No pleural  effusion. Upper abdomen: Normal. Musculoskeletal: No acute or abnormal lytic or blastic osseous lesions. Multilevel degenerative changes of the thoracic spine. Review of the MIP images confirms the above findings. IMPRESSION: 1. Multifocal bilateral pulmonary emboli involving the right upper, middle, and lower lobes as well as lingula, most proximally within the right lower lobe proximal segmental pulmonary artery. No evidence of right heart strain. 2. Mild peribronchial wall thickening, which may represent bronchitis. 3. Aortic Atherosclerosis (ICD10-I70.0) and Emphysema (ICD10-J43.9). Coronary artery calcifications. Assessment for potential risk factor modification, dietary therapy or pharmacologic therapy may be warranted, if clinically indicated. Critical Value/emergent results were called by telephone at the time of interpretation on 08/01/2023 at 1:40 pm to Dr. Zammit, who verbally acknowledged these results. Electronically Signed   By: Limin  Xu M.D.   On: 08/01/2023 13:43   DG Chest Port 1 View Result Date: 08/01/2023 CLINICAL DATA:  Chest pain EXAM: PORTABLE CHEST 1 VIEW COMPARISON:   X-ray 01/15/2018.  CTA chest 08/19/2022 FINDINGS: Underinflation. Calcified aorta. Borderline cardiopericardial silhouette. No consolidation, pneumothorax or effusion. No edema. Overlapping cardiac leads. Degenerative changes along the spine. IMPRESSION: Underinflation. Borderline size heart. No acute cardiopulmonary disease. Electronically Signed   By: Ranell Bring M.D.   On: 08/01/2023 11:33     .Critical Care  Performed by: Daralene Lonni BIRCH, PA-C Authorized by: Daralene Lonni BIRCH, PA-C   Critical care provider statement:    Critical care time (minutes):  35   Critical care was necessary to treat or prevent imminent or life-threatening deterioration of the following conditions: Bilateral pulmonary emboli, heparin  drip.   Critical care was time spent personally by me on the following activities:  Development of treatment plan with patient or surrogate, discussions with consultants, evaluation of patient's response to treatment, examination of patient, ordering and review of laboratory studies, ordering and review of radiographic studies, ordering and performing treatments and interventions, pulse oximetry, re-evaluation of patient's condition and review of old charts   I assumed direction of critical care for this patient from another provider in my specialty: no     Care discussed with: admitting provider      Medications Ordered in the ED  heparin  ADULT infusion 100 units/mL (25000 units/250mL) (1,450 Units/hr Intravenous Infusion Verify 08/01/23 1422)  HYDROcodone -acetaminophen  (NORCO/VICODIN) 5-325 MG per tablet 1 tablet (has no administration in time range)  atorvastatin  (LIPITOR) tablet 10 mg (10 mg Oral Given 08/01/23 1605)  carvedilol  (COREG ) tablet 3.125 mg (3.125 mg Oral Given 08/01/23 1605)  nitroGLYCERIN  (NITROSTAT ) SL tablet 0.4 mg (has no administration in time range)  levothyroxine  (SYNTHROID ) tablet 50 mcg (has no administration in time range)  pantoprazole  (PROTONIX ) EC  tablet 40 mg (40 mg Oral Given 08/01/23 1605)  acetaminophen  (TYLENOL ) tablet 650 mg (has no administration in time range)    Or  acetaminophen  (TYLENOL ) suppository 650 mg (has no administration in time range)  HYDROmorphone  (DILAUDID ) injection 0.5-1 mg (1 mg Intravenous Given 08/01/23 1603)  ondansetron  (ZOFRAN ) tablet 4 mg (has no administration in time range)    Or  ondansetron  (ZOFRAN ) injection 4 mg (has no administration in time range)  aspirin  chewable tablet 243 mg (243 mg Oral Given 08/01/23 1112)  sodium chloride  0.9 % bolus 1,000 mL (0 mLs Intravenous Stopped 08/01/23 1300)  fentaNYL  (SUBLIMAZE ) injection 25 mcg (25 mcg Intravenous Given 08/01/23 1146)  iohexol  (OMNIPAQUE ) 350 MG/ML injection 80 mL (75 mLs Intravenous Contrast Given 08/01/23 1258)  sodium chloride  0.9 % bolus 500 mL (500 mLs Intravenous New Bag/Given 08/01/23  1328)  fentaNYL  (SUBLIMAZE ) injection 25 mcg (25 mcg Intravenous Given 08/01/23 1327)  alum & mag hydroxide-simeth (MAALOX/MYLANTA) 200-200-20 MG/5ML suspension 30 mL (30 mLs Oral Given 08/01/23 1402)    And  lidocaine  (XYLOCAINE ) 2 % viscous mouth solution 15 mL (15 mLs Oral Given 08/01/23 1402)  heparin  bolus via infusion 5,500 Units (5,500 Units Intravenous Bolus from Bag 08/01/23 1404)                                    Medical Decision Making Amount and/or Complexity of Data Reviewed Labs: ordered. Radiology: ordered.  Risk OTC drugs. Prescription drug management. Decision regarding hospitalization.   This patient presents to the ED for concern of chest pain, this involves an extensive number of treatment options, and is a complaint that carries with it a high risk of complications and morbidity.  The differential diagnosis includes ACS, pulmonary embolus, pericarditis, myocarditis, endocarditis, pneumothorax, hemothorax, pneumonia, sepsis   Co morbidities that complicate the patient evaluation  CHF   Additional history obtained:  Additional  history obtained from medical records External records from outside source obtained and reviewed including medical records   Lab Tests:  I Ordered, and personally interpreted labs.  The pertinent results include: No leukocytosis, no anemia, normal electrolytes, normal kidney function liver function, negative serial troponins, negative BNP, elevated D-dimer   Imaging Studies ordered:  I ordered imaging studies including chest x-ray, CTA of chest I independently visualized and interpreted imaging which showed no acute cardiopulmonary process on chest x-ray, CTA with bilateral pulmonary emboli I agree with the radiologist interpretation   Cardiac Monitoring: / EKG:  The patient was maintained on a cardiac monitor.  I personally viewed and interpreted the cardiac monitored which showed an underlying rhythm of: Normal sinus rhythm, right bundle branch block, no ST/T wave changes, no STEMI   Consultations Obtained:  I requested consultation with the cardiology, Dr. Alvan, hospitalist,  and discussed lab and imaging findings as well as pertinent plan - they recommend: Admission, heparin  drip   Problem List / ED Course / Critical interventions / Medication management  Patient is doing well at this time and does remain stable.  Discussed with patient we will plan for admission to the hospitalist service given his bilateral pulmonary emboli.  He has been started on a heparin  drip at this point.  Patient had no indication of right heart strain on CT scan.  Patient has continued to have blood pressures that have been on the lower side in the emergency department but he has been maintaining his MAP.  Do not suspect ACS at this time as EKG has no ischemic changes and he has negative serial troponins.  Blood work is otherwise been unremarkable as well.  Have discussed patient case with Dr. Maree with the hospitalist service who has accepted for admission. I ordered medication including fentanyl , IV  fluids, heparin  drip, aspirin  for chest pain, pulmonary embolus Reevaluation of the patient after these medicines showed that the patient improved I have reviewed the patients home medicines and have made adjustments as needed   Social Determinants of Health:  None   Test / Admission - Considered:  Admission     Final diagnoses:  Chest pain, unspecified type    ED Discharge Orders     None          Daralene Lonni JONETTA DEVONNA 08/01/23 JACKEY Suzette Pac, MD  08/04/23 0845  
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
## 2023-08-02 ENCOUNTER — Observation Stay (HOSPITAL_BASED_OUTPATIENT_CLINIC_OR_DEPARTMENT_OTHER)

## 2023-08-02 DIAGNOSIS — Z743 Need for continuous supervision: Secondary | ICD-10-CM | POA: Diagnosis not present

## 2023-08-02 DIAGNOSIS — I428 Other cardiomyopathies: Secondary | ICD-10-CM | POA: Diagnosis not present

## 2023-08-02 DIAGNOSIS — Z79620 Long term (current) use of immunosuppressive biologic: Secondary | ICD-10-CM | POA: Diagnosis not present

## 2023-08-02 DIAGNOSIS — R079 Chest pain, unspecified: Secondary | ICD-10-CM | POA: Diagnosis not present

## 2023-08-02 DIAGNOSIS — E785 Hyperlipidemia, unspecified: Secondary | ICD-10-CM | POA: Diagnosis not present

## 2023-08-02 DIAGNOSIS — M0579 Rheumatoid arthritis with rheumatoid factor of multiple sites without organ or systems involvement: Secondary | ICD-10-CM

## 2023-08-02 DIAGNOSIS — I252 Old myocardial infarction: Secondary | ICD-10-CM | POA: Diagnosis not present

## 2023-08-02 DIAGNOSIS — D696 Thrombocytopenia, unspecified: Secondary | ICD-10-CM | POA: Diagnosis not present

## 2023-08-02 DIAGNOSIS — I314 Cardiac tamponade: Secondary | ICD-10-CM | POA: Diagnosis not present

## 2023-08-02 DIAGNOSIS — Z79899 Other long term (current) drug therapy: Secondary | ICD-10-CM | POA: Diagnosis not present

## 2023-08-02 DIAGNOSIS — Z832 Family history of diseases of the blood and blood-forming organs and certain disorders involving the immune mechanism: Secondary | ICD-10-CM | POA: Diagnosis not present

## 2023-08-02 DIAGNOSIS — M069 Rheumatoid arthritis, unspecified: Secondary | ICD-10-CM | POA: Diagnosis not present

## 2023-08-02 DIAGNOSIS — I5032 Chronic diastolic (congestive) heart failure: Secondary | ICD-10-CM | POA: Diagnosis not present

## 2023-08-02 DIAGNOSIS — I5042 Chronic combined systolic (congestive) and diastolic (congestive) heart failure: Secondary | ICD-10-CM | POA: Diagnosis not present

## 2023-08-02 DIAGNOSIS — Z87891 Personal history of nicotine dependence: Secondary | ICD-10-CM | POA: Diagnosis not present

## 2023-08-02 DIAGNOSIS — R000 Tachycardia, unspecified: Secondary | ICD-10-CM | POA: Diagnosis not present

## 2023-08-02 DIAGNOSIS — Z8249 Family history of ischemic heart disease and other diseases of the circulatory system: Secondary | ICD-10-CM | POA: Diagnosis not present

## 2023-08-02 DIAGNOSIS — Z6831 Body mass index (BMI) 31.0-31.9, adult: Secondary | ICD-10-CM | POA: Diagnosis not present

## 2023-08-02 DIAGNOSIS — Z7952 Long term (current) use of systemic steroids: Secondary | ICD-10-CM | POA: Diagnosis not present

## 2023-08-02 DIAGNOSIS — Z96642 Presence of left artificial hip joint: Secondary | ICD-10-CM | POA: Diagnosis not present

## 2023-08-02 DIAGNOSIS — I4891 Unspecified atrial fibrillation: Secondary | ICD-10-CM | POA: Diagnosis not present

## 2023-08-02 DIAGNOSIS — I3139 Other pericardial effusion (noninflammatory): Secondary | ICD-10-CM | POA: Diagnosis not present

## 2023-08-02 DIAGNOSIS — K219 Gastro-esophageal reflux disease without esophagitis: Secondary | ICD-10-CM | POA: Diagnosis not present

## 2023-08-02 DIAGNOSIS — I214 Non-ST elevation (NSTEMI) myocardial infarction: Secondary | ICD-10-CM | POA: Diagnosis not present

## 2023-08-02 DIAGNOSIS — I308 Other forms of acute pericarditis: Secondary | ICD-10-CM | POA: Diagnosis not present

## 2023-08-02 DIAGNOSIS — Z79890 Hormone replacement therapy (postmenopausal): Secondary | ICD-10-CM | POA: Diagnosis not present

## 2023-08-02 DIAGNOSIS — Z7901 Long term (current) use of anticoagulants: Secondary | ICD-10-CM | POA: Diagnosis not present

## 2023-08-02 DIAGNOSIS — Z7982 Long term (current) use of aspirin: Secondary | ICD-10-CM | POA: Diagnosis not present

## 2023-08-02 DIAGNOSIS — I2699 Other pulmonary embolism without acute cor pulmonale: Secondary | ICD-10-CM | POA: Diagnosis not present

## 2023-08-02 DIAGNOSIS — E782 Mixed hyperlipidemia: Secondary | ICD-10-CM | POA: Diagnosis not present

## 2023-08-02 DIAGNOSIS — I319 Disease of pericardium, unspecified: Secondary | ICD-10-CM | POA: Diagnosis not present

## 2023-08-02 DIAGNOSIS — E039 Hypothyroidism, unspecified: Secondary | ICD-10-CM | POA: Diagnosis not present

## 2023-08-02 LAB — COMPREHENSIVE METABOLIC PANEL WITH GFR
ALT: 15 U/L (ref 0–44)
AST: 10 U/L — ABNORMAL LOW (ref 15–41)
Albumin: 2.9 g/dL — ABNORMAL LOW (ref 3.5–5.0)
Alkaline Phosphatase: 35 U/L — ABNORMAL LOW (ref 38–126)
Anion gap: 11 (ref 5–15)
BUN: 14 mg/dL (ref 8–23)
CO2: 23 mmol/L (ref 22–32)
Calcium: 8.6 mg/dL — ABNORMAL LOW (ref 8.9–10.3)
Chloride: 102 mmol/L (ref 98–111)
Creatinine, Ser: 0.86 mg/dL (ref 0.61–1.24)
GFR, Estimated: >60 mL/min (ref >60)
Glucose, Bld: 136 mg/dL — ABNORMAL HIGH (ref 70–99)
Potassium: 4.2 mmol/L (ref 3.5–5.1)
Sodium: 136 mmol/L (ref 135–145)
Total Bilirubin: 1.6 mg/dL — ABNORMAL HIGH (ref 0.0–1.2)
Total Protein: 6.4 g/dL — ABNORMAL LOW (ref 6.5–8.1)

## 2023-08-02 LAB — CBC
HCT: 40.8 % (ref 39.0–52.0)
Hemoglobin: 13.7 g/dL (ref 13.0–17.0)
MCH: 32.5 pg (ref 26.0–34.0)
MCHC: 33.6 g/dL (ref 30.0–36.0)
MCV: 96.9 fL (ref 80.0–100.0)
Platelets: 157 10*3/uL (ref 150–400)
RBC: 4.21 MIL/uL — ABNORMAL LOW (ref 4.22–5.81)
RDW: 12.8 % (ref 11.5–15.5)
WBC: 10.8 10*3/uL — ABNORMAL HIGH (ref 4.0–10.5)
nRBC: 0 % (ref 0.0–0.2)

## 2023-08-02 LAB — ECHOCARDIOGRAM COMPLETE
Area-P 1/2: 4.23 cm2
Calc EF: 68.3 %
Height: 72 in
S' Lateral: 4.2 cm
Single Plane A2C EF: 74.2 %
Single Plane A4C EF: 62.4 %
Weight: 3680 [oz_av]

## 2023-08-02 LAB — SEDIMENTATION RATE: Sed Rate: 64 mm/h — ABNORMAL HIGH (ref 0–16)

## 2023-08-02 LAB — HIV ANTIBODY (ROUTINE TESTING W REFLEX): HIV Screen 4th Generation wRfx: REACTIVE — AB

## 2023-08-02 LAB — MAGNESIUM: Magnesium: 2 mg/dL (ref 1.7–2.4)

## 2023-08-02 LAB — C-REACTIVE PROTEIN: CRP: 31.8 mg/dL — ABNORMAL HIGH (ref <1.0)

## 2023-08-02 LAB — HEPARIN LEVEL (UNFRACTIONATED): Heparin Unfractionated: 0.49 [IU]/mL (ref 0.30–0.70)

## 2023-08-02 MED ORDER — COLCHICINE 0.6 MG PO TABS
0.6000 mg | ORAL_TABLET | Freq: Two times a day (BID) | ORAL | Status: DC
  Administered 2023-08-02 – 2023-08-04 (×5): 0.6 mg via ORAL
  Filled 2023-08-02 (×5): qty 1

## 2023-08-02 MED ORDER — PREDNISONE 20 MG PO TABS
20.0000 mg | ORAL_TABLET | Freq: Every day | ORAL | Status: DC
  Administered 2023-08-02 – 2023-08-04 (×3): 20 mg via ORAL
  Filled 2023-08-02 (×3): qty 1

## 2023-08-02 NOTE — Consult Note (Addendum)
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  Possible intermittent diastolic RV collapse and enhanced respiratory variation in mitral flow.  Having pleuritic chest pain with negative troponins.  EKG with no acute ST-T wave changes, no PR depressions.  Clinically looks stable with appropriate BP and heart rates.  Given his severe and long history of RA history, autoimmune etiology seems suspicious.  Currently no indications for pericardiocentesis.  We will continue to monitor very closely. Should repeat limited echocardiogram 6/30 (not ordering today).  Depending on vital signs and inflammatory markers, etc will adjust frequency of this. Getting ANA, ESR/CRP, expecting this to be elevated.  Using to trend. Continue colchicine 0.6 mg twice daily, prednisone  20 mg daily while inpatient.  Continue therapy depending on signs/inflammatory markers. 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  May be false positive. Also on IV heparin  with multifocal PEs, need to monitor signs of bleeding very closely.  Bilateral multifocal PE Negative for DVT.  Etiology of this seems to be unprovoked.  Followed by hematology but no  known history of hypercoagulable states. Continue IV heparin , then DOAC once more stable.   Nonischemic cardiomyopathy with recovered EF Heart cath in 2019 with no evidence of CAD.  EF has recovered since May 2022.  EF here shows further improvement.  No shortness of breath, having peripheral edema that he reports has been chronic.  Rheumatoid arthritis Followed by [facility] for this, reports being this very severe.  May consider rheumatology follow eval if effusion becomes recurrent or felt to be primary driver of his effusion.  Chronic thrombocytopenia Continue to monitor this very closely.  Risk Assessment/Risk Scores:   [place]  Heart Association (NYHA) Functional Class NYHA Class I       For questions or updates, please contact [facility] Please consult [email or web address] for contact info under    Signed, Thom LITTIE Sluder, PA-C  08/02/2023 4:43 PM    I have seen and examined the patient along with Thom LITTIE Sluder, PA-C .  I have reviewed the chart, notes and new data.  I agree with PA/NP's note.  Key new complaints: He has a longstanding history of aggressive and complicated rheumatoid arthritis.  He also has an interesting history of transient decrease in left ventricular systolic function in 2019 with gradual improvement on therapy over the next 2 to 3 years.  LV function is now normal.  He has recent acute pleuritic chest pain but it is impossible to tell whether this is from pulmonary embolism/infarction or acute inflammatory pericarditis.  He is not particular short of breath.  He has bilateral lower extremity edema that occurs intermittently and was worse than usual was a month before the  current events, although it improved spontaneously, without particular intervention.  He attributes his edema to chronic steroid use.  He usually takes around 20 mg of prednisone  a day.  He has not taken any in the last 2 days.  He has not had dizziness or near syncope. Key examination  changes: His blood pressure is normal.  He does not have jugular venous distention.  His lungs are clear to auscultation.  He does not have a pericardial rub.  Rhythm is regular with occasional ectopic beats, borderline tachycardic in the 90s.  No murmurs are heard.  He has 2+ soft pitting edema bilaterally halfway up the shins. Key new findings / data: Reviewed his CT of his chest on his echocardiogram.  He has a large pericardial effusion that is circumferential.  Although there may be some enhanced respiratory flow variation across the AV valves and although in 1 single view there is a suggestion of intermittent right ventricular diastolic collapse with expiration, overall the study does not support full-blown tamponade.  In particular, the inferior vena cava collapses readily with inspiration.   Screening HIV serology is positive.  He has mild chronic thrombocytopenia attributed to his disease modifying biologicals for RA.  PLAN: Large pericardial effusion of unknown chronicity and etiology.  While this may represent acute pericarditis, it is also possible that his current symptoms are due to the pulmonary embolism and the pericardial effusion is subacute or chronic and related to his rheumatoid arthritis.  Previous history of LV dysfunction with normal coronary arteries is also interesting in this patient with autoimmune/connective tissue disease. No evidence of thoracic malignancy on the chest CT and no viral prodrome. Will treat empirically with prednisone  and colchicine.   Check ANA with reflex panel as he may have a overlap syndrome with lupus. Check specific HIV antibodies. Check inflammatory markers which we will use to monitor response to therapy.   Repeat echocardiogram in 48 hours.  Monitor closely for clinical signs of impending tamponade.   At this point there is no indication for pericardiocentesis and this would be a potentially risky endeavor in a patient that requires anticoagulation  for acute pulmonary embolism.  Nevertheless, we discussed the fact that he may need pericardiocentesis or even a surgical pericardial window if he develops overt tamponade.  Erinne Gillentine, MD, FACC [facility] [phone] 08/02/2023, 5:15 PM

## 2023-08-02 NOTE — Progress Notes (Signed)
 PROGRESS NOTE    Brendan [name]  FMW:[id number] DOB: [birth year] DOA: 08/01/2023 PCP: Bertell Satterfield, MD   Brief Narrative:    Brendan [name] is a 68 y.o. male with medical history significant for NICM/HFimpEF, rheumatoid arthritis, dyslipidemia, ascending aortic aneurysm, hypothyroidism, and thrombocytopenia who presented to the ED with complaints of chest pain that began around midnight last night.  Patient was admitted for acute bilateral multifocal PE and was started on heparin  drip.  Ultrasound of the lower extremities with no findings of DVT noted.  2D echocardiogram performed this morning demonstrating signs of large pericardial effusion as well as early tamponade.  Case discussed with cardiology with recommendations to transfer to [facility] and initiate colchicine and consider potential pericardiocentesis.  Assessment & Plan:   Principal Problem:   Acute pulmonary embolism (HCC) Active Problems:   NSTEMI (non-ST elevated myocardial infarction) (HCC)   Non-ischemic cardiomyopathy (HCC)   Hyperlipidemia   Thrombocytopenia (HCC)   GERD (gastroesophageal reflux disease)  Assessment and Plan:   Acute bilateral multifocal PE - Ultrasound of lower extremities without any findings of DVT - Continue heparin  drip - Continue to monitor closely on telemetry - Pulse oximetry monitoring - Appears unprovoked, will likely require hematology follow-up outpatient  Large pericardial effusion with possible early tamponade -Discussed case with Dr. 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   Consultants:  Discussed with cardiology Dr. Francyne  Procedures:  None  Antimicrobials:  None   Subjective: Patient seen and evaluated today and appears to have ongoing chest pain that is unrelenting.  Denies any shortness of breath or other complaints.  Objective: Vitals:   08/01/23 1533 08/01/23 1959 08/01/23 2323 08/02/23 0340  BP: 98/81 116/79 106/68 119/74  Pulse: 78 83 91 92  Resp: 18 16 13 14   Temp: 97.7 F (36.5 C) 98.6 F (37 C) 98.6 F (37 C) 98.7 F (37.1 C)  TempSrc: Oral Oral Oral Oral  SpO2: 94% 97% 94% 96%  Weight:      Height:        Intake/Output Summary (Last 24 hours) at 08/02/2023 1047 Last data filed at 08/02/2023 0946 Gross per 24 hour  Intake 1538.5 ml  Output --  Net 1538.5 ml   Filed Weights   08/01/23 1047  Weight: 104.3 kg    Examination:  General exam: Appears calm and comfortable  Respiratory system: Clear to auscultation. Respiratory effort normal. Cardiovascular system: Diminished heart sounds, RRR Gastrointestinal system: Abdomen is soft Central nervous system: Alert and awake Extremities: No edema Skin: No significant lesions noted Psychiatry: Flat affect.    Data Reviewed: I have personally reviewed following labs and imaging studies  CBC: Recent Labs  Lab 08/01/23 1111 08/02/23 0507  WBC 7.5 10.8*  NEUTROABS 6.1  --  HGB 13.0 13.7  HCT 39.0 40.8  MCV 94.4 96.9  PLT 141* 157   Basic Metabolic Panel: Recent Labs  Lab 08/01/23 1111 08/02/23 0507  NA 136 136  K 3.6 4.2  CL 102  102  CO2 23 23  GLUCOSE 187* 136*  BUN 13 14  CREATININE 0.72 0.86  CALCIUM  8.6* 8.6*  MG  --  2.0   GFR: Estimated Creatinine Clearance: 105.5 mL/min (by C-G formula based on SCr of 0.86 mg/dL). Liver Function Tests: Recent Labs  Lab 08/01/23 1111 08/02/23 0507  AST 14* 10*  ALT 17 15  ALKPHOS 38 35*  BILITOT 0.9 1.6*  PROT 6.3* 6.4*  ALBUMIN 3.0* 2.9*   No results for input(s): LIPASE, AMYLASE in the last 168 hours. No results for input(s): AMMONIA in the last 168 hours. Coagulation Profile: No results for input(s): INR, PROTIME in the last 168 hours. Cardiac Enzymes: No results for input(s): CKTOTAL, CKMB, CKMBINDEX, TROPONINI in the last 168 hours. BNP (last 3 results) No results for input(s): PROBNP in the last 8760 hours. HbA1C: No results for input(s): HGBA1C in the last 72 hours. CBG: No results for input(s): GLUCAP in the last 168 hours. Lipid Profile: No results for input(s): CHOL, HDL, LDLCALC, TRIG, CHOLHDL, LDLDIRECT in the last 72 hours. Thyroid  Function Tests: No results for input(s): TSH, T4TOTAL, FREET4, T3FREE, THYROIDAB in the last 72 hours. Anemia Panel: No results for input(s): VITAMINB12, FOLATE, FERRITIN, TIBC, IRON, RETICCTPCT in the last 72 hours. Sepsis Labs: No results for input(s): PROCALCITON, LATICACIDVEN in the last 168 hours.  No results found for this or any previous visit (from the past 240 hours).       Radiology Studies: ECHOCARDIOGRAM COMPLETE Result Date: 08/02/2023    ECHOCARDIOGRAM REPORT   Patient Name:   Brendan [name] Date of Exam: 08/02/2023 Medical Rec #:  [id number]        Height:       72.0 in Accession #:    [id number]       Weight:       230.0 lb Date of Birth:  03-10-1955        BSA:          2.261 m Patient Age:    68 years         BP:           119/74 mmHg Patient Gender: M                HR:           89 bpm. Exam Location:  [facility] Procedure: 2D  Echo, Cardiac Doppler and Color Doppler (Both Spectral and Color            Flow Doppler were utilized during procedure). REPORT CONTAINS CRITICAL RESULT Indications:    R07.9* Chest pain, unspecified  History:        Patient has prior history of Echocardiogram examinations, most                 recent 06/22/2020. Previous Myocardial Infarction and CAD,                 Signs/Symptoms:Chest Pain; Risk Factors:Dyslipidemia. Pulmonary                 embolii.  Sonographer:    Ellouise Mose RDCS Referring Phys: [id number] Brendan [name] [facility]  Sonographer Comments: Technically difficult study due to poor echo windows. Image acquisition challenging due to patient body habitus. High  fowler's position due to chest pain IMPRESSIONS  1. Left ventricular ejection fraction, by estimation, is 55 to 60%. The left ventricle has normal function. Left ventricular endocardial border not optimally defined to evaluate regional wall motion. There is mild concentric left ventricular hypertrophy. Left ventricular diastolic parameters are consistent with Grade I diastolic dysfunction (impaired relaxation).  2. Right ventricular systolic function is mildly reduced. The right ventricular size is normal. There is normal pulmonary artery systolic pressure. The estimated right ventricular systolic pressure is 24.0 mmHg.  3. Overall findings for tamponade are equivocal, but early tamponade is likely present. The inferior vena cava is only mildly dilated and collapses readily with inspiration. There is no tachycardia. However, there may be intermittent diastgolic right ventricular collapse and there is enhanced respiratory variation in mitral flow. Large pericardial effusion. The pericardial effusion is circumferential.  4. The mitral valve is normal in structure. No evidence of mitral valve regurgitation. No evidence of mitral stenosis.  5. The aortic valve is tricuspid. Aortic valve regurgitation is not visualized. No aortic stenosis is present.  6. Aortic  dilatation noted. There is mild dilatation of the ascending aorta, measuring 40 mm.  7. The inferior vena cava is dilated in size with >50% respiratory variability, suggesting right atrial pressure of 8 mmHg. Comparison(s): Prior images reviewed side by side. Changes from prior study are noted. The left ventricular function has improved. The large pericardial effusion is new. FINDINGS  Left Ventricle: Left ventricular ejection fraction, by estimation, is 55 to 60%. The left ventricle has normal function. Left ventricular endocardial border not optimally defined to evaluate regional wall motion. The left ventricular internal cavity size was normal in size. There is mild concentric left ventricular hypertrophy. Left ventricular diastolic parameters are consistent with Grade I diastolic dysfunction (impaired relaxation). Right Ventricle: The right ventricular size is normal. No increase in right ventricular wall thickness. Right ventricular systolic function is mildly reduced. There is normal pulmonary artery systolic pressure. The tricuspid regurgitant velocity is 2.00 m/s, and with an assumed right atrial pressure of 8 mmHg, the estimated right ventricular systolic pressure is 24.0 mmHg. Left Atrium: Left atrial size was normal in size. Right Atrium: Right atrial size was normal in size. Pericardium: Overall findings for tamponade are equivocal, but early tamponade is likely present. The inferior vena cava is only mildly dilated and collapses readily with inspiration. There is no tachycardia. However, there may be intermittent diastgolic  right ventricular collapse and there is enhanced respiratory variation in mitral flow. A large pericardial effusion is present. The pericardial effusion is circumferential. There is diastolic collapse of the right ventricular free wall and excessive respiratory variation in the mitral valve spectral Doppler velocities. Mitral Valve: The mitral valve is normal in structure. No  evidence of mitral valve regurgitation. No evidence of mitral valve stenosis. Tricuspid Valve: The tricuspid valve is normal in structure. Tricuspid valve regurgitation is mild . No evidence of tricuspid stenosis. Aortic Valve: The aortic valve is tricuspid. Aortic valve regurgitation is not visualized. No aortic stenosis is present. Pulmonic Valve: The pulmonic valve was normal in structure. Pulmonic valve regurgitation is not visualized. No evidence of pulmonic stenosis. Aorta: Aortic dilatation noted. There is mild dilatation of the ascending aorta, measuring 40 mm. Venous: The inferior vena cava is dilated in size with greater than 50% respiratory variability, suggesting right atrial pressure of 8 mmHg. IAS/Shunts: No atrial level shunt detected by color flow Doppler.  LEFT VENTRICLE PLAX 2D LVIDd:  5.00 cm      Diastology LVIDs:         4.20 cm      LV e' medial:    4.68 cm/s LV PW:         1.50 cm      LV E/e' medial:  10.8 LV IVS:        1.40 cm      LV e' lateral:   4.79 cm/s LVOT diam:     2.50 cm      LV E/e' lateral: 10.5 LV SV:         72 LV SV Index:   32 LVOT Area:     4.91 cm  LV Volumes (MOD) LV vol d, MOD A2C: 116.0 ml LV vol d, MOD A4C: 70.5 ml LV vol s, MOD A2C: 29.9 ml LV vol s, MOD A4C: 26.5 ml LV SV MOD A2C:     86.1 ml LV SV MOD A4C:     70.5 ml LV SV MOD BP:      63.1 ml RIGHT VENTRICLE             IVC RV S prime:     11.10 cm/s  IVC diam: 2.20 cm TAPSE (M-mode): 1.1 cm LEFT ATRIUM             Index        RIGHT ATRIUM           Index LA diam:        4.10 cm 1.81 cm/m   RA Area:     10.70 cm LA Vol (A2C):   25.5 ml 11.28 ml/m  RA Volume:   21.50 ml  9.51 ml/m LA Vol (A4C):   13.4 ml 5.93 ml/m LA Biplane Vol: 19.3 ml 8.54 ml/m  AORTIC VALVE LVOT Vmax:   97.10 cm/s LVOT Vmean:  62.300 cm/s LVOT VTI:    0.146 m  AORTA Ao Root diam: 3.60 cm Ao Asc diam:  4.05 cm MITRAL VALVE               TRICUSPID VALVE MV Area (PHT): 4.23 cm    TR Peak grad:   16.0 mmHg MV Decel Time: 179 msec     TR Vmax:        200.00 cm/s MV E velocity: 50.50 cm/s MV A velocity: 53.77 cm/s  SHUNTS MV E/A ratio:  0.94        Systemic VTI:  0.15 m                            Systemic Diam: 2.50 cm Mihai Croitoru MD Electronically signed by Jerel Balding MD Signature Date/Time: 08/02/2023/10:40:07 AM    Final    US  Venous Img Lower Bilateral (DVT) Result Date: 08/01/2023 CLINICAL DATA:  Scattered tiny bilateral pulmonary emboli, very low thrombus burden, leg pain EXAM: BILATERAL LOWER EXTREMITY VENOUS DOPPLER ULTRASOUND TECHNIQUE: Gray-scale sonography with graded compression, as well as color Doppler and duplex ultrasound were performed to evaluate the lower extremity deep venous systems from the level of the common femoral vein and including the common femoral, femoral, profunda femoral, popliteal and calf veins including the posterior tibial, peroneal and gastrocnemius veins when visible. Spectral Doppler was utilized to evaluate flow at rest and with distal augmentation maneuvers in the common femoral, femoral and popliteal veins. COMPARISON:  None Available. FINDINGS: RIGHT LOWER EXTREMITY Common Femoral Vein: No evidence of thrombus. Normal compressibility, respiratory phasicity  and response to augmentation. Saphenofemoral Junction: No evidence of thrombus. Normal compressibility and flow on color Doppler imaging. Profunda Femoral Vein: No evidence of thrombus. Normal compressibility and flow on color Doppler imaging. Femoral Vein: No evidence of thrombus. Normal compressibility, respiratory phasicity and response to augmentation. Popliteal Vein: No evidence of thrombus. Normal compressibility, respiratory phasicity and response to augmentation. Calf Veins: Limited visualization of the right posterior tibial peroneal veins. Difficult to exclude residual minor right calf region DVT. No propagation into the popliteal or femoral veins. LEFT LOWER EXTREMITY Common Femoral Vein: No evidence of thrombus. Normal  compressibility, respiratory phasicity and response to augmentation. Saphenofemoral Junction: No evidence of thrombus. Normal compressibility and flow on color Doppler imaging. Profunda Femoral Vein: No evidence of thrombus. Normal compressibility and flow on color Doppler imaging. Femoral Vein: No evidence of thrombus. Normal compressibility, respiratory phasicity and response to augmentation. Popliteal Vein: No evidence of thrombus. Normal compressibility, respiratory phasicity and response to augmentation. Calf Veins: No evidence of thrombus. Normal compressibility and flow on color Doppler imaging. IMPRESSION: 1. No evidence of significant acute or occlusive lower extremity DVT. 2. Limited visualization of the right posterior tibial and peroneal veins. Difficult to exclude residual minor right calf region DVT. No propagation into the popliteal or femoral veins. Electronically Signed   By: CHRISTELLA.  Shick M.D.   On: 08/01/2023 15:00   CT Angio Chest PE W/Cm &/Or Wo Cm Result Date: 08/01/2023 CLINICAL DATA:  Acute onset pleuritic chest pain EXAM: CT ANGIOGRAPHY CHEST WITH CONTRAST TECHNIQUE: Multidetector CT imaging of the chest was performed using the standard protocol during bolus administration of intravenous contrast. Multiplanar CT image reconstructions and MIPs were obtained to evaluate the vascular anatomy. RADIATION DOSE REDUCTION: This exam was performed according to the departmental dose-optimization program which includes automated exposure control, adjustment of the mA and/or kV according to patient size and/or use of iterative reconstruction technique. CONTRAST:  75mL OMNIPAQUE  IOHEXOL  350 MG/ML SOLN COMPARISON:  Same day chest radiograph, CTA chest dated 08/19/2022, 07/30/2021 FINDINGS: Cardiovascular: The study is high quality for the evaluation of pulmonary embolism. Multifocal bilateral filling defects involving the right upper, middle, and lower lobes as well as lingula, most proximally within the  right lower lobe proximal segmental pulmonary artery (5:161). Great vessels are normal in course and caliber. RV: LV ratio less than 1. Trace pericardial effusion. Coronary artery calcifications and aortic atherosclerosis. Mediastinum/Nodes: Imaged thyroid  gland without nodules meeting criteria for imaging follow-up by size. Normal esophagus. No pathologically enlarged axillary, supraclavicular, mediastinal, or hilar lymph nodes. Lungs/Pleura: The central airways are patent. Trace secretions within the trachea. Mild diffuse bronchial wall thickening. Mild paraseptal emphysema. Posterior right apical 5 x 4 mm nodule (6:43), basilar right lower lobe 3 mm nodule (6:85), 2 mm left upper lobe nodule (6:62), and peripheral 2 mm left lower lobe nodule (6:107), as well as 3 mm left lower lobe perifissural nodules (6:72) are unchanged dating back to 07/30/2021, likely benign. No specific follow-up imaging recommended. No pneumothorax. No pleural effusion. Upper abdomen: Normal. Musculoskeletal: No acute or abnormal lytic or blastic osseous lesions. Multilevel degenerative changes of the thoracic spine. Review of the MIP images confirms the above findings. IMPRESSION: 1. Multifocal bilateral pulmonary emboli involving the right upper, middle, and lower lobes as well as lingula, most proximally within the right lower lobe proximal segmental pulmonary artery. No evidence of right heart strain. 2. Mild peribronchial wall thickening, which may represent bronchitis. 3. Aortic Atherosclerosis (ICD10-I70.0) and Emphysema (ICD10-J43.9). Coronary artery calcifications. Assessment  for potential risk factor modification, dietary therapy or pharmacologic therapy may be warranted, if clinically indicated. Critical Value/emergent results were called by telephone at the time of interpretation on 08/01/2023 at 1:40 pm to Dr. Zammit, who verbally acknowledged these results. Electronically Signed   By: Limin  Xu M.D.   On: 08/01/2023 13:43    DG Chest Port 1 View Result Date: 08/01/2023 CLINICAL DATA:  Chest pain EXAM: PORTABLE CHEST 1 VIEW COMPARISON:  X-ray 01/15/2018.  CTA chest 08/19/2022 FINDINGS: Underinflation. Calcified aorta. Borderline cardiopericardial silhouette. No consolidation, pneumothorax or effusion. No edema. Overlapping cardiac leads. Degenerative changes along the spine. IMPRESSION: Underinflation. Borderline size heart. No acute cardiopulmonary disease. Electronically Signed   By: Ranell Bring M.D.   On: 08/01/2023 11:33        Scheduled Meds:  atorvastatin   10 mg Oral Daily   carvedilol   3.125 mg Oral BID WC   levothyroxine   50 mcg Oral QAC breakfast   pantoprazole   40 mg Oral Daily   Continuous Infusions:  heparin  1,450 Units/hr (08/02/23 0357)     LOS: 0 days    Time spent: 55 minutes    Champion Corales D Maree, DO [facility]  If 7PM-7AM, please contact night-coverage [email or web address] 08/02/2023, 10:47 AM

## 2023-08-02 NOTE — Plan of Care (Signed)
   Problem: Education: Goal: Knowledge of General Education information will improve Description: Including pain rating scale, medication(s)/side effects and non-pharmacologic comfort measures Outcome: Progressing   Problem: Clinical Measurements: Goal: Will remain free from infection Outcome: Progressing   Problem: Clinical Measurements: Goal: Diagnostic test results will improve Outcome: Progressing

## 2023-08-02 NOTE — Progress Notes (Signed)
 PHARMACY - ANTICOAGULATION CONSULT NOTE  Pharmacy Consult for heparin  Indication: pulmonary embolus  Allergies  Allergen Reactions   Simponi [Golimumab]     Itching, rash    Patient Measurements: Height: 6' (182.9 cm) Weight: 104.3 kg (230 lb) IBW/kg (Calculated) : 77.6 HEPARIN  DW (KG): 99.2  Vital Signs: Temp: 98.7 F (37.1 C) (06/28 0340) Temp Source: Oral (06/28 0340) BP: 119/74 (06/28 0340) Pulse Rate: 92 (06/28 0340)  Labs: Recent Labs    08/01/23 1111 08/01/23 1235 08/01/23 2105 08/02/23 0507  HGB 13.0  --   --  13.7  HCT 39.0  --   --  40.8  PLT 141*  --   --  157  HEPARINUNFRC  --   --  0.56 0.49  CREATININE 0.72  --   --  0.86  TROPONINIHS 5 4  --   --     Estimated Creatinine Clearance: 105.5 mL/min (by C-G formula based on SCr of 0.86 mg/dL).   Medical History: Past Medical History:  Diagnosis Date   CHF (congestive heart failure) (HCC)    a. EF 25-30% in 01/2018 with cath showing no significant CAD. b.  EF 35-40% in 04/2018 c. at 40-45% in 01/2019 e. EF at 50% in 06/2020   GERD (gastroesophageal reflux disease)    Hypothyroidism    Myocardial infarction (HCC)    Osteoarthritis    Rheumatoid arthritis (HCC)    Rheumatoid arthritis (HCC)     Medications:  Medications Prior to Admission  Medication Sig Dispense Refill Last Dose/Taking   atorvastatin  (LIPITOR) 10 MG tablet TAKE 1 TABLET BY MOUTH EVERY DAY 90 tablet 3 07/31/2023   carvedilol  (COREG ) 3.125 MG tablet TAKE 1 TABLET BY MOUTH EVERY MORNING AND TAKE 2 TABLETS EVERY EVENING 270 tablet 3 08/01/2023   clobetasol (TEMOVATE) 0.05 % external solution Apply 1 application topically 2 (two) times daily as needed (to affected areas of scalp).    Unknown   Coenzyme Q10 (Q-10 CO-ENZYME PO) Take 1 tablet by mouth daily.   07/31/2023   Cyanocobalamin  (VITAMIN B 12 PO) Take 1 tablet by mouth daily.   07/31/2023   etanercept (ENBREL) 50 MG/ML injection Inject 50 mg into the skin once a week.   07/29/2023    HYDROcodone -acetaminophen  (NORCO/VICODIN) 5-325 MG tablet Take 1 tablet by mouth every 12 (twelve) hours as needed for moderate pain.   07/31/2023   levothyroxine  (SYNTHROID ) 50 MCG tablet Take 50 mcg by mouth daily before breakfast.   08/01/2023   MEGARED OMEGA-3 KRILL OIL 500 MG CAPS Take 1 tablet by mouth daily.   07/31/2023   nitroGLYCERIN  (NITROSTAT ) 0.4 MG SL tablet Place 1 tablet (0.4 mg total) under the tongue every 5 (five) minutes x 3 doses as needed for chest pain. 25 tablet 0 08/01/2023   pantoprazole  (PROTONIX ) 40 MG tablet TAKE 1 TABLET BY MOUTH EVERY DAY 90 tablet 3 07/31/2023   predniSONE  (DELTASONE ) 10 MG tablet Take 10-40 mg by mouth daily as needed ([place]).   07/31/2023   sacubitril -valsartan  (ENTRESTO ) 97-103 MG Take 1 tablet by mouth 2 (two) times daily. 60 tablet 6 08/01/2023   VITAMIN D  PO Take 1 tablet by mouth daily.   07/31/2023    Assessment: Pharmacy consulted to dose heparin  in patient with pulmonary embolism.  Patient is not on anticoagulation prior to admission.  HL 0.49- therapeutic CBC WNL D-Dimer 3.89  Goal of Therapy:  Heparin  level 0.3-0.7 units/ml Monitor platelets by anticoagulation protocol: Yes     Plan:  Continue  heparin  infusion at 1450 units/hr Check anti-Xa level daily Continue to monitor H&H and platelets  Elspeth Sour, PharmD Clinical Pharmacist 08/02/2023 8:01 AM

## 2023-08-02 NOTE — Progress Notes (Signed)
  Echocardiogram 2D Echocardiogram has been performed.  Devora Ellouise SAUNDERS 08/02/2023, 9:28 AM

## 2023-08-02 NOTE — Progress Notes (Signed)
 Patient arrived at the unit via  a [facility] ,upon arrival pt is alert and oriented X4.chg bath given,Vitals taken,CCMD notified, admissions notified,pt oriented to the unit,call bell in reach

## 2023-08-03 DIAGNOSIS — I5032 Chronic diastolic (congestive) heart failure: Secondary | ICD-10-CM | POA: Diagnosis not present

## 2023-08-03 DIAGNOSIS — I2699 Other pulmonary embolism without acute cor pulmonale: Secondary | ICD-10-CM | POA: Diagnosis not present

## 2023-08-03 DIAGNOSIS — I3139 Other pericardial effusion (noninflammatory): Secondary | ICD-10-CM

## 2023-08-03 DIAGNOSIS — I4891 Unspecified atrial fibrillation: Secondary | ICD-10-CM

## 2023-08-03 LAB — BASIC METABOLIC PANEL WITH GFR
Anion gap: 13 (ref 5–15)
BUN: 12 mg/dL (ref 8–23)
CO2: 19 mmol/L — ABNORMAL LOW (ref 22–32)
Calcium: 8.5 mg/dL — ABNORMAL LOW (ref 8.9–10.3)
Chloride: 101 mmol/L (ref 98–111)
Creatinine, Ser: 0.83 mg/dL (ref 0.61–1.24)
GFR, Estimated: >60 mL/min (ref >60)
Glucose, Bld: 152 mg/dL — ABNORMAL HIGH (ref 70–99)
Potassium: 4.5 mmol/L (ref 3.5–5.1)
Sodium: 133 mmol/L — ABNORMAL LOW (ref 135–145)

## 2023-08-03 LAB — CBC
HCT: 35.4 % — ABNORMAL LOW (ref 39.0–52.0)
Hemoglobin: 11.9 g/dL — ABNORMAL LOW (ref 13.0–17.0)
MCH: 32 pg (ref 26.0–34.0)
MCHC: 33.6 g/dL (ref 30.0–36.0)
MCV: 95.2 fL (ref 80.0–100.0)
Platelets: 129 10*3/uL — ABNORMAL LOW (ref 150–400)
RBC: 3.72 MIL/uL — ABNORMAL LOW (ref 4.22–5.81)
RDW: 12.2 % (ref 11.5–15.5)
WBC: 8.8 10*3/uL (ref 4.0–10.5)
nRBC: 0 % (ref 0.0–0.2)

## 2023-08-03 LAB — MAGNESIUM: Magnesium: 2.2 mg/dL (ref 1.7–2.4)

## 2023-08-03 LAB — HEPARIN LEVEL (UNFRACTIONATED)
Heparin Unfractionated: 0.15 [IU]/mL — ABNORMAL LOW (ref 0.30–0.70)
Heparin Unfractionated: 0.1 [IU]/mL — ABNORMAL LOW (ref 0.30–0.70)
Heparin Unfractionated: 0.27 [IU]/mL — ABNORMAL LOW (ref 0.30–0.70)

## 2023-08-03 MED ORDER — METOPROLOL TARTRATE 25 MG PO TABS
25.0000 mg | ORAL_TABLET | Freq: Three times a day (TID) | ORAL | Status: DC
  Administered 2023-08-03 – 2023-08-04 (×2): 25 mg via ORAL
  Filled 2023-08-03 (×2): qty 1

## 2023-08-03 MED ORDER — HEPARIN BOLUS VIA INFUSION
2000.0000 [IU] | Freq: Once | INTRAVENOUS | Status: AC
  Administered 2023-08-03: 2000 [IU] via INTRAVENOUS

## 2023-08-03 MED ORDER — HEPARIN BOLUS VIA INFUSION
4000.0000 [IU] | Freq: Once | INTRAVENOUS | Status: AC
  Administered 2023-08-03: 4000 [IU] via INTRAVENOUS

## 2023-08-03 NOTE — Plan of Care (Signed)
   Problem: Health Behavior/Discharge Planning: Goal: Ability to manage health-related needs will improve Outcome: Progressing   Problem: Clinical Measurements: Goal: Ability to maintain clinical measurements within normal limits will improve Outcome: Progressing Goal: Will remain free from infection Outcome: Progressing Goal: Diagnostic test results will improve Outcome: Progressing Goal: Respiratory complications will improve Outcome: Progressing Goal: Cardiovascular complication will be avoided Outcome: Progressing   Problem: Education: Goal: Knowledge of General Education information will improve Description: Including pain rating scale, medication(s)/side effects and non-pharmacologic comfort measures Outcome: Progressing

## 2023-08-03 NOTE — Progress Notes (Signed)
 Rounding Note   Patient Name: Brendan [name] Date of Encounter: 08/03/2023  [facility] Cardiologist: Alvan Carrier, MD   Subjective Chest pain has resolved, no SOB  Scheduled Meds:  atorvastatin   10 mg Oral Daily   colchicine  0.6 mg Oral BID   levothyroxine   50 mcg Oral QAC breakfast   pantoprazole   40 mg Oral Daily   predniSONE   20 mg Oral Q breakfast   Continuous Infusions:  heparin  1,450 Units/hr (08/02/23 2149)   PRN Meds: acetaminophen  **OR** acetaminophen , HYDROcodone -acetaminophen , HYDROmorphone  (DILAUDID ) injection, nitroGLYCERIN , ondansetron  **OR** ondansetron  (ZOFRAN ) IV   Vital Signs  Vitals:   08/02/23 1550 08/02/23 2000 08/02/23 2355 08/03/23 0418  BP: 122/82 117/62 112/74 131/65  Pulse: 95 91 80 79  Resp: 16  16 18   Temp: 98.7 F (37.1 C) 98.8 F (37.1 C) (!) 97.5 F (36.4 C) 98.2 F (36.8 C)  TempSrc: Oral Oral Oral Oral  SpO2: 100% 95% 96% 96%  Weight:      Height:        Intake/Output Summary (Last 24 hours) at 08/03/2023 0759 Last data filed at 08/03/2023 0600 Gross per 24 hour  Intake 1049.75 ml  Output --  Net 1049.75 ml      08/01/2023   10:47 AM 12/20/2022    2:27 PM 12/03/2022    2:56 PM  Last 3 Weights  Weight (lbs) 230 lb 237 lb 239 lb  Weight (kg) 104.327 kg 107.502 kg 108.41 kg      Telemetry NSR - Personally Reviewed  ECG  N/a - Personally Reviewed  Physical Exam  GEN: No acute distress.   Neck: No JVD Cardiac: RRR, no murmurs, rubs, or gallops.  Respiratory: Clear to auscultation bilaterally. GI: Soft, nontender, non-distended  MS: No edema; No deformity. Neuro:  Nonfocal  Psych: Normal affect   Labs High Sensitivity Troponin:   Recent Labs  Lab 08/01/23 1111 08/01/23 1235  TROPONINIHS 5 4     Chemistry Recent Labs  Lab 08/01/23 1111 08/02/23 0507 08/03/23 0319  NA 136 136 133*  K 3.6 4.2 4.5  CL 102 102 101  CO2 23 23 19*  GLUCOSE 187* 136* 152*  BUN 13 14 12   CREATININE 0.72  0.86 0.83  CALCIUM  8.6* 8.6* 8.5*  MG  --  2.0 2.2  PROT 6.3* 6.4*  --   ALBUMIN 3.0* 2.9*  --   AST 14* 10*  --   ALT 17 15  --   ALKPHOS 38 35*  --   BILITOT 0.9 1.6*  --   GFRNONAA >60 >60 >60  ANIONGAP 11 11 13     Lipids No results for input(s): CHOL, TRIG, HDL, LABVLDL, LDLCALC, CHOLHDL in the last 168 hours.  Hematology Recent Labs  Lab 08/01/23 1111 08/02/23 0507 08/03/23 0319  WBC 7.5 10.8* 8.8  RBC 4.13* 4.21* 3.72*  HGB 13.0 13.7 11.9*  HCT 39.0 40.8 35.4*  MCV 94.4 96.9 95.2  MCH 31.5 32.5 32.0  MCHC 33.3 33.6 33.6  RDW 12.5 12.8 12.2  PLT 141* 157 129*   Thyroid  No results for input(s): TSH, FREET4 in the last 168 hours.  BNP Recent Labs  Lab 08/01/23 1116  BNP 56.0    DDimer  Recent Labs  Lab 08/01/23 1111  DDIMER 3.89*     Radiology  ECHOCARDIOGRAM COMPLETE Result Date: 08/02/2023    ECHOCARDIOGRAM REPORT   Patient Name:   Brendan [name] Date of Exam: 08/02/2023 Medical Rec #:  [id number]  Height:       72.0 in Accession #:    [id number]       Weight:       230.0 lb Date of Birth:  [birth year]        BSA:          2.261 m Patient Age:    68 years         BP:           119/74 mmHg Patient Gender: M                HR:           89 bpm. Exam Location:  [facility] Procedure: 2D Echo, Cardiac Doppler and Color Doppler (Both Spectral and Color            Flow Doppler were utilized during procedure). REPORT CONTAINS CRITICAL RESULT Indications:    R07.9* Chest pain, unspecified  History:        Patient has prior history of Echocardiogram examinations, most                 recent 06/22/2020. Previous Myocardial Infarction and CAD,                 Signs/Symptoms:Chest Pain; Risk Factors:Dyslipidemia. Pulmonary                 embolii.  Sonographer:    Ellouise Mose RDCS Referring Phys: [id number] ADRON BIRCH [facility]  Sonographer Comments: Technically difficult study due to poor echo windows. Image acquisition challenging due to patient body habitus. High  fowler's position due to chest pain IMPRESSIONS  1. Left ventricular ejection fraction, by estimation, is 55 to 60%. The left ventricle has normal function. Left ventricular endocardial border not optimally defined to evaluate regional wall motion. There is mild concentric left ventricular hypertrophy. Left ventricular diastolic parameters are consistent with Grade I diastolic dysfunction (impaired relaxation).  2. Right ventricular systolic function is mildly reduced. The right ventricular size is normal. There is normal pulmonary artery systolic pressure. The estimated right ventricular systolic pressure is 24.0 mmHg.  3. Overall findings for tamponade are equivocal, but early tamponade is likely present. The inferior vena cava is only mildly dilated and collapses readily with inspiration. There is no tachycardia. However, there may be intermittent diastgolic right ventricular collapse and there is enhanced respiratory variation in mitral flow. Large pericardial effusion. The pericardial effusion is circumferential.  4. The mitral valve is normal in structure. No evidence of mitral valve regurgitation. No evidence of mitral stenosis.  5. The aortic valve is tricuspid. Aortic valve regurgitation is not visualized. No aortic stenosis is present.  6. Aortic dilatation noted. There is mild dilatation of the ascending aorta, measuring 40 mm.  7. The inferior vena cava is dilated in size with >50% respiratory variability, suggesting right atrial pressure of 8 mmHg. Comparison(s): Prior images reviewed side by side. Changes from prior study are noted. The left ventricular function has improved. The large pericardial effusion is new. FINDINGS  Left Ventricle: Left ventricular ejection fraction, by estimation, is 55 to 60%. The left ventricle has normal function. Left ventricular endocardial border not optimally defined to evaluate regional wall motion. The left ventricular internal cavity size was normal in size. There is  mild concentric left ventricular hypertrophy. Left ventricular diastolic parameters are consistent with Grade I diastolic dysfunction (impaired relaxation). Right Ventricle: The right ventricular size is normal. No increase in right ventricular wall thickness. Right ventricular systolic function is  mildly reduced. There is normal pulmonary artery systolic pressure. The tricuspid regurgitant velocity is 2.00 m/s, and with an assumed right atrial pressure of 8 mmHg, the estimated right ventricular systolic pressure is 24.0 mmHg. Left Atrium: Left atrial size was normal in size. Right Atrium: Right atrial size was normal in size. Pericardium: Overall findings for tamponade are equivocal, but early tamponade is likely present. The inferior vena cava is only mildly dilated and collapses readily with inspiration. There is no tachycardia. However, there may be intermittent diastgolic  right ventricular collapse and there is enhanced respiratory variation in mitral flow. A large pericardial effusion is present. The pericardial effusion is circumferential. There is diastolic collapse of the right ventricular free wall and excessive respiratory variation in the mitral valve spectral Doppler velocities. Mitral Valve: The mitral valve is normal in structure. No evidence of mitral valve regurgitation. No evidence of mitral valve stenosis. Tricuspid Valve: The tricuspid valve is normal in structure. Tricuspid valve regurgitation is mild . No evidence of tricuspid stenosis. Aortic Valve: The aortic valve is tricuspid. Aortic valve regurgitation is not visualized. No aortic stenosis is present. Pulmonic Valve: The pulmonic valve was normal in structure. Pulmonic valve regurgitation is not visualized. No evidence of pulmonic stenosis. Aorta: Aortic dilatation noted. There is mild dilatation of the ascending aorta, measuring 40 mm. Venous: The inferior vena cava is dilated in size with greater than 50% respiratory variability,  suggesting right atrial pressure of 8 mmHg. IAS/Shunts: No atrial level shunt detected by color flow Doppler.  LEFT VENTRICLE PLAX 2D LVIDd:         5.00 cm      Diastology LVIDs:         4.20 cm      LV e' medial:    4.68 cm/s LV PW:         1.50 cm      LV E/e' medial:  10.8 LV IVS:        1.40 cm      LV e' lateral:   4.79 cm/s LVOT diam:     2.50 cm      LV E/e' lateral: 10.5 LV SV:         72 LV SV Index:   32 LVOT Area:     4.91 cm  LV Volumes (MOD) LV vol d, MOD A2C: 116.0 ml LV vol d, MOD A4C: 70.5 ml LV vol s, MOD A2C: 29.9 ml LV vol s, MOD A4C: 26.5 ml LV SV MOD A2C:     86.1 ml LV SV MOD A4C:     70.5 ml LV SV MOD BP:      63.1 ml RIGHT VENTRICLE             IVC RV S prime:     11.10 cm/s  IVC diam: 2.20 cm TAPSE (M-mode): 1.1 cm LEFT ATRIUM             Index        RIGHT ATRIUM           Index LA diam:        4.10 cm 1.81 cm/m   RA Area:     10.70 cm LA Vol (A2C):   25.5 ml 11.28 ml/m  RA Volume:   21.50 ml  9.51 ml/m LA Vol (A4C):   13.4 ml 5.93 ml/m LA Biplane Vol: 19.3 ml 8.54 ml/m  AORTIC VALVE LVOT Vmax:   97.10 cm/s LVOT Vmean:  62.300 cm/s LVOT VTI:  0.146 m  AORTA Ao Root diam: 3.60 cm Ao Asc diam:  4.05 cm MITRAL VALVE               TRICUSPID VALVE MV Area (PHT): 4.23 cm    TR Peak grad:   16.0 mmHg MV Decel Time: 179 msec    TR Vmax:        200.00 cm/s MV E velocity: 50.50 cm/s MV A velocity: 53.77 cm/s  SHUNTS MV E/A ratio:  0.94        Systemic VTI:  0.15 m                            Systemic Diam: 2.50 cm Mihai Croitoru MD Electronically signed by Jerel Balding MD Signature Date/Time: 08/02/2023/10:40:07 AM    Final    US  Venous Img Lower Bilateral (DVT) Result Date: 08/01/2023 CLINICAL DATA:  Scattered tiny bilateral pulmonary emboli, very low thrombus burden, leg pain EXAM: BILATERAL LOWER EXTREMITY VENOUS DOPPLER ULTRASOUND TECHNIQUE: Gray-scale sonography with graded compression, as well as color Doppler and duplex ultrasound were performed to evaluate the lower extremity  deep venous systems from the level of the common femoral vein and including the common femoral, femoral, profunda femoral, popliteal and calf veins including the posterior tibial, peroneal and gastrocnemius veins when visible. Spectral Doppler was utilized to evaluate flow at rest and with distal augmentation maneuvers in the common femoral, femoral and popliteal veins. COMPARISON:  None Available. FINDINGS: RIGHT LOWER EXTREMITY Common Femoral Vein: No evidence of thrombus. Normal compressibility, respiratory phasicity and response to augmentation. Saphenofemoral Junction: No evidence of thrombus. Normal compressibility and flow on color Doppler imaging. Profunda Femoral Vein: No evidence of thrombus. Normal compressibility and flow on color Doppler imaging. Femoral Vein: No evidence of thrombus. Normal compressibility, respiratory phasicity and response to augmentation. Popliteal Vein: No evidence of thrombus. Normal compressibility, respiratory phasicity and response to augmentation. Calf Veins: Limited visualization of the right posterior tibial peroneal veins. Difficult to exclude residual minor right calf region DVT. No propagation into the popliteal or femoral veins. LEFT LOWER EXTREMITY Common Femoral Vein: No evidence of thrombus. Normal compressibility, respiratory phasicity and response to augmentation. Saphenofemoral Junction: No evidence of thrombus. Normal compressibility and flow on color Doppler imaging. Profunda Femoral Vein: No evidence of thrombus. Normal compressibility and flow on color Doppler imaging. Femoral Vein: No evidence of thrombus. Normal compressibility, respiratory phasicity and response to augmentation. Popliteal Vein: No evidence of thrombus. Normal compressibility, respiratory phasicity and response to augmentation. Calf Veins: No evidence of thrombus. Normal compressibility and flow on color Doppler imaging. IMPRESSION: 1. No evidence of significant acute or occlusive lower  extremity DVT. 2. Limited visualization of the right posterior tibial and peroneal veins. Difficult to exclude residual minor right calf region DVT. No propagation into the popliteal or femoral veins. Electronically Signed   By: CHRISTELLA.  Shick M.D.   On: 08/01/2023 15:00   CT Angio Chest PE W/Cm &/Or Wo Cm Result Date: 08/01/2023 CLINICAL DATA:  Acute onset pleuritic chest pain EXAM: CT ANGIOGRAPHY CHEST WITH CONTRAST TECHNIQUE: Multidetector CT imaging of the chest was performed using the standard protocol during bolus administration of intravenous contrast. Multiplanar CT image reconstructions and MIPs were obtained to evaluate the vascular anatomy. RADIATION DOSE REDUCTION: This exam was performed according to the departmental dose-optimization program which includes automated exposure control, adjustment of the mA and/or kV according to patient size and/or use of iterative reconstruction technique. CONTRAST:  75mL OMNIPAQUE  IOHEXOL  350 MG/ML SOLN COMPARISON:  Same day chest radiograph, CTA chest dated 08/19/2022, 07/30/2021 FINDINGS: Cardiovascular: The study is high quality for the evaluation of pulmonary embolism. Multifocal bilateral filling defects involving the right upper, middle, and lower lobes as well as lingula, most proximally within the right lower lobe proximal segmental pulmonary artery (5:161). Great vessels are normal in course and caliber. RV: LV ratio less than 1. Trace pericardial effusion. Coronary artery calcifications and aortic atherosclerosis. Mediastinum/Nodes: Imaged thyroid  gland without nodules meeting criteria for imaging follow-up by size. Normal esophagus. No pathologically enlarged axillary, supraclavicular, mediastinal, or hilar lymph nodes. Lungs/Pleura: The central airways are patent. Trace secretions within the trachea. Mild diffuse bronchial wall thickening. Mild paraseptal emphysema. Posterior right apical 5 x 4 mm nodule (6:43), basilar right lower lobe 3 mm nodule (6:85), 2  mm left upper lobe nodule (6:62), and peripheral 2 mm left lower lobe nodule (6:107), as well as 3 mm left lower lobe perifissural nodules (6:72) are unchanged dating back to 07/30/2021, likely benign. No specific follow-up imaging recommended. No pneumothorax. No pleural effusion. Upper abdomen: Normal. Musculoskeletal: No acute or abnormal lytic or blastic osseous lesions. Multilevel degenerative changes of the thoracic spine. Review of the MIP images confirms the above findings. IMPRESSION: 1. Multifocal bilateral pulmonary emboli involving the right upper, middle, and lower lobes as well as lingula, most proximally within the right lower lobe proximal segmental pulmonary artery. No evidence of right heart strain. 2. Mild peribronchial wall thickening, which may represent bronchitis. 3. Aortic Atherosclerosis (ICD10-I70.0) and Emphysema (ICD10-J43.9). Coronary artery calcifications. Assessment for potential risk factor modification, dietary therapy or pharmacologic therapy may be warranted, if clinically indicated. Critical Value/emergent results were called by telephone at the time of interpretation on 08/01/2023 at 1:40 pm to Dr. Zammit, who verbally acknowledged these results. Electronically Signed   By: Limin  Xu M.D.   On: 08/01/2023 13:43   DG Chest Port 1 View Result Date: 08/01/2023 CLINICAL DATA:  Chest pain EXAM: PORTABLE CHEST 1 VIEW COMPARISON:  X-ray 01/15/2018.  CTA chest 08/19/2022 FINDINGS: Underinflation. Calcified aorta. Borderline cardiopericardial silhouette. No consolidation, pneumothorax or effusion. No edema. Overlapping cardiac leads. Degenerative changes along the spine. IMPRESSION: Underinflation. Borderline size heart. No acute cardiopulmonary disease. Electronically Signed   By: Ranell Bring M.D.   On: 08/01/2023 11:33     Patient Profile   Brendan [name] is a 68 y.o. male with a hx of nonischemic cardiomyopathy with recovered EF, severe rheumatoid arthritis, hyperlipidemia,  ascending aortic aneurysm, chronic thrombocytopenia, hypothyroidism who is being seen 08/02/2023 for the evaluation of pericardial effusion with early signs of tamponade at the request of transferred from [facility].    Assessment & Plan   1.Pericardial effusion - 08/02/23 echo: LVEF 55-60%, grade I dd, mild RV dysfunction, large pericardial effusion. IVC collapses with inspiration, increased mitral inflow respiratory variation, questionable intermittent diastolic RV changes. Overall equivocal findings.  - clinically has not been tachycardic or hypotensive.  - on emperic treatment for possible inflammatory effusion with colchicine, has been on prednisone  at home for RA history.  - ESR 64, CRP 31.8. ANA pending - no malignancy noted on CT imaging. Does have long history of rheumatoid arthritis - difficult to assess if he chest pain worst with deep breathing and laying flat on admission is related to his PE or possibly acute inflammatory pericardial process - plan to repeat limited echo 6/30 to reassess effusion   2. Pulmonary embolism - diagnosed on admission,  on anticoagulation per primary team. - echo without significant RV strain.   3. HFimpEF - - 01/2018 echo LVEF 25-30% - 01/15/2018 cardiac cath showed no CAD - 06/2020 echo LVEF 50%  - 08/02/23 echo: LVEF 55-60%,  - no acute issues For questions or updates, please contact [facility] Please consult [email or web address] for contact info under     Signed, Alvan Carrier, MD  08/03/2023, 7:59 AM

## 2023-08-03 NOTE — Progress Notes (Signed)
 I was informed by RN that the patient went into A-fib RVR, heart rate in 120-130s which is new for him.  Patient is already anticoagulated with heparin  for bilateral PE  . Echo 6/28 shows significant large pericardial effusion.  And equivocal features however hemodynamically he was stable.  Recommendations. Will start him on metoprolol  25 mg 3 times daily. Continue telemetry. Continue telemetry Continue to monitor hemodynamics closely. Repeat limited echo tomorrow 6/30   Call if hypotensive or new changes  [facility] coverage

## 2023-08-03 NOTE — Progress Notes (Signed)
 PHARMACY - ANTICOAGULATION CONSULT NOTE  Pharmacy Consult for heparin  Indication: pulmonary embolus  Allergies  Allergen Reactions   Simponi [Golimumab]     Itching, rash    Patient Measurements: Height: 6' (182.9 cm) Weight: 104.3 kg (230 lb) IBW/kg (Calculated) : 77.6 HEPARIN  DW (KG): 99.2  Vital Signs: Temp: 98.3 F (36.8 C) (06/29 1931) Temp Source: Oral (06/29 1931) BP: 106/73 (06/29 1931) Pulse Rate: 114 (06/29 1931)  Labs: Recent Labs    08/01/23 1111 08/01/23 1235 08/01/23 2105 08/02/23 0507 08/03/23 0319 08/03/23 1111 08/03/23 2037  HGB 13.0  --   --  13.7 11.9*  --   --   HCT 39.0  --   --  40.8 35.4*  --   --   PLT 141*  --   --  157 129*  --   --   HEPARINUNFRC  --   --    < > 0.49 0.15* 0.10* 0.27*  CREATININE 0.72  --   --  0.86 0.83  --   --   TROPONINIHS 5 4  --   --   --   --   --    < > = values in this interval not displayed.    Estimated Creatinine Clearance: 109.3 mL/min (by C-G formula based on SCr of 0.83 mg/dL).   Assessment: 68 YO male with medical history significant for HFpEF, ascending aortic aneurysm, and thrombocytopenia who presented to the ED with chest pain. Found to have acute bilateral multifocal PEs and TTE demonstrating large pericardial effusion with signs of early tamponade. Pharmacy consulted to dose heparin . Patient is not on anticoagulation prior to admission.   Heparin  levels previously therapeutic x2 on 1450 units/hr. AM heparin  level down. Per patient, the line 'kinked' overnight. RN checked line, currently running with no issues. Repeat heparin  level < 0.1. Discussed transitioning to enoxaparin  with primary and cardiology teams. Holding off on transition - per cardiology, patient may need a pericardiocentesis at some point and would be better to remain on heparin  for now.  Heparin  level 0.33 this AM after rate increase to 1750 units/hr.  No issues per RN.   Goal of Therapy:  Heparin  level 0.3-0.7 units/ml Monitor  platelets by anticoagulation protocol: Yes   Plan:   Continue heparin  infusion at 1750 units/hr Check confirmatory heparin  level in 6 hours and daily while on heparin  Continue to monitor H&H and platelets  Dwayne A. Lyle, PharmD, BCPS, FNKF Clinical Pharmacist [place] Please utilize Amion for appropriate phone number to reach the unit pharmacist [facility])  08/03/2023  9:22 PM

## 2023-08-03 NOTE — Progress Notes (Signed)
 Notified per charge RN that pt's HR is in 120-130's. Bp 108/69, HR 120-130's irregular, no history of Afib. Breathing even and unlabored in RA, denies any chest pain, o2 sat 96% in RA. Patient had just came back from restroom and was eating dinner, pt has been independently walking in the room all day without any discomfort. 12 lead EKG done, showed Afib/RVR. Dr. Briana paged at 1803, MD told me to page cardiology as it was new onset of Afib.  1812: cardiology DR. Donnel paged, received call back at 1817. Notified MD of the event, vitals sign reported. See MAR for new orders.  Pt resting in bed , breathing even and unlabored in RA. Denies any discomfort. Call bell in reach. Instructed patient to rest. Plan of care continues.

## 2023-08-03 NOTE — Plan of Care (Signed)
  Problem: Education: Goal: Knowledge of General Education information will improve Description: Including pain rating scale, medication(s)/side effects and non-pharmacologic comfort measures Outcome: Progressing   Problem: Health Behavior/Discharge Planning: Goal: Ability to manage health-related needs will improve Outcome: Progressing   Problem: Clinical Measurements: Goal: Diagnostic test results will improve Outcome: Progressing   Problem: Clinical Measurements: Goal: Will remain free from infection Outcome: Progressing   Problem: Clinical Measurements: Goal: Respiratory complications will improve Outcome: Progressing

## 2023-08-03 NOTE — Progress Notes (Signed)
 Paged for new onset atrial fibrillation with RVR and rates in 130s. BP 108/69. Patient stable and asymptomatic. Patient is on Heparin  IV for previously diagnosed acute PE. Complicated by significant pericardial effusion. Since cardiology already on board, will defer to cardiology recommendations for management of rate.  Elgin Lam, MD [facility] 08/03/2023, 6:09 PM

## 2023-08-03 NOTE — Hospital Course (Signed)
 Brendan [name] is a 68 y.o. male with a history of nonischemic cardiomyopathy, HFrecEF, rheumatoid arthritis, hyperlipidemia, ascending aortic aneurysm, hypothyroidism, thrombocytopenia.  Patient presented secondary to chest pain and found to have evidence of acute bilateral multifocal PE.  Patient was started on heparin  IV.  During workup, patient found to have evidence of a large pericardial effusion with concern for possible early tamponade.  Cardiology consulted and patient transferred to [facility] from [facility].

## 2023-08-03 NOTE — Progress Notes (Signed)
 PROGRESS NOTE    Brendan [name]  FMW:[id number] DOB: 05/03/1955 DOA: 08/01/2023 PCP: Bertell Satterfield, MD   Brief Narrative: Brendan [name] is a 68 y.o. male with a history of nonischemic cardiomyopathy, HFrecEF, rheumatoid arthritis, hyperlipidemia, ascending aortic aneurysm, hypothyroidism, thrombocytopenia.  Patient presented secondary to chest pain and found to have evidence of acute bilateral multifocal PE.  Patient was started on heparin  IV.  During workup, patient found to have evidence of a large pericardial effusion with concern for possible early tamponade.  Cardiology consulted and patient transferred to [facility] from [facility].   Assessment and Plan:  Acute bilateral multifocal PE Appears to be unprovoked.  Patient started on heparin  drip for management.  Echocardiogram obtained this admission which was significant for mildly reduced right ventricular systolic function with normal right ventricular size. -Continue Heparin  IV  Large pericardial effusion Concern for possible early tamponade. Cardiology consulted. -Cardiology recommendations: repeat Transthoracic Echocardiogram in 24 hours  HFrecEF LVEF of 55-60%. Currently euvolemic. Patient is on Entresto  and Coreg  as an outpatient, which have both been held this admission. Euvolemic.  Rheumatoid arthritis Patient is on Enbrel and prednisone  as an outpatient. -Continue prednisone   Hyperlipidemia -Continue   Hypothyroidism -Continue Synthroid   Obesity, class I Estimated body mass index is 31.19 kg/m as calculated from the following:   Height as of this encounter: 6' (1.829 m).   Weight as of this encounter: 104.3 kg.   DVT prophylaxis: Heparin  IV Code Status:   Code Status: Full Code Family Communication: None at bedside Disposition Plan: Discharge home pending transition to outpatient anticoagulation and ongoing cardiology recommendations/management   Consultants:   Cardiology  Procedures:  Transthoracic Echocardiogram  Antimicrobials: None   Subjective: Patient reports being able to lie flat and is breathing better. No pain.  Objective: BP 122/74 (BP Location: Left Arm)   Pulse 82   Temp 98.1 F (36.7 C) (Oral)   Resp 19   Ht 6' (1.829 m)   Wt 104.3 kg   SpO2 96%   BMI 31.19 kg/m   Examination:  General exam: Appears calm and comfortable Respiratory system: Clear to auscultation. Respiratory effort normal. Cardiovascular system: S1 & S2 heard, RRR. No murmurs, rubs, gallops or clicks. Gastrointestinal system: Abdomen is nondistended, soft and nontender. Normal bowel sounds heard. Psychiatry: Judgement and insight appear normal. Mood & affect appropriate.    Data Reviewed: I have personally reviewed following labs and imaging studies  CBC Lab Results  Component Value Date   WBC 8.8 08/03/2023   RBC 3.72 (L) 08/03/2023   HGB 11.9 (L) 08/03/2023   HCT 35.4 (L) 08/03/2023   MCV 95.2 08/03/2023   MCH 32.0 08/03/2023   PLT 129 (L) 08/03/2023   MCHC 33.6 08/03/2023   RDW 12.2 08/03/2023   LYMPHSABS 0.7 08/01/2023   MONOABS 0.7 08/01/2023   EOSABS 0.1 08/01/2023   BASOSABS 0.0 08/01/2023     Last metabolic panel Lab Results  Component Value Date   NA 133 (L) 08/03/2023   K 4.5 08/03/2023   CL 101 08/03/2023   CO2 19 (L) 08/03/2023   BUN 12 08/03/2023   CREATININE 0.83 08/03/2023   GLUCOSE 152 (H) 08/03/2023   GFRNONAA >60 08/03/2023   GFRAA >60 05/11/2019   CALCIUM  8.5 (L) 08/03/2023   PROT 6.4 (L) 08/02/2023   ALBUMIN 2.9 (L) 08/02/2023   LABGLOB 1.8 05/12/2023   AGRATIO 1.6 05/11/2019   BILITOT 1.6 (H) 08/02/2023   ALKPHOS 35 (L) 08/02/2023  AST 10 (L) 08/02/2023   ALT 15 08/02/2023   ANIONGAP 13 08/03/2023    GFR: Estimated Creatinine Clearance: 109.3 mL/min (by C-G formula based on SCr of 0.83 mg/dL).  No results found for this or any previous visit (from the past 240 hours).    Radiology  Studies: ECHOCARDIOGRAM COMPLETE Result Date: 08/02/2023    ECHOCARDIOGRAM REPORT   Patient Name:   Brendan [name] Date of Exam: 08/02/2023 Medical Rec #:  [id number]        Height:       72.0 in Accession #:    [id number]       Weight:       230.0 lb Date of Birth:  Jul 31, 1955        BSA:          2.261 m Patient Age:    68 years         BP:           119/74 mmHg Patient Gender: M                HR:           89 bpm. Exam Location:  [facility] Procedure: 2D Echo, Cardiac Doppler and Color Doppler (Both Spectral and Color            Flow Doppler were utilized during procedure). REPORT CONTAINS CRITICAL RESULT Indications:    R07.9* Chest pain, unspecified  History:        Patient has prior history of Echocardiogram examinations, most                 recent 06/22/2020. Previous Myocardial Infarction and CAD,                 Signs/Symptoms:Chest Pain; Risk Factors:Dyslipidemia. Pulmonary                 embolii.  Sonographer:    Ellouise Mose RDCS Referring Phys: [id number] ADRON BIRCH [facility]  Sonographer Comments: Technically difficult study due to poor echo windows. Image acquisition challenging due to patient body habitus. High fowler's position due to chest pain IMPRESSIONS  1. Left ventricular ejection fraction, by estimation, is 55 to 60%. The left ventricle has normal function. Left ventricular endocardial border not optimally defined to evaluate regional wall motion. There is mild concentric left ventricular hypertrophy. Left ventricular diastolic parameters are consistent with Grade I diastolic dysfunction (impaired relaxation).  2. Right ventricular systolic function is mildly reduced. The right ventricular size is normal. There is normal pulmonary artery systolic pressure. The estimated right ventricular systolic pressure is 24.0 mmHg.  3. Overall findings for tamponade are equivocal, but early tamponade is likely present. The inferior vena cava is only mildly dilated and collapses readily with inspiration. There is  no tachycardia. However, there may be intermittent diastgolic right ventricular collapse and there is enhanced respiratory variation in mitral flow. Large pericardial effusion. The pericardial effusion is circumferential.  4. The mitral valve is normal in structure. No evidence of mitral valve regurgitation. No evidence of mitral stenosis.  5. The aortic valve is tricuspid. Aortic valve regurgitation is not visualized. No aortic stenosis is present.  6. Aortic dilatation noted. There is mild dilatation of the ascending aorta, measuring 40 mm.  7. The inferior vena cava is dilated in size with >50% respiratory variability, suggesting right atrial pressure of 8 mmHg. Comparison(s): Prior images reviewed side by side. Changes from prior study are noted. The left ventricular function has improved. The  large pericardial effusion is new. FINDINGS  Left Ventricle: Left ventricular ejection fraction, by estimation, is 55 to 60%. The left ventricle has normal function. Left ventricular endocardial border not optimally defined to evaluate regional wall motion. The left ventricular internal cavity size was normal in size. There is mild concentric left ventricular hypertrophy. Left ventricular diastolic parameters are consistent with Grade I diastolic dysfunction (impaired relaxation). Right Ventricle: The right ventricular size is normal. No increase in right ventricular wall thickness. Right ventricular systolic function is mildly reduced. There is normal pulmonary artery systolic pressure. The tricuspid regurgitant velocity is 2.00 m/s, and with an assumed right atrial pressure of 8 mmHg, the estimated right ventricular systolic pressure is 24.0 mmHg. Left Atrium: Left atrial size was normal in size. Right Atrium: Right atrial size was normal in size. Pericardium: Overall findings for tamponade are equivocal, but early tamponade is likely present. The inferior vena cava is only mildly dilated and collapses readily with  inspiration. There is no tachycardia. However, there may be intermittent diastgolic  right ventricular collapse and there is enhanced respiratory variation in mitral flow. A large pericardial effusion is present. The pericardial effusion is circumferential. There is diastolic collapse of the right ventricular free wall and excessive respiratory variation in the mitral valve spectral Doppler velocities. Mitral Valve: The mitral valve is normal in structure. No evidence of mitral valve regurgitation. No evidence of mitral valve stenosis. Tricuspid Valve: The tricuspid valve is normal in structure. Tricuspid valve regurgitation is mild . No evidence of tricuspid stenosis. Aortic Valve: The aortic valve is tricuspid. Aortic valve regurgitation is not visualized. No aortic stenosis is present. Pulmonic Valve: The pulmonic valve was normal in structure. Pulmonic valve regurgitation is not visualized. No evidence of pulmonic stenosis. Aorta: Aortic dilatation noted. There is mild dilatation of the ascending aorta, measuring 40 mm. Venous: The inferior vena cava is dilated in size with greater than 50% respiratory variability, suggesting right atrial pressure of 8 mmHg. IAS/Shunts: No atrial level shunt detected by color flow Doppler.  LEFT VENTRICLE PLAX 2D LVIDd:         5.00 cm      Diastology LVIDs:         4.20 cm      LV e' medial:    4.68 cm/s LV PW:         1.50 cm      LV E/e' medial:  10.8 LV IVS:        1.40 cm      LV e' lateral:   4.79 cm/s LVOT diam:     2.50 cm      LV E/e' lateral: 10.5 LV SV:         72 LV SV Index:   32 LVOT Area:     4.91 cm  LV Volumes (MOD) LV vol d, MOD A2C: 116.0 ml LV vol d, MOD A4C: 70.5 ml LV vol s, MOD A2C: 29.9 ml LV vol s, MOD A4C: 26.5 ml LV SV MOD A2C:     86.1 ml LV SV MOD A4C:     70.5 ml LV SV MOD BP:      63.1 ml RIGHT VENTRICLE             IVC RV S prime:     11.10 cm/s  IVC diam: 2.20 cm TAPSE (M-mode): 1.1 cm LEFT ATRIUM             Index        RIGHT  ATRIUM            Index LA diam:        4.10 cm 1.81 cm/m   RA Area:     10.70 cm LA Vol (A2C):   25.5 ml 11.28 ml/m  RA Volume:   21.50 ml  9.51 ml/m LA Vol (A4C):   13.4 ml 5.93 ml/m LA Biplane Vol: 19.3 ml 8.54 ml/m  AORTIC VALVE LVOT Vmax:   97.10 cm/s LVOT Vmean:  62.300 cm/s LVOT VTI:    0.146 m  AORTA Ao Root diam: 3.60 cm Ao Asc diam:  4.05 cm MITRAL VALVE               TRICUSPID VALVE MV Area (PHT): 4.23 cm    TR Peak grad:   16.0 mmHg MV Decel Time: 179 msec    TR Vmax:        200.00 cm/s MV E velocity: 50.50 cm/s MV A velocity: 53.77 cm/s  SHUNTS MV E/A ratio:  0.94        Systemic VTI:  0.15 m                            Systemic Diam: 2.50 cm Mihai Croitoru MD Electronically signed by Jerel Balding MD Signature Date/Time: 08/02/2023/10:40:07 AM    Final    US  Venous Img Lower Bilateral (DVT) Result Date: 08/01/2023 CLINICAL DATA:  Scattered tiny bilateral pulmonary emboli, very low thrombus burden, leg pain EXAM: BILATERAL LOWER EXTREMITY VENOUS DOPPLER ULTRASOUND TECHNIQUE: Gray-scale sonography with graded compression, as well as color Doppler and duplex ultrasound were performed to evaluate the lower extremity deep venous systems from the level of the common femoral vein and including the common femoral, femoral, profunda femoral, popliteal and calf veins including the posterior tibial, peroneal and gastrocnemius veins when visible. Spectral Doppler was utilized to evaluate flow at rest and with distal augmentation maneuvers in the common femoral, femoral and popliteal veins. COMPARISON:  None Available. FINDINGS: RIGHT LOWER EXTREMITY Common Femoral Vein: No evidence of thrombus. Normal compressibility, respiratory phasicity and response to augmentation. Saphenofemoral Junction: No evidence of thrombus. Normal compressibility and flow on color Doppler imaging. Profunda Femoral Vein: No evidence of thrombus. Normal compressibility and flow on color Doppler imaging. Femoral Vein: No evidence of thrombus.  Normal compressibility, respiratory phasicity and response to augmentation. Popliteal Vein: No evidence of thrombus. Normal compressibility, respiratory phasicity and response to augmentation. Calf Veins: Limited visualization of the right posterior tibial peroneal veins. Difficult to exclude residual minor right calf region DVT. No propagation into the popliteal or femoral veins. LEFT LOWER EXTREMITY Common Femoral Vein: No evidence of thrombus. Normal compressibility, respiratory phasicity and response to augmentation. Saphenofemoral Junction: No evidence of thrombus. Normal compressibility and flow on color Doppler imaging. Profunda Femoral Vein: No evidence of thrombus. Normal compressibility and flow on color Doppler imaging. Femoral Vein: No evidence of thrombus. Normal compressibility, respiratory phasicity and response to augmentation. Popliteal Vein: No evidence of thrombus. Normal compressibility, respiratory phasicity and response to augmentation. Calf Veins: No evidence of thrombus. Normal compressibility and flow on color Doppler imaging. IMPRESSION: 1. No evidence of significant acute or occlusive lower extremity DVT. 2. Limited visualization of the right posterior tibial and peroneal veins. Difficult to exclude residual minor right calf region DVT. No propagation into the popliteal or femoral veins. Electronically Signed   By: CHRISTELLA.  Shick M.D.   On: 08/01/2023 15:00   CT Angio Chest  PE W/Cm &/Or Wo Cm Result Date: 08/01/2023 CLINICAL DATA:  Acute onset pleuritic chest pain EXAM: CT ANGIOGRAPHY CHEST WITH CONTRAST TECHNIQUE: Multidetector CT imaging of the chest was performed using the standard protocol during bolus administration of intravenous contrast. Multiplanar CT image reconstructions and MIPs were obtained to evaluate the vascular anatomy. RADIATION DOSE REDUCTION: This exam was performed according to the departmental dose-optimization program which includes automated exposure control,  adjustment of the mA and/or kV according to patient size and/or use of iterative reconstruction technique. CONTRAST:  75mL OMNIPAQUE  IOHEXOL  350 MG/ML SOLN COMPARISON:  Same day chest radiograph, CTA chest dated 08/19/2022, 07/30/2021 FINDINGS: Cardiovascular: The study is high quality for the evaluation of pulmonary embolism. Multifocal bilateral filling defects involving the right upper, middle, and lower lobes as well as lingula, most proximally within the right lower lobe proximal segmental pulmonary artery (5:161). Great vessels are normal in course and caliber. RV: LV ratio less than 1. Trace pericardial effusion. Coronary artery calcifications and aortic atherosclerosis. Mediastinum/Nodes: Imaged thyroid  gland without nodules meeting criteria for imaging follow-up by size. Normal esophagus. No pathologically enlarged axillary, supraclavicular, mediastinal, or hilar lymph nodes. Lungs/Pleura: The central airways are patent. Trace secretions within the trachea. Mild diffuse bronchial wall thickening. Mild paraseptal emphysema. Posterior right apical 5 x 4 mm nodule (6:43), basilar right lower lobe 3 mm nodule (6:85), 2 mm left upper lobe nodule (6:62), and peripheral 2 mm left lower lobe nodule (6:107), as well as 3 mm left lower lobe perifissural nodules (6:72) are unchanged dating back to 07/30/2021, likely benign. No specific follow-up imaging recommended. No pneumothorax. No pleural effusion. Upper abdomen: Normal. Musculoskeletal: No acute or abnormal lytic or blastic osseous lesions. Multilevel degenerative changes of the thoracic spine. Review of the MIP images confirms the above findings. IMPRESSION: 1. Multifocal bilateral pulmonary emboli involving the right upper, middle, and lower lobes as well as lingula, most proximally within the right lower lobe proximal segmental pulmonary artery. No evidence of right heart strain. 2. Mild peribronchial wall thickening, which may represent bronchitis. 3. Aortic  Atherosclerosis (ICD10-I70.0) and Emphysema (ICD10-J43.9). Coronary artery calcifications. Assessment for potential risk factor modification, dietary therapy or pharmacologic therapy may be warranted, if clinically indicated. Critical Value/emergent results were called by telephone at the time of interpretation on 08/01/2023 at 1:40 pm to Dr. Zammit, who verbally acknowledged these results. Electronically Signed   By: Limin  Xu M.D.   On: 08/01/2023 13:43      LOS: 1 day    Elgin Lam, MD [facility] 08/03/2023, 11:48 AM   If 7PM-7AM, please contact night-coverage [email or web address]

## 2023-08-03 NOTE — Plan of Care (Signed)

## 2023-08-03 NOTE — Progress Notes (Addendum)
 PHARMACY - ANTICOAGULATION CONSULT NOTE  Pharmacy Consult for heparin  Indication: pulmonary embolus  Allergies  Allergen Reactions   Simponi [Golimumab]     Itching, rash    Patient Measurements: Height: 6' (182.9 cm) Weight: 104.3 kg (230 lb) IBW/kg (Calculated) : 77.6 HEPARIN  DW (KG): 99.2  Vital Signs: Temp: 98.1 F (36.7 C) (06/29 0848) Temp Source: Oral (06/29 0848) BP: 122/74 (06/29 0848) Pulse Rate: 82 (06/29 0848)  Labs: Recent Labs    08/01/23 1111 08/01/23 1235 08/01/23 2105 08/02/23 0507 08/03/23 0319 08/03/23 1111  HGB 13.0  --   --  13.7 11.9*  --   HCT 39.0  --   --  40.8 35.4*  --   PLT 141*  --   --  157 129*  --   HEPARINUNFRC  --   --    < > 0.49 0.15* 0.10*  CREATININE 0.72  --   --  0.86 0.83  --   TROPONINIHS 5 4  --   --   --   --    < > = values in this interval not displayed.    Estimated Creatinine Clearance: 109.3 mL/min (by C-G formula based on SCr of 0.83 mg/dL).   Assessment: 68 YO male with medical history significant for HFpEF, ascending aortic aneurysm, and thrombocytopenia who presented to the ED with chest pain. Found to have acute bilateral multifocal PEs and TTE demonstrating large pericardial effusion with signs of early tamponade. Pharmacy consulted to dose heparin . Patient is not on anticoagulation prior to admission.   Heparin  levels previously therapeutic x2 on 1450 units/hr. AM heparin  level down. Per patient, the line 'kinked' overnight. RN checked line, currently running with no issues. Repeat heparin  level < 0.1. Discussed transitioning to enoxaparin  with primary and cardiology teams. Holding off on transition - per cardiology, patient may need a pericardiocentesis at some point and would be better to remain on heparin  for now.  Goal of Therapy:  Heparin  level 0.3-0.7 units/ml Monitor platelets by anticoagulation protocol: Yes   Plan:   Re-bolus with IV heparin  4000 units x1 Increase heparin  infusion to 1600  units/hr Check heparin  level in 6 hours and daily while on heparin  Continue to monitor H&H and platelets  Thank you for allowing pharmacy to be a part of this patient's care.  Shelba Collier, PharmD, BCPS Clinical Pharmacist

## 2023-08-04 ENCOUNTER — Other Ambulatory Visit (HOSPITAL_COMMUNITY): Payer: Self-pay

## 2023-08-04 ENCOUNTER — Other Ambulatory Visit: Payer: Self-pay | Admitting: Cardiology

## 2023-08-04 ENCOUNTER — Inpatient Hospital Stay (HOSPITAL_COMMUNITY)

## 2023-08-04 ENCOUNTER — Telehealth (HOSPITAL_COMMUNITY): Payer: Self-pay | Admitting: Pharmacy Technician

## 2023-08-04 DIAGNOSIS — I4891 Unspecified atrial fibrillation: Secondary | ICD-10-CM

## 2023-08-04 DIAGNOSIS — I2699 Other pulmonary embolism without acute cor pulmonale: Secondary | ICD-10-CM | POA: Diagnosis not present

## 2023-08-04 DIAGNOSIS — I308 Other forms of acute pericarditis: Secondary | ICD-10-CM

## 2023-08-04 DIAGNOSIS — I214 Non-ST elevation (NSTEMI) myocardial infarction: Secondary | ICD-10-CM

## 2023-08-04 DIAGNOSIS — I3139 Other pericardial effusion (noninflammatory): Secondary | ICD-10-CM

## 2023-08-04 DIAGNOSIS — D696 Thrombocytopenia, unspecified: Secondary | ICD-10-CM

## 2023-08-04 DIAGNOSIS — E782 Mixed hyperlipidemia: Secondary | ICD-10-CM

## 2023-08-04 DIAGNOSIS — I428 Other cardiomyopathies: Secondary | ICD-10-CM | POA: Diagnosis not present

## 2023-08-04 LAB — BASIC METABOLIC PANEL WITH GFR
Anion gap: 10 (ref 5–15)
BUN: 15 mg/dL (ref 8–23)
CO2: 24 mmol/L (ref 22–32)
Calcium: 8.5 mg/dL — ABNORMAL LOW (ref 8.9–10.3)
Chloride: 102 mmol/L (ref 98–111)
Creatinine, Ser: 0.85 mg/dL (ref 0.61–1.24)
GFR, Estimated: >60 mL/min
Glucose, Bld: 114 mg/dL — ABNORMAL HIGH (ref 70–99)
Potassium: 3.9 mmol/L (ref 3.5–5.1)
Sodium: 136 mmol/L (ref 135–145)

## 2023-08-04 LAB — CBC
HCT: 35.6 % — ABNORMAL LOW (ref 39.0–52.0)
Hemoglobin: 11.8 g/dL — ABNORMAL LOW (ref 13.0–17.0)
MCH: 31.3 pg (ref 26.0–34.0)
MCHC: 33.1 g/dL (ref 30.0–36.0)
MCV: 94.4 fL (ref 80.0–100.0)
Platelets: 148 10*3/uL — ABNORMAL LOW (ref 150–400)
RBC: 3.77 MIL/uL — ABNORMAL LOW (ref 4.22–5.81)
RDW: 12.1 % (ref 11.5–15.5)
WBC: 6.8 10*3/uL (ref 4.0–10.5)
nRBC: 0 % (ref 0.0–0.2)

## 2023-08-04 LAB — HEPARIN LEVEL (UNFRACTIONATED)
Heparin Unfractionated: 0.33 [IU]/mL (ref 0.30–0.70)
Heparin Unfractionated: 0.1 [IU]/mL — ABNORMAL LOW (ref 0.30–0.70)

## 2023-08-04 LAB — ECHOCARDIOGRAM LIMITED
Height: 72 in
S' Lateral: 4.4 cm
Weight: 3680 [oz_av]

## 2023-08-04 MED ORDER — COLCHICINE 0.6 MG PO TABS
0.6000 mg | ORAL_TABLET | Freq: Two times a day (BID) | ORAL | 2 refills | Status: DC
  Filled 2023-08-04: qty 60, 30d supply, fill #0

## 2023-08-04 MED ORDER — METOPROLOL TARTRATE 25 MG PO TABS
25.0000 mg | ORAL_TABLET | Freq: Two times a day (BID) | ORAL | 2 refills | Status: DC
  Filled 2023-08-04: qty 30, 15d supply, fill #0

## 2023-08-04 MED ORDER — APIXABAN 5 MG PO TABS
10.0000 mg | ORAL_TABLET | Freq: Two times a day (BID) | ORAL | Status: DC
  Administered 2023-08-04: 10 mg via ORAL
  Filled 2023-08-04: qty 2

## 2023-08-04 MED ORDER — APIXABAN 5 MG PO TABS
ORAL_TABLET | ORAL | 0 refills | Status: DC
  Filled 2023-08-04: qty 88, 37d supply, fill #0

## 2023-08-04 MED ORDER — METOPROLOL TARTRATE 25 MG PO TABS: 25.0000 mg | ORAL_TABLET | Freq: Two times a day (BID) | ORAL | Status: DC

## 2023-08-04 MED ORDER — APIXABAN 5 MG PO TABS: 5.0000 mg | ORAL_TABLET | Freq: Two times a day (BID) | ORAL | Status: DC

## 2023-08-04 NOTE — Progress Notes (Addendum)
 Progress Note  Patient Name: Brendan [name] Date of Encounter: 08/04/2023 [facility] Cardiologist: Alvan Carrier, MD   Interval Summary   Patient denies any chest pain, shortness of breath, palpitations Appears to be in sinus rhythm this morning with heart rate in the 70s to 80s  Just underwent updated echocardiogram, pending results No acute complaints per patient  Vital Signs Vitals:   08/03/23 1931 08/04/23 0020 08/04/23 0353 08/04/23 0744  BP: 106/73 102/61 93/71 119/73  Pulse: (!) 114 (!) 102 (!) 102 78  Resp:  18 17 19   Temp: 98.3 F (36.8 C) 98.3 F (36.8 C) 98.4 F (36.9 C) 97.8 F (36.6 C)  TempSrc: Oral Oral Oral Oral  SpO2: 95% 92% 94% 95%  Weight:      Height:        Intake/Output Summary (Last 24 hours) at 08/04/2023 0859 Last data filed at 08/04/2023 0800 Gross per 24 hour  Intake 950.83 ml  Output --  Net 950.83 ml      08/01/2023   10:47 AM 12/20/2022    2:27 PM 12/03/2022    2:56 PM  Last 3 Weights  Weight (lbs) 230 lb 237 lb 239 lb  Weight (kg) 104.327 kg 107.502 kg 108.41 kg     Telemetry/ECG  Sinus rhythm, HR 80s- Personally Reviewed  Physical Exam  GEN: No acute distress.   Neck: No JVD Cardiac: RRR, no murmurs, rubs, or gallops.  Respiratory: Clear to auscultation bilaterally. GI: Soft, nontender, non-distended  MS: No edema  Assessment & Plan  68 y.o. male with a hx of nonischemic cardiomyopathy with recovered EF, severe rheumatoid arthritis, hyperlipidemia, ascending aortic aneurysm, chronic thrombocytopenia, hypothyroidism who is being seen for the evaluation of pericardial effusion with early signs of tamponade after transferring from [facility].   Pericardial effusion Presented from [facility] for pericardial effusion with early signs of tamponade Echo 07/2023: LVEF 55 to 60%, G1 DD, mild RV dysfunction, large pericardial effusion, IVC collapses with inspiration, increased mitral inflow respiration  variation, questional intermittent diastolic RV changes Patient has not been tachycardic nor hypotensive during most of his admission ESR 64, CRP 32, pending ANA  Repeat echo pending  Currently on colchicine 0.6 mg BID   Acute pulmonary embolism CTPA noted multifocal bilateral PE, no evidence of right heart strain Currently on IV heparin  per primary  Chronic HFimpEF Echo 01/2018 showed EF 25 to 30%, cath showed no CAD Echo 06/2020 showed EF 50% Echo this admission showed EF of 55 to 60% No acute issues in regards to previously reduced EF  Acute atrial fibrillation with RVR  Found to be in atrial fibrillation with RVR, HR in 120s to 130s 08/03/2023 around 6 PM No prior history of A. Fib  Currently in sinus rhythm with HR 70-80s  Already on IV heparin  for acute PE as above Started on Lopressor  25 mg TID Pending repeat echocardiogram  Per primary  Acute bilateral multifocal pulmonary embolisms Rheumatoid arthritis Hyperlipidemia Hypothyroidism Obesity  [facility] will sign off.   Medication Recommendations: Eliquis 10 mg twice daily x 1 week then decrease to 5 mg twice daily, colchicine 0.6 mg twice daily (plan 77-month course for pericarditis), metoprolol  25 mg twice daily Other recommendations (labs, testing, etc): Recommend repeat echo in 1 month to monitor effusion Follow up as an outpatient: We will schedule    For questions or updates, please contact [facility] Please consult [email or web address] for contact info under  Signed, Waddell DELENA Donath, PA-C   Patient seen and examined.  Agree with above documentation.  On exam, patient is alert and oriented, regular rate and rhythm, no murmurs, lungs CTAB, 1+ LE edema, no JVD.  Developed A-fib with RVR with rates 120s to 130s yesterday.  Started on metoprolol .  He is back to sinus rhythm this morning.  Repeat echocardiogram today shows moderate pericardial effusion, no evidence of tamponade.  With improvement  in effusion, no plans for pericardiocentesis at this time, can transition from IV heparin  to Eliquis.  Continue colchicine.  Okay for discharge from cardiac standpoint, will arrange follow-up  Lonni LITTIE Nanas, MD

## 2023-08-04 NOTE — Progress Notes (Signed)
 Mobility Specialist Progress Note:    08/04/23 0948  Mobility  Activity Ambulated with assistance in room;Ambulated with assistance in hallway  Level of Assistance Modified independent, requires aide device or extra time  Assistive Device Other (Comment) (IV pole)  Distance Ambulated (ft) 960 ft  Activity Response Tolerated well  Mobility Referral Yes  Mobility visit 1 Mobility  Mobility Specialist Start Time (ACUTE ONLY) [id number]  Mobility Specialist Stop Time (ACUTE ONLY) 0954  Mobility Specialist Time Calculation (min) (ACUTE ONLY) 6 min   Pt received in bed, agreeable to mobility session. Ambulated in hallway with ModI using IV pole. Tolerated well, asx throughout. Returned pt to room, left with all needs met. RN notified.   Pre Mobility: HR 74 bpm During Mobility: 87 bpm Post Mobility: 70 bpm  Roseanna Koplin Mobility Specialist Please contact via Special educational needs teacher or  Rehab office at [phone]

## 2023-08-04 NOTE — Discharge Instructions (Addendum)
 Information on my medicine - ELIQUIS (apixaban)  This medication education was reviewed with me or my healthcare representative as part of my discharge preparation.    Why was Eliquis prescribed for you? Eliquis was prescribed to treat blood clots that may have been found in the veins of your legs (deep vein thrombosis) or in your lungs (pulmonary embolism) and to reduce the risk of them occurring again.  What do You need to know about Eliquis ? The starting dose is 10 mg (two 5 mg tablets) taken TWICE daily for the FIRST SEVEN (7) DAYS, then on (enter date)  08/11/23  the dose is reduced to ONE 5 mg tablet taken TWICE daily.  Eliquis may be taken with or without food.   Try to take the dose about the same time in the morning and in the evening. If you have difficulty swallowing the tablet whole please discuss with your pharmacist how to take the medication safely.  Take Eliquis exactly as prescribed and DO NOT stop taking Eliquis without talking to the doctor who prescribed the medication.  Stopping may increase your risk of developing a new blood clot.  Refill your prescription before you run out.  After discharge, you should have regular check-up appointments with your healthcare provider that is prescribing your Eliquis.    What do you do if you miss a dose? If a dose of ELIQUIS is not taken at the scheduled time, take it as soon as possible on the same day and twice-daily administration should be resumed. The dose should not be doubled to make up for a missed dose.  Important Safety Information A possible side effect of Eliquis is bleeding. You should call your healthcare provider right away if you experience any of the following: Bleeding from an injury or your nose that does not stop. Unusual colored urine (red or dark brown) or unusual colored stools (red or black). Unusual bruising for unknown reasons. A serious fall or if you hit your head (even if there is no  bleeding).  Some medicines may interact with Eliquis and might increase your risk of bleeding or clotting while on Eliquis. To help avoid this, consult your healthcare provider or pharmacist prior to using any new prescription or non-prescription medications, including herbals, vitamins, non-steroidal anti-inflammatory drugs (NSAIDs) and supplements.  This website has more information on Eliquis (apixaban): http://www.eliquis.com/eliquis/home

## 2023-08-04 NOTE — Telephone Encounter (Signed)
 Patient Product/process development scientist completed.    The patient is insured through [employer]. Patient has Medicare and is not eligible for a copay card, but may be able to apply for patient assistance or Medicare RX Payment Plan (Patient Must reach out to their plan, if eligible for payment plan), if available.    Ran test claim for Eliquis Starter Pack and the current 30 day co-pay is $0.00.   This test claim was processed through [facility]- copay amounts may vary at other pharmacies due to pharmacy/plan contracts, or as the patient moves through the different stages of their insurance plan.     Reyes Sharps, CPHT Pharmacy Technician III Certified Patient Advocate [facility] Patient Advocate Team Direct Number: [phone]  Fax: [phone]

## 2023-08-04 NOTE — Progress Notes (Signed)
   Order for outpatient echocardiogram to be completed in about one month to monitor pericardial effusion per MD.   Waddell DELENA Donath, PA-C 08/04/2023 1:01 PM

## 2023-08-04 NOTE — Discharge Summary (Signed)
 Physician Discharge Summary   Patient: Brendan [name] MRN: [id number] DOB: November 14, 1955  Admit date:     08/01/2023  Discharge date: {dischdate:26783}  Discharge Physician: Concepcion Riser   PCP: Bertell Satterfield, MD   Recommendations at discharge:  {Tip this will not be part of the note when signed- Example include specific recommendations for outpatient follow-up, pending tests to follow-up on. (Optional):26781}  ***  Discharge Diagnoses: Principal Problem:   Acute pulmonary embolism (HCC) Active Problems:   NSTEMI (non-ST elevated myocardial infarction) (HCC)   Non-ischemic cardiomyopathy (HCC)   Hyperlipidemia   Thrombocytopenia (HCC)   GERD (gastroesophageal reflux disease)   Pericardial effusion   Atrial fibrillation with RVR (HCC)  Resolved Problems:   * No resolved hospital problems. *  Hospital Course: Brendan [name] is a 68 y.o. male with a history of nonischemic cardiomyopathy, HFrecEF, rheumatoid arthritis, hyperlipidemia, ascending aortic aneurysm, hypothyroidism, thrombocytopenia.  Patient presented secondary to chest pain and found to have evidence of acute bilateral multifocal PE.  Patient was started on heparin  IV.  During workup, patient found to have evidence of a large pericardial effusion with concern for possible early tamponade.  Cardiology consulted and patient transferred to [facility] from [facility].  Assessment and Plan: No notes have been filed under this hospital service. Service: Hospitalist     {Tip this will not be part of the note when signed Body mass index is 31.19 kg/m. , ,  (Optional):26781}  {(NOTE) Pain control PDMP Statment (Optional):26782} Consultants: *** Procedures performed: ***  Disposition: {Plan; Disposition:26390} Diet recommendation:  Discharge Diet Orders (From admission, onward)     Start     Ordered   08/04/23 0000  Diet - low sodium heart healthy        08/04/23 1251            {Diet_Plan:26776} DISCHARGE MEDICATION: Allergies as of 08/04/2023       Reactions   Simponi [golimumab]    Itching, rash        Medication List     STOP taking these medications    carvedilol  3.125 MG tablet Commonly known as: COREG        TAKE these medications    apixaban 5 MG Tabs tablet Commonly known as: ELIQUIS Take 2 tablets (10 mg total) by mouth 2 (two) times daily for 7 days, THEN 1 tablet (5 mg total) 2 (two) times daily. Start taking on: August 04, 2023   atorvastatin  10 MG tablet Commonly known as: LIPITOR TAKE 1 TABLET BY MOUTH EVERY DAY   clobetasol 0.05 % external solution Commonly known as: TEMOVATE Apply 1 application topically 2 (two) times daily as needed (to affected areas of scalp).   colchicine 0.6 MG tablet Take 1 tablet (0.6 mg total) by mouth 2 (two) times daily.   Enbrel 50 MG/ML injection Generic drug: etanercept Inject 50 mg into the skin once a week.   Entresto  97-103 MG Generic drug: sacubitril -valsartan  Take 1 tablet by mouth 2 (two) times daily.   HYDROcodone -acetaminophen  5-325 MG tablet Commonly known as: NORCO/VICODIN Take 1 tablet by mouth every 12 (twelve) hours as needed for moderate pain.   levothyroxine  50 MCG tablet Commonly known as: SYNTHROID  Take 50 mcg by mouth daily before breakfast.   MegaRed Omega-3 Krill Oil 500 MG Caps Take 1 tablet by mouth daily.   metoprolol  tartrate 25 MG tablet Commonly known as: LOPRESSOR  Take 1 tablet (25 mg total) by mouth 2 (two) times daily.   nitroGLYCERIN   0.4 MG SL tablet Commonly known as: NITROSTAT  Place 1 tablet (0.4 mg total) under the tongue every 5 (five) minutes x 3 doses as needed for chest pain.   pantoprazole  40 MG tablet Commonly known as: PROTONIX  TAKE 1 TABLET BY MOUTH EVERY DAY   predniSONE  10 MG tablet Commonly known as: DELTASONE  Take 10-40 mg by mouth daily as needed ([place]).   Q-10 CO-ENZYME PO Take 1 tablet by mouth daily.   VITAMIN B 12  PO Take 1 tablet by mouth daily.   VITAMIN D  PO Take 1 tablet by mouth daily.        Discharge Exam: Filed Weights   08/01/23 1047  Weight: 104.3 kg   ***  Condition at discharge: {DC Condition:26389}  The results of significant diagnostics from this hospitalization (including imaging, microbiology, ancillary and laboratory) are listed below for reference.   Imaging Studies: ECHOCARDIOGRAM LIMITED Result Date: 08/04/2023    ECHOCARDIOGRAM LIMITED REPORT   Patient Name:   Brendan [name] Date of Exam: 08/04/2023 Medical Rec #:  [id number]        Height:       72.0 in Accession #:    [id number]       Weight:       230.0 lb Date of Birth:  [birth year]        BSA:          2.261 m Patient Age:    68 years         BP:           93/71 mmHg Patient Gender: M                HR:           80 bpm. Exam Location:  Inpatient Procedure: Limited Echo, Cardiac Doppler and Color Doppler (Both Spectral and            Color Flow Doppler were utilized during procedure). Indications:    Pericardial Effusion  History:        Patient has prior history of Echocardiogram examinations, most                 recent 08/02/2023. CHF; Pericardial Disease and Previous                 Myocardial Infarction.  Sonographer:    Jayson Gaskins Referring Phys: [id number] ROBIN FERNANDES IMPRESSIONS  1. Left ventricular ejection fraction, by estimation, is 60 to 65%. The left ventricle has normal function. The left ventricle has no regional wall motion abnormalities.  2. Right ventricular systolic function is normal. The right ventricular size is normal.  3. Moderate circumferential pericardial effusion measuring 1.6cm anteriorly and 1.9cm posteriorly. There is no RV/RA collapse; IVC is normal in caliber and collapsible. No signs of tamponade.  4. The mitral valve is normal in structure. No evidence of mitral valve regurgitation. No evidence of mitral stenosis.  5. The aortic valve is grossly normal. Aortic valve regurgitation is not  visualized. No aortic stenosis is present.  6. The inferior vena cava is normal in size with greater than 50% respiratory variability, suggesting right atrial pressure of 3 mmHg. FINDINGS  Left Ventricle: Left ventricular ejection fraction, by estimation, is 60 to 65%. The left ventricle has normal function. The left ventricle has no regional wall motion abnormalities. The left ventricular internal cavity size was normal in size. There is  no left ventricular hypertrophy. Right Ventricle: The right ventricular size is normal. No increase in  right ventricular wall thickness. Right ventricular systolic function is normal. Left Atrium: Left atrial size was normal in size. Right Atrium: Right atrial size was normal in size. Pericardium: A moderately sized pericardial effusion is present. The pericardial effusion is circumferential. The pericardial effusion appears to contain mixed echogenic material. Mitral Valve: The mitral valve is normal in structure. No evidence of mitral valve stenosis. Tricuspid Valve: The tricuspid valve is grossly normal. Tricuspid valve regurgitation is not demonstrated. No evidence of tricuspid stenosis. Aortic Valve: The aortic valve is grossly normal. Aortic valve regurgitation is not visualized. No aortic stenosis is present. Pulmonic Valve: The pulmonic valve was not assessed. Pulmonic valve regurgitation is not visualized. No evidence of pulmonic stenosis. Aorta: The aortic root is normal in size and structure. Venous: The inferior vena cava is normal in size with greater than 50% respiratory variability, suggesting right atrial pressure of 3 mmHg. IAS/Shunts: No atrial level shunt detected by color flow Doppler. LEFT VENTRICLE PLAX 2D LVIDd:         5.90 cm LVIDs:         4.40 cm LV PW:         1.10 cm LV IVS:        1.10 cm  Aditya Sabharwal Electronically signed by Ria Commander Signature Date/Time: 08/04/2023/9:05:30 AM    Final    ECHOCARDIOGRAM COMPLETE Result Date: 08/02/2023     ECHOCARDIOGRAM REPORT   Patient Name:   Brendan [name] Date of Exam: 08/02/2023 Medical Rec #:  [id number]        Height:       72.0 in Accession #:    [id number]       Weight:       230.0 lb Date of Birth:  1955/04/16        BSA:          2.261 m Patient Age:    68 years         BP:           119/74 mmHg Patient Gender: M                HR:           89 bpm. Exam Location:  [facility] Procedure: 2D Echo, Cardiac Doppler and Color Doppler (Both Spectral and Color            Flow Doppler were utilized during procedure). REPORT CONTAINS CRITICAL RESULT Indications:    R07.9* Chest pain, unspecified  History:        Patient has prior history of Echocardiogram examinations, most                 recent 06/22/2020. Previous Myocardial Infarction and CAD,                 Signs/Symptoms:Chest Pain; Risk Factors:Dyslipidemia. Pulmonary                 embolii.  Sonographer:    Ellouise Mose RDCS Referring Phys: [id number] ADRON BIRCH [facility]  Sonographer Comments: Technically difficult study due to poor echo windows. Image acquisition challenging due to patient body habitus. High fowler's position due to chest pain IMPRESSIONS  1. Left ventricular ejection fraction, by estimation, is 55 to 60%. The left ventricle has normal function. Left ventricular endocardial border not optimally defined to evaluate regional wall motion. There is mild concentric left ventricular hypertrophy. Left ventricular diastolic parameters are consistent with Grade I diastolic dysfunction (impaired relaxation).  2. Right ventricular  systolic function is mildly reduced. The right ventricular size is normal. There is normal pulmonary artery systolic pressure. The estimated right ventricular systolic pressure is 24.0 mmHg.  3. Overall findings for tamponade are equivocal, but early tamponade is likely present. The inferior vena cava is only mildly dilated and collapses readily with inspiration. There is no tachycardia. However, there may be intermittent diastgolic  right ventricular collapse and there is enhanced respiratory variation in mitral flow. Large pericardial effusion. The pericardial effusion is circumferential.  4. The mitral valve is normal in structure. No evidence of mitral valve regurgitation. No evidence of mitral stenosis.  5. The aortic valve is tricuspid. Aortic valve regurgitation is not visualized. No aortic stenosis is present.  6. Aortic dilatation noted. There is mild dilatation of the ascending aorta, measuring 40 mm.  7. The inferior vena cava is dilated in size with >50% respiratory variability, suggesting right atrial pressure of 8 mmHg. Comparison(s): Prior images reviewed side by side. Changes from prior study are noted. The left ventricular function has improved. The large pericardial effusion is new. FINDINGS  Left Ventricle: Left ventricular ejection fraction, by estimation, is 55 to 60%. The left ventricle has normal function. Left ventricular endocardial border not optimally defined to evaluate regional wall motion. The left ventricular internal cavity size was normal in size. There is mild concentric left ventricular hypertrophy. Left ventricular diastolic parameters are consistent with Grade I diastolic dysfunction (impaired relaxation). Right Ventricle: The right ventricular size is normal. No increase in right ventricular wall thickness. Right ventricular systolic function is mildly reduced. There is normal pulmonary artery systolic pressure. The tricuspid regurgitant velocity is 2.00 m/s, and with an assumed right atrial pressure of 8 mmHg, the estimated right ventricular systolic pressure is 24.0 mmHg. Left Atrium: Left atrial size was normal in size. Right Atrium: Right atrial size was normal in size. Pericardium: Overall findings for tamponade are equivocal, but early tamponade is likely present. The inferior vena cava is only mildly dilated and collapses readily with inspiration. There is no tachycardia. However, there may be  intermittent diastgolic  right ventricular collapse and there is enhanced respiratory variation in mitral flow. A large pericardial effusion is present. The pericardial effusion is circumferential. There is diastolic collapse of the right ventricular free wall and excessive respiratory variation in the mitral valve spectral Doppler velocities. Mitral Valve: The mitral valve is normal in structure. No evidence of mitral valve regurgitation. No evidence of mitral valve stenosis. Tricuspid Valve: The tricuspid valve is normal in structure. Tricuspid valve regurgitation is mild . No evidence of tricuspid stenosis. Aortic Valve: The aortic valve is tricuspid. Aortic valve regurgitation is not visualized. No aortic stenosis is present. Pulmonic Valve: The pulmonic valve was normal in structure. Pulmonic valve regurgitation is not visualized. No evidence of pulmonic stenosis. Aorta: Aortic dilatation noted. There is mild dilatation of the ascending aorta, measuring 40 mm. Venous: The inferior vena cava is dilated in size with greater than 50% respiratory variability, suggesting right atrial pressure of 8 mmHg. IAS/Shunts: No atrial level shunt detected by color flow Doppler.  LEFT VENTRICLE PLAX 2D LVIDd:         5.00 cm      Diastology LVIDs:         4.20 cm      LV e' medial:    4.68 cm/s LV PW:         1.50 cm      LV E/e' medial:  10.8 LV IVS:  1.40 cm      LV e' lateral:   4.79 cm/s LVOT diam:     2.50 cm      LV E/e' lateral: 10.5 LV SV:         72 LV SV Index:   32 LVOT Area:     4.91 cm  LV Volumes (MOD) LV vol d, MOD A2C: 116.0 ml LV vol d, MOD A4C: 70.5 ml LV vol s, MOD A2C: 29.9 ml LV vol s, MOD A4C: 26.5 ml LV SV MOD A2C:     86.1 ml LV SV MOD A4C:     70.5 ml LV SV MOD BP:      63.1 ml RIGHT VENTRICLE             IVC RV S prime:     11.10 cm/s  IVC diam: 2.20 cm TAPSE (M-mode): 1.1 cm LEFT ATRIUM             Index        RIGHT ATRIUM           Index LA diam:        4.10 cm 1.81 cm/m   RA Area:      10.70 cm LA Vol (A2C):   25.5 ml 11.28 ml/m  RA Volume:   21.50 ml  9.51 ml/m LA Vol (A4C):   13.4 ml 5.93 ml/m LA Biplane Vol: 19.3 ml 8.54 ml/m  AORTIC VALVE LVOT Vmax:   97.10 cm/s LVOT Vmean:  62.300 cm/s LVOT VTI:    0.146 m  AORTA Ao Root diam: 3.60 cm Ao Asc diam:  4.05 cm MITRAL VALVE               TRICUSPID VALVE MV Area (PHT): 4.23 cm    TR Peak grad:   16.0 mmHg MV Decel Time: 179 msec    TR Vmax:        200.00 cm/s MV E velocity: 50.50 cm/s MV A velocity: 53.77 cm/s  SHUNTS MV E/A ratio:  0.94        Systemic VTI:  0.15 m                            Systemic Diam: 2.50 cm Mihai Croitoru MD Electronically signed by Jerel Balding MD Signature Date/Time: 08/02/2023/10:40:07 AM    Final    US  Venous Img Lower Bilateral (DVT) Result Date: 08/01/2023 CLINICAL DATA:  Scattered tiny bilateral pulmonary emboli, very low thrombus burden, leg pain EXAM: BILATERAL LOWER EXTREMITY VENOUS DOPPLER ULTRASOUND TECHNIQUE: Gray-scale sonography with graded compression, as well as color Doppler and duplex ultrasound were performed to evaluate the lower extremity deep venous systems from the level of the common femoral vein and including the common femoral, femoral, profunda femoral, popliteal and calf veins including the posterior tibial, peroneal and gastrocnemius veins when visible. Spectral Doppler was utilized to evaluate flow at rest and with distal augmentation maneuvers in the common femoral, femoral and popliteal veins. COMPARISON:  None Available. FINDINGS: RIGHT LOWER EXTREMITY Common Femoral Vein: No evidence of thrombus. Normal compressibility, respiratory phasicity and response to augmentation. Saphenofemoral Junction: No evidence of thrombus. Normal compressibility and flow on color Doppler imaging. Profunda Femoral Vein: No evidence of thrombus. Normal compressibility and flow on color Doppler imaging. Femoral Vein: No evidence of thrombus. Normal compressibility, respiratory phasicity and response to  augmentation. Popliteal Vein: No evidence of thrombus. Normal compressibility, respiratory phasicity and response to augmentation. Calf  Veins: Limited visualization of the right posterior tibial peroneal veins. Difficult to exclude residual minor right calf region DVT. No propagation into the popliteal or femoral veins. LEFT LOWER EXTREMITY Common Femoral Vein: No evidence of thrombus. Normal compressibility, respiratory phasicity and response to augmentation. Saphenofemoral Junction: No evidence of thrombus. Normal compressibility and flow on color Doppler imaging. Profunda Femoral Vein: No evidence of thrombus. Normal compressibility and flow on color Doppler imaging. Femoral Vein: No evidence of thrombus. Normal compressibility, respiratory phasicity and response to augmentation. Popliteal Vein: No evidence of thrombus. Normal compressibility, respiratory phasicity and response to augmentation. Calf Veins: No evidence of thrombus. Normal compressibility and flow on color Doppler imaging. IMPRESSION: 1. No evidence of significant acute or occlusive lower extremity DVT. 2. Limited visualization of the right posterior tibial and peroneal veins. Difficult to exclude residual minor right calf region DVT. No propagation into the popliteal or femoral veins. Electronically Signed   By: CHRISTELLA.  Shick M.D.   On: 08/01/2023 15:00   CT Angio Chest PE W/Cm &/Or Wo Cm Result Date: 08/01/2023 CLINICAL DATA:  Acute onset pleuritic chest pain EXAM: CT ANGIOGRAPHY CHEST WITH CONTRAST TECHNIQUE: Multidetector CT imaging of the chest was performed using the standard protocol during bolus administration of intravenous contrast. Multiplanar CT image reconstructions and MIPs were obtained to evaluate the vascular anatomy. RADIATION DOSE REDUCTION: This exam was performed according to the departmental dose-optimization program which includes automated exposure control, adjustment of the mA and/or kV according to patient size and/or use  of iterative reconstruction technique. CONTRAST:  75mL OMNIPAQUE  IOHEXOL  350 MG/ML SOLN COMPARISON:  Same day chest radiograph, CTA chest dated 08/19/2022, 07/30/2021 FINDINGS: Cardiovascular: The study is high quality for the evaluation of pulmonary embolism. Multifocal bilateral filling defects involving the right upper, middle, and lower lobes as well as lingula, most proximally within the right lower lobe proximal segmental pulmonary artery (5:161). Great vessels are normal in course and caliber. RV: LV ratio less than 1. Trace pericardial effusion. Coronary artery calcifications and aortic atherosclerosis. Mediastinum/Nodes: Imaged thyroid  gland without nodules meeting criteria for imaging follow-up by size. Normal esophagus. No pathologically enlarged axillary, supraclavicular, mediastinal, or hilar lymph nodes. Lungs/Pleura: The central airways are patent. Trace secretions within the trachea. Mild diffuse bronchial wall thickening. Mild paraseptal emphysema. Posterior right apical 5 x 4 mm nodule (6:43), basilar right lower lobe 3 mm nodule (6:85), 2 mm left upper lobe nodule (6:62), and peripheral 2 mm left lower lobe nodule (6:107), as well as 3 mm left lower lobe perifissural nodules (6:72) are unchanged dating back to 07/30/2021, likely benign. No specific follow-up imaging recommended. No pneumothorax. No pleural effusion. Upper abdomen: Normal. Musculoskeletal: No acute or abnormal lytic or blastic osseous lesions. Multilevel degenerative changes of the thoracic spine. Review of the MIP images confirms the above findings. IMPRESSION: 1. Multifocal bilateral pulmonary emboli involving the right upper, middle, and lower lobes as well as lingula, most proximally within the right lower lobe proximal segmental pulmonary artery. No evidence of right heart strain. 2. Mild peribronchial wall thickening, which may represent bronchitis. 3. Aortic Atherosclerosis (ICD10-I70.0) and Emphysema (ICD10-J43.9). Coronary  artery calcifications. Assessment for potential risk factor modification, dietary therapy or pharmacologic therapy may be warranted, if clinically indicated. Critical Value/emergent results were called by telephone at the time of interpretation on 08/01/2023 at 1:40 pm to Dr. Zammit, who verbally acknowledged these results. Electronically Signed   By: Limin  Xu M.D.   On: 08/01/2023 13:43   DG Chest [facility]  Result Date: 08/01/2023 CLINICAL DATA:  Chest pain EXAM: PORTABLE CHEST 1 VIEW COMPARISON:  X-ray 01/15/2018.  CTA chest 08/19/2022 FINDINGS: Underinflation. Calcified aorta. Borderline cardiopericardial silhouette. No consolidation, pneumothorax or effusion. No edema. Overlapping cardiac leads. Degenerative changes along the spine. IMPRESSION: Underinflation. Borderline size heart. No acute cardiopulmonary disease. Electronically Signed   By: Ranell Bring M.D.   On: 08/01/2023 11:33    Microbiology: Results for orders placed or performed in visit on 04/25/20  Novel Coronavirus, NAA ([facility])     Status: None   Collection Time: 04/25/20  5:28 PM   Specimen: Nasopharyngeal(NP) swabs in vial transport medium   Nasopharynge  Result Value Ref Range Status   SARS-CoV-2, NAA Not Detected Not Detected Final    Comment: This nucleic acid amplification test was developed and its performance characteristics determined by [employer]. Nucleic acid amplification tests include RT-PCR and TMA. This test has not been FDA cleared or approved. This test has been authorized by FDA under an Emergency Use Authorization (EUA). This test is only authorized for the duration of time the declaration that circumstances exist justifying the authorization of the emergency use of in vitro diagnostic tests for detection of SARS-CoV-2 virus and/or diagnosis of COVID-19 infection under section 564(b)(1) of the Act, 21 U.S.C. 639aaa-6(a) (1), unless the authorization is terminated or revoked sooner. When  diagnostic testing is negative, the possibility of a false negative result should be considered in the context of a patient's recent exposures and the presence of clinical signs and symptoms consistent with COVID-19. An individual without symptoms of COVID-19 and who is not shedding SARS-CoV-2 virus wo uld expect to have a negative (not detected) result in this assay.   SARS-COV-2, NAA 2 DAY TAT     Status: None   Collection Time: 04/25/20  5:28 PM   Nasopharynge  Result Value Ref Range Status   SARS-CoV-2, NAA 2 DAY TAT Performed  Final    Labs: CBC: Recent Labs  Lab 08/01/23 1111 08/02/23 0507 08/03/23 0319 08/04/23 0403  WBC 7.5 10.8* 8.8 6.8  NEUTROABS 6.1  --   --   --   HGB 13.0 13.7 11.9* 11.8*  HCT 39.0 40.8 35.4* 35.6*  MCV 94.4 96.9 95.2 94.4  PLT 141* 157 129* 148*   Basic Metabolic Panel: Recent Labs  Lab 08/01/23 1111 08/02/23 0507 08/03/23 0319 08/04/23 0403  NA 136 136 133* 136  K 3.6 4.2 4.5 3.9  CL 102 102 101 102  CO2 23 23 19* 24  GLUCOSE 187* 136* 152* 114*  BUN 13 14 12 15   CREATININE 0.72 0.86 0.83 0.85  CALCIUM  8.6* 8.6* 8.5* 8.5*  MG  --  2.0 2.2  --    Liver Function Tests: Recent Labs  Lab 08/01/23 1111 08/02/23 0507  AST 14* 10*  ALT 17 15  ALKPHOS 38 35*  BILITOT 0.9 1.6*  PROT 6.3* 6.4*  ALBUMIN 3.0* 2.9*   CBG: No results for input(s): GLUCAP in the last 168 hours.  Discharge time spent: {LESS THAN/GREATER UYJW:73611} 30 minutes.  Signed: Concepcion Riser, MD [facility] 08/04/2023

## 2023-08-04 NOTE — Progress Notes (Signed)
   08/04/23 1309  TOC Brief Assessment  Insurance and Status Reviewed  Patient has primary care physician Yes  Home environment has been reviewed home  Prior level of function: self  Prior/Current Home Services No current home services  Social Drivers of Health Review SDOH reviewed no interventions necessary  Readmission risk has been reviewed Yes  Transition of care needs no transition of care needs at this time    Pt stable for transition home today, plan for Eliquis- per benefits check- copay zero dollars. No HH or DME needs noted.

## 2023-08-04 NOTE — Progress Notes (Signed)
 PHARMACY - ANTICOAGULATION CONSULT NOTE  Pharmacy Consult for heparin  Indication: pulmonary embolus  Allergies  Allergen Reactions   Simponi [Golimumab]     Itching, rash    Patient Measurements: Height: 6' (182.9 cm) Weight: 104.3 kg (230 lb) IBW/kg (Calculated) : 77.6 HEPARIN  DW (KG): 99.2  Vital Signs: Temp: 97.8 F (36.6 C) (06/30 0744) Temp Source: Oral (06/30 0744) BP: 119/73 (06/30 0744) Pulse Rate: 78 (06/30 0744)  Labs: Recent Labs    08/01/23 1111 08/01/23 1235 08/01/23 2105 08/02/23 0507 08/03/23 0319 08/03/23 1111 08/03/23 2037 08/04/23 0403  HGB 13.0  --   --  13.7 11.9*  --   --  11.8*  HCT 39.0  --   --  40.8 35.4*  --   --  35.6*  PLT 141*  --   --  157 129*  --   --  148*  HEPARINUNFRC  --   --    < > 0.49 0.15* 0.10* 0.27* 0.33  CREATININE 0.72  --   --  0.86 0.83  --   --  0.85  TROPONINIHS 5 4  --   --   --   --   --   --    < > = values in this interval not displayed.    Estimated Creatinine Clearance: 106.8 mL/min (by C-G formula based on SCr of 0.85 mg/dL).   Assessment: 68 YO male with medical history significant for HFpEF, ascending aortic aneurysm, and thrombocytopenia who presented to the ED with chest pain. Found to have acute bilateral multifocal PEs and TTE demonstrating large pericardial effusion with signs of early tamponade. Pharmacy consulted to dose heparin . Patient is not on anticoagulation prior to admission.   Heparin  levels previously therapeutic x2 on 1450 units/hr. AM heparin  level down. Per patient, the line 'kinked' overnight. RN checked line, currently running with no issues. Repeat heparin  level < 0.1. Discussed transitioning to enoxaparin  with primary and cardiology teams. Holding off on transition - per cardiology, patient may need a pericardiocentesis at some point and would be better to remain on heparin  for now.  Heparin  level 0.33 this AM after rate increase to 1750 units/hr.  No issues per RN.   Goal of  Therapy:  Heparin  level 0.3-0.7 units/ml Monitor platelets by anticoagulation protocol: Yes   Plan:   Continue heparin  infusion at 1750 units/hr Check confirmatory heparin  level in 6 hours and daily while on heparin  Continue to monitor H&H and platelets  Aleenah Homen A. Lyle, PharmD, BCPS, FNKF Clinical Pharmacist [place] Please utilize Amion for appropriate phone number to reach the unit pharmacist [facility])  Addendum: MD wises to transition to Apixaban at this point. Will continue heparin  until first dose is given. Apixaban 10mg  BID x 7 days, then 5mg  BID. Will contact RX advocate for copay info.   Aqsa Sensabaugh A. Lyle, PharmD, BCPS, FNKF Clinical Pharmacist [place] Please utilize Amion for appropriate phone number to reach the unit pharmacist [facility])  08/04/2023  10:34 AM

## 2023-08-05 LAB — ANTINUCLEAR ANTIBODIES, IFA: ANA Ab, IFA: NEGATIVE

## 2023-08-05 LAB — RHEUMATOID FACTOR: Rheumatoid fact SerPl-aCnc: >650 [IU]/mL — ABNORMAL HIGH (ref <14.0)

## 2023-08-07 LAB — HIV-1/HIV-2 QUALITATIVE RNA
Final Interpretation: NEGATIVE
HIV-1 RNA, Qualitative: NONREACTIVE
HIV-2 RNA, Qualitative: NONREACTIVE

## 2023-08-07 LAB — HIV-1/2 AB - DIFFERENTIATION
HIV 1 Ab: NONREACTIVE
HIV 2 Ab: NONREACTIVE
Note: NEGATIVE

## 2023-08-11 ENCOUNTER — Other Ambulatory Visit: Payer: Self-pay | Admitting: Cardiology

## 2023-08-12 NOTE — Progress Notes (Signed)
 Cardiology Office Note:  .   Date:  08/18/2023  ID:  Brendan [name], DOB 08/12/1955, MRN [id number] PCP: Bertell Satterfield, MD  [facility] Providers Cardiologist:  Alvan Carrier, MD    History of Present Illness: Brendan [name] is a 68 y.o. male with a hx of nonischemic cardiomyopathy with recovered EF, severe rheumatoid arthritis, hyperlipidemia, ascending aortic aneurysm, chronic thrombocytopenia, hypothyroidism   Patient was seen for the evaluation of pericardial effusion with early signs of tamponade after transferring from [facility].  Also had acute multifocal bilateral PE treated with IV heparin .  Patient also went into atrial fibs with RVR and was started on metoprolol .  Repeat limited echo 08/04/2023 normal LVEF with moderate circumferential pericardial effusion 1.6 cm anteriorly 1.9 cm posteriorly no RV/RA collapse, no sign of tamponade.  Patient here for hospital f/u. Denies chest pain, dyspnea, dizzy palpitations. Has a feeling lightheadedness but no dizziness or presyncope. Wondering if it's some of his heart meds. Has some leg swelling during the day but goes down by the am. Getting some salt but tries to watch.    ROS:    Studies Reviewed: SABRA         Prior CV Studies:   2D echo 08/04/2023 IMPRESSIONS     1. Left ventricular ejection fraction, by estimation, is 60 to 65%. The  left ventricle has normal function. The left ventricle has no regional  wall motion abnormalities.   2. Right ventricular systolic function is normal. The right ventricular  size is normal.   3. Moderate circumferential pericardial effusion measuring 1.6cm  anteriorly and 1.9cm posteriorly. There is no RV/RA collapse; IVC is  normal in caliber and collapsible. No signs of tamponade.   4. The mitral valve is normal in structure. No evidence of mitral valve  regurgitation. No evidence of mitral stenosis.   5. The aortic valve is grossly normal. Aortic valve regurgitation  is not  visualized. No aortic stenosis is present.   6. The inferior vena cava is normal in size with greater than 50%  respiratory variability, suggesting right atrial pressure of 3 mmHg.   Pericardium: A moderately sized pericardial effusion is present. The  pericardial effusion is circumferential. The pericardial effusion appears  to contain mixed echogenic material.  2D echo 08/02/2023 IMPRESSIONS     1. Left ventricular ejection fraction, by estimation, is 55 to 60%. The  left ventricle has normal function. Left ventricular endocardial border  not optimally defined to evaluate regional wall motion. There is mild  concentric left ventricular  hypertrophy. Left ventricular diastolic parameters are consistent with  Grade I diastolic dysfunction (impaired relaxation).   2. Right ventricular systolic function is mildly reduced. The right  ventricular size is normal. There is normal pulmonary artery systolic  pressure. The estimated right ventricular systolic pressure is 24.0 mmHg.   3. Overall findings for tamponade are equivocal, but early tamponade is  likely present. The inferior vena cava is only mildly dilated and  collapses readily with inspiration. There is no tachycardia. However,  there may be intermittent diastgolic right  ventricular collapse and there is enhanced respiratory variation in mitral  flow. Large pericardial effusion. The pericardial effusion is  circumferential.   4. The mitral valve is normal in structure. No evidence of mitral valve  regurgitation. No evidence of mitral stenosis.   5. The aortic valve is tricuspid. Aortic valve regurgitation is not  visualized. No aortic stenosis is present.   6. Aortic  dilatation noted. There is mild dilatation of the ascending  aorta, measuring 40 mm.   7. The inferior vena cava is dilated in size with >50% respiratory  variability, suggesting right atrial pressure of 8 mmHg.   Comparison(s): Prior images reviewed side  by side. Changes from prior  study are noted. The left ventricular function has improved. The large  pericardial effusion is new.   Risk Assessment/Calculations:    CHA2DS2-VASc Score = 2   This indicates a 2.2% annual risk of stroke. The patient's score is based upon: CHF History: 1 HTN History: 0 Diabetes History: 0 Stroke History: 0 Vascular Disease History: 0 Age Score: 1 Gender Score: 0            Physical Exam:   VS:  BP 115/68   Pulse 74   Ht 6' (1.829 m)   Wt 229 lb 12.8 oz (104.2 kg)   SpO2 95%   BMI 31.17 kg/m    Orhtostatics: No data found. Wt Readings from Last 3 Encounters:  08/18/23 229 lb 12.8 oz (104.2 kg)  08/01/23 230 lb (104.3 kg)  12/20/22 237 lb (107.5 kg)    GEN: Well nourished, well developed in no acute distress NECK: No JVD; No carotid bruits CARDIAC:  RRR, no murmurs, rubs, gallops RESPIRATORY:  Clear to auscultation without rales, wheezing or rhonchi  ABDOMEN: Soft, non-tender, non-distended EXTREMITIES:  No edema; No deformity   ASSESSMENT AND PLAN: .    Pericardial effusion Presented from [facility] for pericardial effusion with early signs of tamponade Echo 07/2023: LVEF 55 to 60%, G1 DD, mild RV dysfunction, large pericardial effusion, IVC collapses with inspiration, increased mitral inflow respiration variation, questional intermittent diastolic RV changes Patient has not been tachycardic nor hypotensive during most of his admission ESR 64, CRP 32, pending ANA  -Repeat echo 08/04/2023 moderate size pericardial effusion no evidence of tamponade. -Currently on colchicine  0.6 mg BID-continue for 3 months -repeat echo scheduled in August-will switch to Zelda Salmon for his convenience   Acute pulmonary embolism CTPA noted multifocal bilateral PE, no evidence of right heart strain Currently on Eliquis -refill He has hematologist he should discuss if needs long term eliquis  for unprovoked PE-appt next week   Chronic HFimpEF Echo  01/2018 showed EF 25 to 30%, cath showed no CAD Echo 06/2020 showed EF 50% Echo this admission showed EF of 55 to 60% -continue entresto , metoprolol (coreg  stopped with afib)   Acute atrial fibrillation with RVR  Found to be in atrial fibrillation with RVR, HR in 120s to 130s 08/03/2023 around 6 PM No prior history of A. Fib  Currently RRR today  -Continue Eliquis  -continue Lopressor  25 mg TID          Dispo: f/u Dr. Alvan in [place] 3 months  Signed, Olivia Pavy, PA-C

## 2023-08-18 ENCOUNTER — Ambulatory Visit: Attending: Physician Assistant | Admitting: Physician Assistant

## 2023-08-18 ENCOUNTER — Other Ambulatory Visit (HOSPITAL_COMMUNITY): Payer: Self-pay

## 2023-08-18 VITALS — BP 115/68 | HR 74 | Ht 72.0 in | Wt 229.8 lb

## 2023-08-18 DIAGNOSIS — I3139 Other pericardial effusion (noninflammatory): Secondary | ICD-10-CM

## 2023-08-18 DIAGNOSIS — I5032 Chronic diastolic (congestive) heart failure: Secondary | ICD-10-CM | POA: Diagnosis not present

## 2023-08-18 DIAGNOSIS — I2699 Other pulmonary embolism without acute cor pulmonale: Secondary | ICD-10-CM

## 2023-08-18 DIAGNOSIS — I4891 Unspecified atrial fibrillation: Secondary | ICD-10-CM

## 2023-08-18 MED ORDER — APIXABAN 5 MG PO TABS
5.0000 mg | ORAL_TABLET | Freq: Two times a day (BID) | ORAL | 3 refills | Status: AC
Start: 2023-08-18 — End: ?

## 2023-08-18 MED ORDER — METOPROLOL TARTRATE 25 MG PO TABS: 25.0000 mg | ORAL_TABLET | Freq: Two times a day (BID) | ORAL | 3 refills | Status: AC

## 2023-08-18 MED ORDER — COLCHICINE 0.6 MG PO TABS: 0.6000 mg | ORAL_TABLET | Freq: Two times a day (BID) | ORAL | 3 refills | Status: DC

## 2023-08-18 NOTE — Patient Instructions (Signed)
 Medication Instructions:  Your physician recommends that you continue on your current medications as directed. Please refer to the Current Medication list given to you today.  *If you need a refill on your cardiac medications before your next appointment, please call your pharmacy*  Lab Work: None ordered  If you have labs (blood work) drawn today and your tests are completely normal, you will receive your results only by: MyChart Message (if you have MyChart) OR A paper copy in the mail If you have any lab test that is abnormal or we need to change your treatment, we will call you to review the results.  Testing/Procedures: None ordered today but will see if we can switch the Echocardiogram to be done in [place]  Follow-Up: At [facility], you and your health needs are our priority.  As part of our continuing mission to provide you with exceptional heart care, our providers are all part of one team.  This team includes your primary Cardiologist (physician) and Advanced Practice Providers or APPs (Physician Assistants and Nurse Practitioners) who all work together to provide you with the care you need, when you need it.  Your next appointment:   2 month(s)  Provider:   Dorn Ross, MD    We recommend signing up for the patient portal called MyChart.  Sign up information is provided on this After Visit Summary.  MyChart is used to connect with patients for Virtual Visits (Telemedicine).  Patients are able to view lab/test results, encounter notes, upcoming appointments, etc.  Non-urgent messages can be sent to your provider as well.   To learn more about what you can do with MyChart, go to [email or web address].   Other Instructions

## 2023-08-21 DIAGNOSIS — M057A Rheumatoid arthritis with rheumatoid factor of other specified site without organ or systems involvement: Secondary | ICD-10-CM | POA: Diagnosis not present

## 2023-08-21 DIAGNOSIS — M1990 Unspecified osteoarthritis, unspecified site: Secondary | ICD-10-CM | POA: Diagnosis not present

## 2023-09-01 DIAGNOSIS — E039 Hypothyroidism, unspecified: Secondary | ICD-10-CM | POA: Diagnosis not present

## 2023-09-01 DIAGNOSIS — G894 Chronic pain syndrome: Secondary | ICD-10-CM | POA: Diagnosis not present

## 2023-09-01 DIAGNOSIS — M069 Rheumatoid arthritis, unspecified: Secondary | ICD-10-CM | POA: Diagnosis not present

## 2023-09-01 DIAGNOSIS — X32XXXD Exposure to sunlight, subsequent encounter: Secondary | ICD-10-CM | POA: Diagnosis not present

## 2023-09-01 DIAGNOSIS — M1991 Primary osteoarthritis, unspecified site: Secondary | ICD-10-CM | POA: Diagnosis not present

## 2023-09-01 DIAGNOSIS — R7309 Other abnormal glucose: Secondary | ICD-10-CM | POA: Diagnosis not present

## 2023-09-01 DIAGNOSIS — I2699 Other pulmonary embolism without acute cor pulmonale: Secondary | ICD-10-CM | POA: Diagnosis not present

## 2023-09-01 DIAGNOSIS — L570 Actinic keratosis: Secondary | ICD-10-CM | POA: Diagnosis not present

## 2023-09-01 DIAGNOSIS — I5022 Chronic systolic (congestive) heart failure: Secondary | ICD-10-CM | POA: Diagnosis not present

## 2023-09-01 DIAGNOSIS — E6609 Other obesity due to excess calories: Secondary | ICD-10-CM | POA: Diagnosis not present

## 2023-09-01 DIAGNOSIS — Z6831 Body mass index (BMI) 31.0-31.9, adult: Secondary | ICD-10-CM | POA: Diagnosis not present

## 2023-09-01 DIAGNOSIS — G9332 Myalgic encephalomyelitis/chronic fatigue syndrome: Secondary | ICD-10-CM | POA: Diagnosis not present

## 2023-09-01 DIAGNOSIS — D518 Other vitamin B12 deficiency anemias: Secondary | ICD-10-CM | POA: Diagnosis not present

## 2023-09-16 ENCOUNTER — Ambulatory Visit (HOSPITAL_COMMUNITY)
Admission: RE | Admit: 2023-09-16 | Discharge: 2023-09-16 | Disposition: A | Discharge status: Home / Self Care | Source: Ambulatory Visit | Attending: Cardiology | Admitting: Cardiology

## 2023-09-16 ENCOUNTER — Other Ambulatory Visit (HOSPITAL_COMMUNITY)

## 2023-09-16 DIAGNOSIS — I3139 Other pericardial effusion (noninflammatory): Secondary | ICD-10-CM | POA: Diagnosis not present

## 2023-09-16 LAB — ECHOCARDIOGRAM COMPLETE
AR max vel: 3.08 cm2
AV Area VTI: 3.32 cm2
AV Area mean vel: 3.59 cm2
AV Mean grad: 2.3 mmHg
AV Peak grad: 4.8 mmHg
Ao pk vel: 1.1 m/s
Area-P 1/2: 1.82 cm2
S' Lateral: 4.4 cm

## 2023-10-30 ENCOUNTER — Ambulatory Visit: Payer: Self-pay | Admitting: Cardiology

## 2023-10-30 NOTE — Telephone Encounter (Signed)
 Notified via detailed voice message, copy to pcp.

## 2023-10-30 NOTE — Telephone Encounter (Signed)
-----   Message from Alvan Carrier sent at 10/30/2023  1:53 PM EDT ----- Echo shows heart pumping function is normal. The fluid collection that had been around his heart had gone away which is good new  JINNY Alvan MD ----- Message ----- From: Interface, Three One Seven Sent: 09/16/2023   3:18 PM EDT To: Carrier JULIANNA Alvan, MD

## 2023-10-31 ENCOUNTER — Inpatient Hospital Stay: Attending: Oncology

## 2023-10-31 DIAGNOSIS — E538 Deficiency of other specified B group vitamins: Secondary | ICD-10-CM | POA: Insufficient documentation

## 2023-10-31 DIAGNOSIS — D696 Thrombocytopenia, unspecified: Secondary | ICD-10-CM | POA: Diagnosis not present

## 2023-10-31 DIAGNOSIS — E559 Vitamin D deficiency, unspecified: Secondary | ICD-10-CM | POA: Insufficient documentation

## 2023-10-31 LAB — CBC WITH DIFFERENTIAL/PLATELET
Abs Immature Granulocytes: 0.01 K/uL (ref 0.00–0.07)
Basophils Absolute: 0 K/uL (ref 0.0–0.1)
Basophils Relative: 1 %
Eosinophils Absolute: 0.1 K/uL (ref 0.0–0.5)
Eosinophils Relative: 1 %
HCT: 40.9 % (ref 39.0–52.0)
Hemoglobin: 13.5 g/dL (ref 13.0–17.0)
Immature Granulocytes: 0 %
Lymphocytes Relative: 12 %
Lymphs Abs: 0.8 K/uL (ref 0.7–4.0)
MCH: 29.5 pg (ref 26.0–34.0)
MCHC: 33 g/dL (ref 30.0–36.0)
MCV: 89.3 fL (ref 80.0–100.0)
Monocytes Absolute: 0.6 K/uL (ref 0.1–1.0)
Monocytes Relative: 9 %
Neutro Abs: 5.2 K/uL (ref 1.7–7.7)
Neutrophils Relative %: 77 %
Platelets: 172 K/uL (ref 150–400)
RBC: 4.58 MIL/uL (ref 4.22–5.81)
RDW: 14.2 % (ref 11.5–15.5)
WBC: 6.7 K/uL (ref 4.0–10.5)
nRBC: 0 % (ref 0.0–0.2)

## 2023-10-31 LAB — COMPREHENSIVE METABOLIC PANEL WITH GFR
ALT: 24 U/L (ref 0–44)
AST: 15 U/L (ref 15–41)
Albumin: 2.9 g/dL — ABNORMAL LOW (ref 3.5–5.0)
Alkaline Phosphatase: 38 U/L (ref 38–126)
Anion gap: 11 (ref 5–15)
BUN: 12 mg/dL (ref 8–23)
CO2: 23 mmol/L (ref 22–32)
Calcium: 8.8 mg/dL — ABNORMAL LOW (ref 8.9–10.3)
Chloride: 103 mmol/L (ref 98–111)
Creatinine, Ser: 0.77 mg/dL (ref 0.61–1.24)
GFR, Estimated: >60 mL/min (ref >60)
Glucose, Bld: 144 mg/dL — ABNORMAL HIGH (ref 70–99)
Potassium: 3.7 mmol/L (ref 3.5–5.1)
Sodium: 137 mmol/L (ref 135–145)
Total Bilirubin: 0.8 mg/dL (ref 0.0–1.2)
Total Protein: 6.4 g/dL — ABNORMAL LOW (ref 6.5–8.1)

## 2023-10-31 LAB — LACTATE DEHYDROGENASE: LDH: 105 U/L (ref 98–192)

## 2023-10-31 LAB — VITAMIN B12: Vitamin B-12: 670 pg/mL (ref 180–914)

## 2023-10-31 LAB — VITAMIN D 25 HYDROXY (VIT D DEFICIENCY, FRACTURES): Vit D, 25-Hydroxy: 61.66 ng/mL (ref 30–100)

## 2023-11-04 DIAGNOSIS — X32XXXD Exposure to sunlight, subsequent encounter: Secondary | ICD-10-CM | POA: Diagnosis not present

## 2023-11-04 DIAGNOSIS — L570 Actinic keratosis: Secondary | ICD-10-CM | POA: Diagnosis not present

## 2023-11-05 LAB — METHYLMALONIC ACID, SERUM: Methylmalonic Acid, Quantitative: 257 nmol/L (ref 0–378)

## 2023-11-06 ENCOUNTER — Encounter: Payer: Self-pay | Admitting: Cardiology

## 2023-11-06 ENCOUNTER — Ambulatory Visit: Attending: Cardiology | Admitting: Cardiology

## 2023-11-06 VITALS — BP 120/72 | HR 60 | Ht 72.0 in | Wt 235.4 lb

## 2023-11-06 DIAGNOSIS — I5020 Unspecified systolic (congestive) heart failure: Secondary | ICD-10-CM

## 2023-11-06 DIAGNOSIS — I4891 Unspecified atrial fibrillation: Secondary | ICD-10-CM

## 2023-11-06 DIAGNOSIS — I3139 Other pericardial effusion (noninflammatory): Secondary | ICD-10-CM

## 2023-11-06 DIAGNOSIS — D6869 Other thrombophilia: Secondary | ICD-10-CM

## 2023-11-06 MED ORDER — FUROSEMIDE 20 MG PO TABS: 20.0000 mg | ORAL_TABLET | ORAL | 2 refills | Status: DC | PRN
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
## 2023-11-06 NOTE — Patient Instructions (Addendum)
 Medication Instructions:   Stop Colchicine   Begin Lasix  20mg  as needed for swelling  Continue all other medications.     Labwork:  none  Testing/Procedures:  none  Follow-Up:  6 months   Any Other Special Instructions Will Be Listed Below (If Applicable).   If you need a refill on your cardiac medications before your next appointment, please call your pharmacy.

## 2023-11-07 ENCOUNTER — Inpatient Hospital Stay: Attending: Oncology | Admitting: Oncology

## 2023-11-07 VITALS — BP 128/70 | HR 59 | Temp 98.1°F | Resp 19 | Wt 237.0 lb

## 2023-11-07 DIAGNOSIS — Z7901 Long term (current) use of anticoagulants: Secondary | ICD-10-CM | POA: Diagnosis not present

## 2023-11-07 DIAGNOSIS — M069 Rheumatoid arthritis, unspecified: Secondary | ICD-10-CM

## 2023-11-07 DIAGNOSIS — E559 Vitamin D deficiency, unspecified: Secondary | ICD-10-CM | POA: Insufficient documentation

## 2023-11-07 DIAGNOSIS — M0600 Rheumatoid arthritis without rheumatoid factor, unspecified site: Secondary | ICD-10-CM | POA: Diagnosis not present

## 2023-11-07 DIAGNOSIS — I4891 Unspecified atrial fibrillation: Secondary | ICD-10-CM | POA: Insufficient documentation

## 2023-11-07 DIAGNOSIS — Z86711 Personal history of pulmonary embolism: Secondary | ICD-10-CM | POA: Diagnosis not present

## 2023-11-07 DIAGNOSIS — E538 Deficiency of other specified B group vitamins: Secondary | ICD-10-CM | POA: Insufficient documentation

## 2023-11-07 DIAGNOSIS — I2699 Other pulmonary embolism without acute cor pulmonale: Secondary | ICD-10-CM

## 2023-11-07 DIAGNOSIS — I723 Aneurysm of iliac artery: Secondary | ICD-10-CM | POA: Insufficient documentation

## 2023-11-07 DIAGNOSIS — D696 Thrombocytopenia, unspecified: Secondary | ICD-10-CM | POA: Diagnosis not present

## 2023-11-07 NOTE — Assessment & Plan Note (Addendum)
 Recently hospitalized for PE bilaterally.  He was put on heparin  drip and discharged home on Eliquis .  He saw cardiology yesterday and they have continued his Eliquis  BID for now.

## 2023-11-07 NOTE — Patient Instructions (Signed)
 [facility] at [facility] **VISIT SUMMARY & IMPORTANT INSTRUCTIONS **    You were seen today by Delon Hope for thrombocytopenia, B12 deficiency and vitamin D  deficiency.   PLAN SUMMARY:  Continue vitamin B12, vitamin D  supplements.         1. Thrombocytopenia (Primary) Resolved 2. Vitamin B12 deficiency disease Continue B12 supplements  3. Vitamin D  deficiency Continue vitamin D  supplements.  4. Rheumatoid arthritis, involving unspecified site, unspecified whether rheumatoid factor present [facility]) Continue care under rheumatology.    LABS: Return in 6 months   OTHER TESTS: None   MEDICATIONS: None   FOLLOW-UP APPOINTMENT: 6 months  ** Thank you for trusting me with your healthcare!  I strive to provide all of my patients with quality care at each visit.  If you receive a survey for this visit, I would be so grateful to you for taking the time to provide feedback.  Thank you in advance!                                          Dr. Mickiel Dry        Pleasant Barefoot, PA-C          Delon Hope, NP    - - - - - - - - - - - - - - - - - -      Thank you for choosing [facility] at [facility] to provide your oncology and hematology care.  To afford each patient quality time with our provider, please arrive at least 15 minutes before your scheduled appointment time.    If you have a lab appointment with the Cancer Center please come in thru the Main Entrance and check in at the main information desk.   You need to re-schedule your appointment should you arrive 10 or more minutes late.  We strive to give you quality time with our providers, and arriving late affects you and other patients whose appointments are after yours.  Also, if you no show three or more times for appointments you may be dismissed from the clinic at the providers discretion.     Again, thank you for choosing [facility].  Our hope is that  these requests will decrease the amount of time that you wait before being seen by our physicians.       _____________________________________________________________   Should you have questions after your visit to [facility], please contact our office at [phone] and follow the prompts.  Our office hours are 8:00 a.m. and 4:30 p.m. Monday - Friday.  Please note that voicemails left after 4:00 p.m. may not be returned until the following business day.  We are closed weekends and major holidays.  You do have access to a nurse 24-7, just call the main number to the clinic [phone] and do not press any options, hold on the line and a nurse will answer the phone.     For prescription refill requests, have your pharmacy contact our office and allow 72 hours.

## 2023-11-07 NOTE — Progress Notes (Signed)
 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 Workup was essentially negative. Kevzara stopped working back in September and Brendan [name] was switched to Tyenne infusions which she states have not started working yet.  Reports Brendan [name] takes prednisone  each day and has narcotics for breakthrough pain. Abdominal ultrasound from 06/22/2019 showed hepatic steatosis with a normal spleen. Most recent labs from 10/31/2023 shows unremarkable CBC with normal hemoglobin and platelet count. Etiology felt to be secondary to medication induced thrombocytopenia from Kevzara/Tyenne versus immune mediated. Discussed bone marrow biopsy should deviate from baseline. Return to clinic in 6 months with [name] and labs a few days Vitamin B12 deficiency disease She continues oral B12 supplements. Labs from 10/31/2023 shows an MMA of 257 with a B12 of 670. Continue B12 supplements. Vitamin D  deficiency Vitamin D  level has improved to 61.66. She is taking vitamin D  supplements.  Recommend Brendan [name] continue for now. Rheumatoid arthritis, involving unspecified site, unspecified whether rheumatoid factor present [facility]) Brendan [name] is followed by rheumatology. Brendan [name] continues to get infusions with rheumatology.   Current regimen is with  Other acute pulmonary embolism, unspecified whether acute cor pulmonale present [facility]) Recently hospitalized for PE bilaterally.  Brendan [name] was put on heparin  drip and discharged home on Eliquis .  Brendan [name] saw cardiology yesterday and they have continued his Eliquis  BID for now.  Orders Placed This Encounter  Procedures   CBC with Differential    Standing Status:   Future    Expected Date:   05/07/2024    Expiration Date:   11/06/2024   Comprehensive metabolic panel    Standing Status:   Future    Expected Date:   05/07/2024    Expiration Date:   11/06/2024   Vitamin D  25 hydroxy     Standing Status:   Future    Expected Date:   05/07/2024    Expiration Date:   11/06/2024   Lactate dehydrogenase    Standing Status:   Future    Expected Date:   05/07/2024    Expiration Date:   11/06/2024   Vitamin B12    Standing Status:   Future    Expected Date:   05/07/2024    Expiration Date:   11/06/2024   Methylmalonic acid, serum    Standing Status:   Future    Expected Date:   05/07/2024    Expiration Date:   11/06/2024    INTERVAL HISTORY: Brendan [name].  Brendan [name] is a 68 year old [name] who is here for thrombocytopenia.  Brendan [name] was recently hospitalized from 08/01/2023 through 08/04/2023 for bilateral multifocal PE and was started on heparin  IV.  Brendan [name] was found to have large pericardial effusion with concern for possible early tamponade.  Brendan [name] had a TEE and echocardiogram.  Brendan [name] is on metoprolol  for A-fib with RVR.  Brendan [name] was discharged on Eliquis  twice daily which Brendan [name] is stable on.  Denies any bleeding or easy bruising.  There is no known history of blood clots prior to this event.   Reports Brendan [name] is currently having a severe RA flare.  Is affecting his hands, wrists and right leg specifically.  Reports sometimes his RA medications work and sometimes they do not.  Reports pain 7 out of 10 and had trouble walking up to our clinic today.  Brendan [name] is currently on etanercept 50 mg/mL subcu once a week which appears to be  working moderately.  Brendan [name] reports fairly significant pain in his fingers and his right leg from his knee down.  Brendan [name] also takes hydrocodone  every 8 hours as needed.   Compliant with vitamin D  and B12 oral supplements.  Brendan [name] continues to have RA type pain that rotates in severity and location.  Brendan [name] reports persistent easy bruising but denies any bleeding incidents.  Has occasional night sweats for years.  Denies any unintentional weight loss, fevers or chills.     We reviewed CBC, CMP, B12, MMA, LDH and vitamin D .  SUMMARY OF HEMATOLOGIC HISTORY: Oncology History   No history exists.      CBC    Component Value Date/Time   WBC 6.7 10/31/2023 1042   RBC 4.58 10/31/2023 1042   HGB 13.5 10/31/2023 1042   HGB 15.0 05/12/2023 1443   HCT 40.9 10/31/2023 1042   HCT 43.0 05/12/2023 1443   PLT 172 10/31/2023 1042   PLT 111 (L) 05/12/2023 1443   MCV 89.3 10/31/2023 1042   MCV 96 05/12/2023 1443   MCH 29.5 10/31/2023 1042   MCHC 33.0 10/31/2023 1042   RDW 14.2 10/31/2023 1042   RDW 14.2 05/12/2023 1443   LYMPHSABS 0.8 10/31/2023 1042   LYMPHSABS 0.6 (L) 05/12/2023 1443   MONOABS 0.6 10/31/2023 1042   EOSABS 0.1 10/31/2023 1042   EOSABS 0.0 05/12/2023 1443   BASOSABS 0.0 10/31/2023 1042   BASOSABS 0.0 05/12/2023 1443       Latest Ref Rng & Units 10/31/2023   10:42 AM 08/04/2023    4:03 AM 08/03/2023    3:19 AM  CMP  Glucose 70 - 99 mg/dL 855  885  847   BUN 8 - 23 mg/dL 12  15  12    Creatinine 0.61 - 1.24 mg/dL 9.22  9.14  9.16   Sodium 135 - 145 mmol/L 137  136  133   Potassium 3.5 - 5.1 mmol/L 3.7  3.9  4.5   Chloride 98 - 111 mmol/L 103  102  101   CO2 22 - 32 mmol/L 23  24  19    Calcium  8.9 - 10.3 mg/dL 8.8  8.5  8.5   Total Protein 6.5 - 8.1 g/dL 6.4     Total Bilirubin 0.0 - 1.2 mg/dL 0.8     Alkaline Phos 38 - 126 U/L 38     AST 15 - 41 U/L 15     ALT 0 - 44 U/L 24        Lab Results  Component Value Date   FERRITIN 302 05/12/2023   VITAMINB12 670 10/31/2023    Vitals:   11/07/23 1039  BP: 128/70  Pulse: (!) 59  Resp: 19  Temp: 98.1 F (36.7 C)  SpO2: 100%    Review of System:  Review of Systems  Constitutional:  Positive for malaise/fatigue.  Cardiovascular:  Positive for leg swelling (Right lower leg).  Gastrointestinal:  Positive for blood in stool. Negative for constipation and diarrhea.  Musculoskeletal:  Positive for joint pain.  Skin:  Negative for itching and rash.    Physical Exam: Physical Exam Constitutional:      Appearance: Normal appearance.  HENT:     Head: Normocephalic and atraumatic.  Eyes:     Pupils:  Pupils are equal, round, and reactive to light.  Cardiovascular:     Rate and Rhythm: Normal rate and regular rhythm.     Heart sounds: Normal heart sounds. No murmur heard. Pulmonary:     Effort:  Pulmonary effort is normal.     Breath sounds: Normal breath sounds. No wheezing.  Abdominal:     General: Bowel sounds are normal. There is no distension.     Palpations: Abdomen is soft.     Tenderness: There is no abdominal tenderness.  Musculoskeletal:        General: Normal range of motion.     Right wrist: Bony tenderness present.     Left wrist: Bony tenderness present.     Right hand: Swelling present.     Left hand: Swelling present.     Cervical back: Normal range of motion.     Right knee: Swelling present.     Left knee: Normal.     Right lower leg: Swelling present.     Left lower leg: Normal.  Skin:    General: Skin is warm and dry.     Findings: No rash.  Neurological:     Mental Status: Brendan [name] is alert and oriented to person, place, and time.     Gait: Gait is intact.  Psychiatric:        Mood and Affect: Mood and affect normal.        Cognition and Memory: Memory normal.        Judgment: Judgment normal.      I spent 25 minutes dedicated to Brendan care of this Brendan [name] (face-to-face and non-face-to-face) on Brendan date of Brendan encounter to include what is described in Brendan assessment and plan.,  Delon Hope, NP 11/10/2023 1:20 PM

## 2023-11-07 NOTE — Assessment & Plan Note (Addendum)
 He is followed by rheumatology. He continues to get infusions with rheumatology.   Current regimen is with

## 2023-11-07 NOTE — Assessment & Plan Note (Addendum)
 Vitamin D  level has improved to 61.66. She is taking vitamin D  supplements.  Recommend he continue for now.

## 2023-11-07 NOTE — Assessment & Plan Note (Addendum)
 She continues oral B12 supplements. Labs from 10/31/2023 shows an MMA of 257 with a B12 of 670. Continue B12 supplements.

## 2023-11-07 NOTE — Assessment & Plan Note (Addendum)
    Mild to moderate thrombocytopenia since December 2019. Workup was essentially negative. Kevzara stopped working back in September and he was switched to Tyenne infusions which she states have not started working yet.  Reports he takes prednisone  each day and has narcotics for breakthrough pain. Abdominal ultrasound from 06/22/2019 showed hepatic steatosis with a normal spleen. Most recent labs from 10/31/2023 shows unremarkable CBC with normal hemoglobin and platelet count. Etiology felt to be secondary to medication induced thrombocytopenia from Kevzara/Tyenne versus immune mediated. Discussed bone marrow biopsy should deviate from baseline. Return to clinic in 6 months with follow-up and labs a few days

## 2023-11-11 DIAGNOSIS — M057A Rheumatoid arthritis with rheumatoid factor of other specified site without organ or systems involvement: Secondary | ICD-10-CM | POA: Diagnosis not present

## 2023-11-11 DIAGNOSIS — Z79899 Other long term (current) drug therapy: Secondary | ICD-10-CM | POA: Diagnosis not present

## 2023-11-11 DIAGNOSIS — M1380 Other specified arthritis, unspecified site: Secondary | ICD-10-CM | POA: Diagnosis not present

## 2023-11-11 DIAGNOSIS — Z23 Encounter for immunization: Secondary | ICD-10-CM | POA: Diagnosis not present

## 2023-11-27 ENCOUNTER — Other Ambulatory Visit: Payer: Self-pay | Admitting: Cardiology

## 2023-12-03 ENCOUNTER — Ambulatory Visit: Admitting: Gastroenterology

## 2023-12-03 ENCOUNTER — Other Ambulatory Visit: Payer: Self-pay

## 2023-12-03 ENCOUNTER — Encounter: Payer: Self-pay | Admitting: Gastroenterology

## 2023-12-03 VITALS — BP 113/67 | HR 60 | Temp 98.6°F | Ht 72.0 in | Wt 235.4 lb

## 2023-12-03 DIAGNOSIS — K760 Fatty (change of) liver, not elsewhere classified: Secondary | ICD-10-CM | POA: Diagnosis not present

## 2023-12-03 DIAGNOSIS — K219 Gastro-esophageal reflux disease without esophagitis: Secondary | ICD-10-CM | POA: Diagnosis not present

## 2023-12-03 NOTE — Progress Notes (Signed)
 GI Office Note    Referring Provider:  Primary Care Physician:  no PCP  Primary Gastroenterologist: Ozell Hollingshead, MD  Chief Complaint   Chief Complaint  Patient presents with   Follow-up    Follow up on GERD and fatty liver    History of Present Illness   Brendan [name] is a 68 y.o. male presenting today for follow up GERD, fatty liver, elevated LFTs. Last seen 11/2022.   Admission back in June with acute bilateral multifocal PE, unprovoked.  Large pericardial effusion noted, initially with concern for possible early tamponade.  Repeat echocardiogram with improved effusion, cardiology advising colchicine  for 3 months.  Patient reports developed some diarrhea related to medication.  While inpatient he also developed new onset A-fib with RVR.  From a GI standpoint he is having a little bit of a flare of his reflux since changing his medication.  He states that his PCP prior to retiring had switched his pantoprazole  to cimetidine and his a atorvastatin  to ezetimibe due to concerns for potential muscle pain, per patient routine medications may work together and reduce muscle pain which he had began having.  Currently he is supplementing with Tums.  No vomiting or dysphagia.    He has a history of fatty liver. He drinks alcohol infrequently now, about once or twice a week, and was a daily user many years ago. His liver function tests were normal in September.  BMs regular. No melena, brbpr. Due 10 year screening colonoscopy next year.   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 Spleen normal in size.  Proximal abdominal aortic aneurysm measuring 3 cm recommending ultrasound every 3 years.   EGD 12/2018: moderately severe erosive reflux esophagitis with peptic stricture s/p dilation, medium sized hh, gastric and duodenal erosions. Gastric bx with gastritis but no h.pylori.    Colonoscopy 2016, hyperplastic polyp removed. Diverticulosis. Next colonoscopy in 2026.   Medications   Current Outpatient Medications  Medication Sig Dispense Refill   apixaban  (ELIQUIS ) 5 MG TABS tablet Take 1 tablet (5 mg total) by mouth 2 (two) times daily. 180 tablet 3   cimetidine (TAGAMET) 400 MG tablet Take 400 mg by mouth daily.     clobetasol (TEMOVATE) 0.05 % external solution Apply 1 application topically 2 (two) times daily as needed (to affected areas of scalp).      Coenzyme Q10 (Q-10 CO-ENZYME PO) Take 1 tablet by mouth daily.     Cyanocobalamin  (VITAMIN B 12 PO) Take 1 tablet by mouth daily.     ENTRESTO  97-103 MG TAKE 1 TABLET BY MOUTH TWICE A DAY 60 tablet 6   ezetimibe (ZETIA) 10 MG tablet Take 10 mg by mouth daily.     furosemide  (LASIX ) 20 MG tablet Take 1 tablet (20 mg total) by mouth as needed for edema (swelling). 30 tablet 2   HYDROcodone -acetaminophen  (NORCO/VICODIN) 5-325 MG tablet Take  1 tablet by mouth every 12 (twelve) hours as needed for moderate pain.     levothyroxine  (SYNTHROID ) 50 MCG tablet Take 50 mcg by mouth daily before breakfast.     MEGARED OMEGA-3 KRILL OIL 500 MG CAPS Take 1 tablet by mouth daily.     metoprolol  tartrate (LOPRESSOR ) 25 MG tablet Take 1 tablet (25 mg total) by mouth 2 (two) times daily. 180 tablet 3   nitroGLYCERIN  (NITROSTAT ) 0.4 MG SL tablet Place 1 tablet (0.4 mg total) under the tongue every 5 (five) minutes x 3 doses as needed for chest pain.  25 tablet 0   predniSONE  (DELTASONE ) 10 MG tablet Take 10-40 mg by mouth daily as needed (Flares).     tacrolimus (PROTOPIC) 0.1 % ointment Apply topically 2 (two) times daily.     Tocilizumab (ACTEMRA) 162 MG/0.9ML SOSY Inject into the skin.     VITAMIN D  PO Take 1 tablet by mouth daily.     etanercept (ENBREL) 50 MG/ML injection Inject 50 mg into the skin once a week. (Patient not taking: Reported on 12/03/2023)     No current facility-administered medications for this visit.    Allergies   Allergies as of 12/03/2023 - Review Complete 12/03/2023  Allergen Reaction Noted   Simponi [golimumab]  01/15/2018       Review of Systems   General: Negative for anorexia, weight loss, fever, chills, fatigue, weakness. Eyes: Negative for vision changes.  ENT: Negative for hoarseness, difficulty swallowing, nasal congestion. CV: Negative for chest pain, angina, palpitations, dyspnea on exertion, peripheral edema.  Respiratory: Negative for dyspnea at rest, dyspnea on exertion, cough, sputum, wheezing.  GI: See history of present illness. GU:  Negative for dysuria, hematuria, urinary incontinence, urinary frequency, nocturnal urination.  MS: + for joint pain,neg low back pain.  Derm: Negative for rash or itching.  Neuro: Negative for weakness, abnormal sensation, seizure, frequent headaches, memory loss,  confusion.  Psych: Negative for anxiety, depression, suicidal ideation, hallucinations.  Endo: Negative for unusual weight change.  Heme: Negative for bruising or bleeding. Allergy: Negative for rash or hives.  Physical Exam   BP 113/67   Pulse 60   Temp 98.6 F (37 C)   Ht 6' (1.829 m)   Wt 235 lb 6.4 oz (106.8 kg)   BMI 31.93 kg/m    General: chronically ill appearing male in no acute distress.  Head: Normocephalic, atraumatic.   Eyes: Conjunctiva pink, no icterus. Mouth: Oropharyngeal mucosa moist and pink  Neck: Supple without thyromegaly, masses, or lymphadenopathy.   Abdomen: Bowel sounds are normal, nontender, nondistended, no hepatosplenomegaly or masses,  no abdominal bruits or hernia, no rebound or guarding.   Rectal: not performed Extremities: 2+ lower extremity edema. No clubbing or deformities.  Neuro: Alert and oriented x 4 , grossly normal neurologically.  Skin: Warm and dry, no rash or jaundice.   Psych: Alert and cooperative, normal mood and affect.  Labs   See above  Imaging Studies   No results found.  Assessment/Plan:   GERD: -some break through symptoms on cimetidine. Educated regarding tachyphylaxis, may require drug holiday. He is currently supplementing with TUMS prn.  -Continue to monitor, if symptoms worse, will go back on PPI.  -reinforced antireflux measures -call if needs PPI  Chronic intermittent elevated LFTs/fatty liver: MASLD but cannot exclude MetALD (Metabolic and Alcohol-Associated Liver Disease) -LFTs normal, FIB4 1.17 -Median kPa 3.3 on elastography 05/2023 -avoid all etoh -limit salt/sodium to 2000mg  daily -CBC, CMET, PT/INR, Iron/TBC/Ferritin, PETH, ELF  02/2024   Colon cancer screening: -due 05/2024 -plan to see him back in six months and will consider colonoscopy at that time. Will need an office visit due to history of PEs, cardiovascular disease.        [name], MHS, PA-C [facility]

## 2023-12-03 NOTE — Patient Instructions (Signed)
 We will reach out closer to 02/2024 and remind you to have blood work completed regarding your fatty liver. Please avoid alcohol, due to your fatty liver disease.  Limit salt/sodium to no more than 2000 mg daily. If you decide your reflux is not well controlled, please call and we will send in pantoprazole . Plan to return in six months, we will discuss possible colonoscopy at that time if you are ready.

## 2023-12-04 ENCOUNTER — Other Ambulatory Visit: Payer: Self-pay

## 2023-12-04 DIAGNOSIS — K760 Fatty (change of) liver, not elsewhere classified: Secondary | ICD-10-CM

## 2023-12-04 DIAGNOSIS — R7989 Other specified abnormal findings of blood chemistry: Secondary | ICD-10-CM

## 2023-12-04 DIAGNOSIS — D696 Thrombocytopenia, unspecified: Secondary | ICD-10-CM

## 2024-01-21 DIAGNOSIS — X32XXXD Exposure to sunlight, subsequent encounter: Secondary | ICD-10-CM | POA: Diagnosis not present

## 2024-01-21 DIAGNOSIS — L570 Actinic keratosis: Secondary | ICD-10-CM | POA: Diagnosis not present

## 2024-01-21 DIAGNOSIS — L308 Other specified dermatitis: Secondary | ICD-10-CM | POA: Diagnosis not present

## 2024-02-01 ENCOUNTER — Other Ambulatory Visit: Payer: Self-pay | Admitting: Cardiology

## 2024-03-02 ENCOUNTER — Telehealth: Payer: Self-pay | Admitting: Cardiology

## 2024-03-02 MED ORDER — LEVOTHYROXINE SODIUM 50 MCG PO TABS: 50.0000 ug | ORAL_TABLET | Freq: Every day | ORAL | 0 refills | Status: AC

## 2024-03-02 NOTE — Telephone Encounter (Signed)
" °*  STAT* If patient is at the pharmacy, call can be transferred to refill team.   1. Which medications need to be refilled? (please list name of each medication and dose if known) levothyroxine  (SYNTHROID ) 50 MCG tablet    2. Would you like to learn more about the convenience, safety, & potential cost savings by using the [facility]?    3. Are you open to using the [facility] (Type [facility].    4. Which pharmacy/location (including street and city if local pharmacy) is medication to be sent to? [id number] - , [place] - [address] AT [address]     5. Do they need a 30 day or 90 day supply? 90     Patient is requesting for medication refill. Patient states his PCP usually prescribes this medication but is no longer active  "

## 2024-03-02 NOTE — Telephone Encounter (Signed)
 Advised that levothyroxine  is typically not prescribed by cardiology. Reports that his PCP retired and he has not established with a new PCP. Gave names of all PCP offices in [facility] and advised that he needed to contact their office asap to get established. Advised that message would be sent to provider per his request. Reports he has 15 levothyroxine  tablets on hand.

## 2024-05-14 ENCOUNTER — Other Ambulatory Visit

## 2024-05-20 ENCOUNTER — Inpatient Hospital Stay: Admitting: Oncology

## 2024-05-21 ENCOUNTER — Telehealth: Admitting: Oncology

## 2024-05-26 ENCOUNTER — Ambulatory Visit: Admitting: Cardiology
